# Patient Record
Sex: Female | Born: 1965
Health system: Southern US, Community
[De-identification: ages and names within clinical notes are randomized; demographics above are authoritative.]

## PROBLEM LIST (undated history)

## (undated) ENCOUNTER — Encounter

## (undated) ENCOUNTER — Telehealth

## (undated) ENCOUNTER — Ambulatory Visit
Payer: MEDICARE | Attending: Student in an Organized Health Care Education/Training Program | Primary: Student in an Organized Health Care Education/Training Program

## (undated) ENCOUNTER — Ambulatory Visit

## (undated) ENCOUNTER — Ambulatory Visit: Payer: MEDICARE

## (undated) ENCOUNTER — Encounter: Attending: Critical Care Medicine | Primary: Critical Care Medicine

## (undated) ENCOUNTER — Ambulatory Visit: Payer: MEDICARE | Attending: "Endocrinology | Primary: "Endocrinology

## (undated) ENCOUNTER — Encounter
Attending: Pharmacist Clinician (PhC)/ Clinical Pharmacy Specialist | Primary: Pharmacist Clinician (PhC)/ Clinical Pharmacy Specialist

## (undated) ENCOUNTER — Encounter: Attending: Adult Health | Primary: Adult Health

## (undated) ENCOUNTER — Telehealth: Attending: Pulmonary Disease | Primary: Pulmonary Disease

## (undated) ENCOUNTER — Ambulatory Visit: Attending: "Endocrinology | Primary: "Endocrinology

## (undated) ENCOUNTER — Telehealth
Attending: Pharmacist Clinician (PhC)/ Clinical Pharmacy Specialist | Primary: Pharmacist Clinician (PhC)/ Clinical Pharmacy Specialist

## (undated) ENCOUNTER — Telehealth: Attending: Critical Care Medicine | Primary: Critical Care Medicine

## (undated) DIAGNOSIS — R410 Disorientation, unspecified: Secondary | ICD-10-CM

## (undated) DIAGNOSIS — Z8709 Personal history of other diseases of the respiratory system: Secondary | ICD-10-CM

## (undated) DIAGNOSIS — R011 Cardiac murmur, unspecified: Secondary | ICD-10-CM

## (undated) DIAGNOSIS — I10 Essential (primary) hypertension: Secondary | ICD-10-CM

## (undated) DIAGNOSIS — F32A Depression, unspecified: Secondary | ICD-10-CM

## (undated) DIAGNOSIS — F329 Major depressive disorder, single episode, unspecified: Secondary | ICD-10-CM

## (undated) DIAGNOSIS — K769 Liver disease, unspecified: Secondary | ICD-10-CM

## (undated) DIAGNOSIS — F419 Anxiety disorder, unspecified: Secondary | ICD-10-CM

## (undated) DIAGNOSIS — E8809 Other disorders of plasma-protein metabolism, not elsewhere classified: Secondary | ICD-10-CM

## (undated) DIAGNOSIS — F1020 Alcohol dependence, uncomplicated: Secondary | ICD-10-CM

## (undated) DIAGNOSIS — N390 Urinary tract infection, site not specified: Secondary | ICD-10-CM

## (undated) DIAGNOSIS — K602 Anal fissure, unspecified: Secondary | ICD-10-CM

## (undated) DIAGNOSIS — K921 Melena: Secondary | ICD-10-CM

## (undated) DIAGNOSIS — K766 Portal hypertension: Secondary | ICD-10-CM

## (undated) DIAGNOSIS — K7460 Unspecified cirrhosis of liver: Secondary | ICD-10-CM

## (undated) DIAGNOSIS — K652 Spontaneous bacterial peritonitis: Secondary | ICD-10-CM

## (undated) DIAGNOSIS — K283 Acute gastrojejunal ulcer without hemorrhage or perforation: Secondary | ICD-10-CM

## (undated) HISTORY — DX: Essential (primary) hypertension: I10

## (undated) HISTORY — DX: Disorientation, unspecified: R41.0

## (undated) HISTORY — DX: Major depressive disorder, single episode, unspecified: F32.9

## (undated) HISTORY — DX: Anxiety disorder, unspecified: F41.9

## (undated) HISTORY — DX: Personal history of other diseases of the respiratory system: Z87.09

## (undated) HISTORY — PX: CHOLECYSTECTOMY: SHX55

## (undated) HISTORY — DX: Cardiac murmur, unspecified: R01.1

## (undated) HISTORY — DX: Depression, unspecified: F32.A

## (undated) HISTORY — DX: Anal fissure, unspecified: K60.2

## (undated) HISTORY — DX: Other disorders of plasma-protein metabolism, not elsewhere classified: E88.09

## (undated) HISTORY — DX: Acute gastrojejunal ulcer without hemorrhage or perforation: K28.3

## (undated) HISTORY — DX: Alcohol dependence, uncomplicated: F10.20

## (undated) HISTORY — DX: Melena: K92.1

## (undated) HISTORY — DX: Portal hypertension: K76.6

## (undated) HISTORY — DX: Liver disease, unspecified: K76.9

## (undated) HISTORY — DX: Spontaneous bacterial peritonitis: K65.2

## (undated) HISTORY — DX: Urinary tract infection, site not specified: N39.0

## (undated) MED ORDER — IBUPROFEN 400 MG TABLET: Freq: Four times a day (QID) | ORAL | 0 days | Status: SS | PRN

---
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
## 2007-09-27 HISTORY — PX: GASTRIC BYPASS: SHX52

## 2009-04-26 ENCOUNTER — Emergency Department: Payer: Self-pay | Admitting: Emergency Medicine

## 2009-07-27 ENCOUNTER — Emergency Department: Payer: Self-pay | Admitting: Emergency Medicine

## 2013-08-02 ENCOUNTER — Emergency Department: Payer: Self-pay | Admitting: Emergency Medicine

## 2014-05-20 ENCOUNTER — Emergency Department: Payer: Self-pay | Admitting: Emergency Medicine

## 2015-06-01 ENCOUNTER — Emergency Department
Admission: EM | Admit: 2015-06-01 | Discharge: 2015-06-01 | Disposition: A | Discharge status: Home / Self Care | Payer: 59 | Attending: Emergency Medicine | Admitting: Emergency Medicine

## 2015-06-01 ENCOUNTER — Encounter: Payer: Self-pay | Admitting: Emergency Medicine

## 2015-06-01 DIAGNOSIS — F19929 Other psychoactive substance use, unspecified with intoxication, unspecified: Secondary | ICD-10-CM

## 2015-06-01 DIAGNOSIS — F3289 Other specified depressive episodes: Secondary | ICD-10-CM

## 2015-06-01 DIAGNOSIS — F1011 Alcohol abuse, in remission: Secondary | ICD-10-CM

## 2015-06-01 DIAGNOSIS — F1994 Other psychoactive substance use, unspecified with psychoactive substance-induced mood disorder: Secondary | ICD-10-CM

## 2015-06-01 DIAGNOSIS — Z3202 Encounter for pregnancy test, result negative: Secondary | ICD-10-CM | POA: Insufficient documentation

## 2015-06-01 DIAGNOSIS — F10188 Alcohol abuse with other alcohol-induced disorder: Secondary | ICD-10-CM | POA: Insufficient documentation

## 2015-06-01 DIAGNOSIS — F329 Major depressive disorder, single episode, unspecified: Secondary | ICD-10-CM | POA: Diagnosis not present

## 2015-06-01 DIAGNOSIS — Z01812 Encounter for preprocedural laboratory examination: Secondary | ICD-10-CM | POA: Diagnosis present

## 2015-06-01 LAB — URINE DRUG SCREEN, QUALITATIVE (ARMC ONLY)
AMPHETAMINES, UR SCREEN: NOT DETECTED
Barbiturates, Ur Screen: NOT DETECTED
Benzodiazepine, Ur Scrn: NOT DETECTED
COCAINE METABOLITE, UR ~~LOC~~: NOT DETECTED
Cannabinoid 50 Ng, Ur ~~LOC~~: NOT DETECTED
MDMA (ECSTASY) UR SCREEN: NOT DETECTED
Methadone Scn, Ur: NOT DETECTED
Opiate, Ur Screen: NOT DETECTED
PHENCYCLIDINE (PCP) UR S: NOT DETECTED
TRICYCLIC, UR SCREEN: NOT DETECTED

## 2015-06-01 LAB — SALICYLATE LEVEL

## 2015-06-01 LAB — CBC
HCT: 45.8 % (ref 35.0–47.0)
HEMOGLOBIN: 15.2 g/dL (ref 12.0–16.0)
MCH: 31.9 pg (ref 26.0–34.0)
MCHC: 33.1 g/dL (ref 32.0–36.0)
MCV: 96.3 fL (ref 80.0–100.0)
PLATELETS: 200 10*3/uL (ref 150–440)
RBC: 4.76 MIL/uL (ref 3.80–5.20)
RDW: 14.4 % (ref 11.5–14.5)
WBC: 6.9 10*3/uL (ref 3.6–11.0)

## 2015-06-01 LAB — COMPREHENSIVE METABOLIC PANEL
ALBUMIN: 3.9 g/dL (ref 3.5–5.0)
ALK PHOS: 108 U/L (ref 38–126)
ALT: 80 U/L — ABNORMAL HIGH (ref 14–54)
ANION GAP: 8 (ref 5–15)
AST: 187 U/L — ABNORMAL HIGH (ref 15–41)
BUN: 6 mg/dL (ref 6–20)
CALCIUM: 9.1 mg/dL (ref 8.9–10.3)
CO2: 26 mmol/L (ref 22–32)
Chloride: 106 mmol/L (ref 101–111)
Creatinine, Ser: 0.57 mg/dL (ref 0.44–1.00)
GFR calc non Af Amer: >60 mL/min (ref >60)
Glucose, Bld: 93 mg/dL (ref 65–99)
POTASSIUM: 4.1 mmol/L (ref 3.5–5.1)
SODIUM: 140 mmol/L (ref 135–145)
Total Bilirubin: 0.4 mg/dL (ref 0.3–1.2)
Total Protein: 8.2 g/dL — ABNORMAL HIGH (ref 6.5–8.1)

## 2015-06-01 LAB — ACETAMINOPHEN LEVEL

## 2015-06-01 LAB — POCT PREGNANCY, URINE: Preg Test, Ur: NEGATIVE

## 2015-06-01 LAB — ETHANOL: Alcohol, Ethyl (B): 305 mg/dL (ref <5)

## 2015-06-01 MED ORDER — IBUPROFEN 600 MG PO TABS
600.0000 mg | ORAL_TABLET | Freq: Once | ORAL | Status: AC
  Administered 2015-06-01: 600 mg via ORAL

## 2015-06-01 MED ORDER — IBUPROFEN 600 MG PO TABS
ORAL_TABLET | ORAL | Status: AC
  Administered 2015-06-01: 600 mg via ORAL
  Filled 2015-06-01: qty 1

## 2015-06-01 NOTE — ED Notes (Signed)
BEHAVIORAL HEALTH ROUNDING Patient sleeping: No. Patient alert and oriented: yes Behavior appropriate: Yes.  ; If no, describe:  Nutrition and fluids offered: Yes  Toileting and hygiene offered: Yes  Sitter present: no Law enforcement present: Yes  

## 2015-06-01 NOTE — ED Notes (Signed)
BEHAVIORAL HEALTH ROUNDING Patient sleeping: No. Patient alert and oriented: yes Behavior appropriate: Yes.  ;  Nutrition and fluids offered: Yes  Toileting and hygiene offered: Yes  Sitter present: yes Law enforcement present: Yes  

## 2015-06-01 NOTE — ED Notes (Addendum)
CSW Claudine at bedside at this time attempting to contact any family or friends possible with no success.
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
## 2015-06-01 NOTE — ED Notes (Signed)
Julie Graham, behavioral intake, speaking with patient.

## 2015-06-01 NOTE — ED Notes (Signed)
BEHAVIORAL HEALTH ROUNDING Patient sleeping: No. Patient alert and oriented: yes Behavior appropriate: Yes.   Nutrition and fluids offered: Yes  Toileting and hygiene offered: yes Sitter present: yes Law enforcement present: Yes   

## 2015-06-01 NOTE — ED Notes (Signed)
Pt given phone at this time to call son. Son's # [phone].
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
## 2015-06-01 NOTE — ED Provider Notes (Signed)
-----------------------------------------   4:54 PM on 06/01/2015 -----------------------------------------   BP 115/69 mmHg  Pulse 86  Temp(Src) 97.8 F (36.6 C) (Oral)  Resp 18  Ht  (1.676 m)  Wt 248 lb (112.492 kg)  BMI 40.05 kg/m2  SpO2 99%  LMP 06/01/2015 (Exact Date)  I spoke in person with Dr. Jenne Campus who evaluated the patient and feels that the patient is safe to be discharged with outpatient follow-up.  The patient is hemodynamically stable and appropriate to go at this time.   Loleta Rose, MD 06/01/15 [phone]

## 2015-06-01 NOTE — BHH Counselor (Signed)
This Clinical research associate spoke directly with pt to retreive a contact number for collateral. She was unwilling to provide contact number for her son (who was present at the time of the alleged suicide attempt) stating "I don't know my son's number". She then asked if she could provide this Clinical research associate with "a friend's number". This writer told her "no, it would need to be your son or boyfriend's number". She provided the following number for her "boyrfriend" [phone] or [phone]" she was unable to recall which number was the accurate number.  This writer attempted to contact number [phone] and [phone]; both numbers were inactive (message: this number has been changed, disconnected, or no longer in service).

## 2015-06-01 NOTE — ED Notes (Addendum)
Per verbal order from MD [facility], keep pt in room and do not d/c until pt's boyfriend has arrived to pick pt up. Pt made aware and verbalized understanding at this time

## 2015-06-01 NOTE — Discharge Instructions (Signed)

## 2015-06-01 NOTE — ED Notes (Signed)

## 2015-06-01 NOTE — ED Notes (Signed)
MD Mcqueen resending IVC papers at this time, once handed back to RN, pt will be d/c
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
## 2015-06-01 NOTE — ED Notes (Signed)
Pt has been moved to room 24 out of hallway for privacy. Pt resting in room.

## 2015-06-01 NOTE — ED Notes (Signed)
Pt given RN's phone at this time to contact family. Pt using phone, tolerating well, calm and cooperative, no acute distress noted.

## 2015-06-01 NOTE — ED Notes (Addendum)
CSW claudine at bedside at this time, will d/c pt once CSW is finished speaking to pt and pt's family.

## 2015-06-01 NOTE — ED Notes (Signed)
BEHAVIORAL HEALTH ROUNDING Patient sleeping: Yes.   Patient alert and oriented: yes Behavior appropriate: Yes.  ; If no, describe:  Nutrition and fluids offered: Yes  Toileting and hygiene offered: Yes  Sitter present: no Law enforcement present: Yes  

## 2015-06-01 NOTE — Progress Notes (Signed)
LCSW met with patient to attempt to reach any family to cooberate she can return home with safety plan. Patient reported she is not suicidal or homicidal and needed to return to work ASAP by Tuesday. Several numbers were called and patient was not successful in reaching family.  Police office and other MHT were able to reach a distant couisin who could vouch for her and also provide family support as needed. LCSW was informed Docter also spoke to boyfriend and son and now there is no alcohol in the house. Disposition Note: LCSW spoke with patient and provided additional handouts, reviewed suicide prevention and intervention handout, AA meeting list, [place] and [place]. And Family Abuse services handout. Patient already has a therapist at [facility] and will follow up with psychiatrist Dr A this week. Patient was able to contract for safety and reported she will get the help she needs.patient denies any AH/VH /SI or HI at this time and will be released into the care of her cousin and his girlfriend.
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
## 2015-06-01 NOTE — BHH Counselor (Signed)
This writer attempted to contact with collateral to verify safety for possible discharge; each attempt was unsuccessful. This writer attempted to call the following numbers: [phone] (out of service), [phone] (listed as pt's home number; [employer] [place] is the answering message).

## 2015-12-21 ENCOUNTER — Emergency Department: Payer: 59

## 2015-12-21 ENCOUNTER — Encounter: Payer: Self-pay | Admitting: Emergency Medicine

## 2015-12-21 ENCOUNTER — Emergency Department
Admission: EM | Admit: 2015-12-21 | Discharge: 2015-12-21 | Disposition: A | Discharge status: Home / Self Care | Payer: 59 | Attending: Emergency Medicine | Admitting: Emergency Medicine

## 2015-12-21 DIAGNOSIS — S0083XA Contusion of other part of head, initial encounter: Secondary | ICD-10-CM

## 2015-12-21 DIAGNOSIS — Y939 Activity, unspecified: Secondary | ICD-10-CM | POA: Diagnosis not present

## 2015-12-21 DIAGNOSIS — Y999 Unspecified external cause status: Secondary | ICD-10-CM | POA: Insufficient documentation

## 2015-12-21 DIAGNOSIS — S40022A Contusion of left upper arm, initial encounter: Secondary | ICD-10-CM | POA: Insufficient documentation

## 2015-12-21 DIAGNOSIS — Y929 Unspecified place or not applicable: Secondary | ICD-10-CM | POA: Insufficient documentation

## 2015-12-21 DIAGNOSIS — F1029 Alcohol dependence with unspecified alcohol-induced disorder: Secondary | ICD-10-CM | POA: Insufficient documentation

## 2015-12-21 DIAGNOSIS — M79622 Pain in left upper arm: Secondary | ICD-10-CM | POA: Diagnosis present

## 2015-12-21 DIAGNOSIS — S20211A Contusion of right front wall of thorax, initial encounter: Secondary | ICD-10-CM | POA: Diagnosis not present

## 2015-12-21 LAB — POCT PREGNANCY, URINE: Preg Test, Ur: NEGATIVE

## 2015-12-21 MED ORDER — DIAZEPAM 5 MG PO TABS
5.0000 mg | ORAL_TABLET | Freq: Once | ORAL | Status: AC
  Administered 2015-12-21: 5 mg via ORAL
  Filled 2015-12-21: qty 1

## 2015-12-21 MED ORDER — IBUPROFEN 800 MG PO TABS: 800.0000 mg | ORAL_TABLET | Freq: Three times a day (TID) | ORAL | Status: DC | PRN

## 2015-12-21 MED ORDER — HYDROXYZINE PAMOATE 25 MG PO CAPS: 25.0000 mg | ORAL_CAPSULE | Freq: Three times a day (TID) | ORAL | Status: DC | PRN

## 2015-12-21 MED ORDER — CYCLOBENZAPRINE HCL 5 MG PO TABS: 5.0000 mg | ORAL_TABLET | Freq: Three times a day (TID) | ORAL | Status: DC | PRN
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
## 2015-12-21 NOTE — Discharge Instructions (Signed)
Chest Contusion A contusion is a deep bruise. Bruises happen when an injury causes bleeding under the skin. Signs of bruising include pain, puffiness (swelling), and discolored skin. The bruise may turn blue, purple, or yellow.  HOME CARE  Put ice on the injured area.  Put ice in a plastic bag.  Place a towel between the skin and the bag.  Leave the ice on for 15-20 minutes at a time, 03-04 times a day for the first 48 hours.  Only take medicine as told by your doctor.  Rest.  Take deep breaths (deep-breathing exercises) as told by your doctor.  Stop smoking if you smoke.  Do not lift objects over 5 pounds (2.3 kilograms) for 3 days or longer if told by your doctor. GET HELP RIGHT AWAY IF:   You have more bruising or puffiness.  You have pain that gets worse.  You have trouble breathing.  You are dizzy, weak, or pass out (faint).  You have blood in your pee (urine) or poop (stool).  You cough up or throw up (vomit) blood.  Your puffiness or pain is not helped with medicines. MAKE SURE YOU:   Understand these instructions.  Will watch your condition.  Will get help right away if you are not doing well or get worse.   This information is not intended to replace advice given to you by your health care provider. Make sure you discuss any questions you have with your health care provider.   Document Released: 02/29/2008 Document Revised: 06/06/2012 Document Reviewed: 03/05/2012 Elsevier Interactive Patient Education 2016 [employer].  General Assault Assault includes any behavior or physical attack--whether it is on purpose or not--that results in injury to another person, damage to property, or both. This also includes assault that has not yet happened, but is planned to happen. Threats of assault may be physical, verbal, or written. They may be said or sent by:  Mail.  E-mail.  Text.  Social media.  Fax. The threats may be direct, implied, or  understood. WHAT ARE THE DIFFERENT FORMS OF ASSAULT? Forms of assault include:  Physically assaulting a person. This includes physical threats to inflict physical harm as well as:  Slapping.  Hitting.  Poking.  Kicking.  Punching.  Pushing.  Sexually assaulting a person. Sexual assault is any sexual activity that a person is forced, threatened, or coerced to participate in. It may or may not involve physical contact with the person who is assaulting you. You are sexually assaulted if you are forced to have sexual contact of any kind.  Damaging or destroying a person's assistive equipment, such as glasses, canes, or walkers.  Throwing or hitting objects.  Using or displaying a weapon to harm or threaten someone.  Using or displaying an object that appears to be a weapon in a threatening manner.  Using greater physical size or strength to intimidate someone.  Making intimidating or threatening gestures.  Bullying.  Hazing.  Using language that is intimidating, threatening, hostile, or abusive.  Stalking.  Restraining someone with force. WHAT SHOULD I DO IF I EXPERIENCE ASSAULT?  Report assaults, threats, and stalking to the police. Call your local emergency services (911 in the [place] if you are in immediate danger or you need medical help.  You can work with a Clinical research associate or an advocate to get legal protection against someone who has assaulted you or threatened you with assault. Protection includes restraining orders and private addresses. Crimes against you, such as assault, can also  be prosecuted through the courts. Laws will vary depending on where you live.   This information is not intended to replace advice given to you by your health care provider. Make sure you discuss any questions you have with your health care provider.   Document Released: 09/12/2005 Document Revised: 10/03/2014 Document Reviewed: 05/30/2014 Elsevier Interactive Patient Education 2016 Elsevier  Inc.  Facial or Scalp Contusion A facial or scalp contusion is a deep bruise on the face or head. Injuries to the face and head generally cause a lot of swelling, especially around the eyes. Contusions are the result of an injury that caused bleeding under the skin. The contusion may turn blue, purple, or yellow. Minor injuries will give you a painless contusion, but more severe contusions may stay painful and swollen for a few weeks.  CAUSES  A facial or scalp contusion is caused by a blunt injury or trauma to the face or head area.  SIGNS AND SYMPTOMS   Swelling of the injured area.   Discoloration of the injured area.   Tenderness, soreness, or pain in the injured area.  DIAGNOSIS  The diagnosis can be made by taking a medical history and doing a physical exam. An X-ray exam, CT scan, or MRI may be needed to determine if there are any associated injuries, such as broken bones (fractures). TREATMENT  Often, the best treatment for a facial or scalp contusion is applying cold compresses to the injured area. Over-the-counter medicines may also be recommended for pain control.  HOME CARE INSTRUCTIONS   Only take over-the-counter or prescription medicines as directed by your health care provider.   Apply ice to the injured area.   Put ice in a plastic bag.   Place a towel between your skin and the bag.   Leave the ice on for 20 minutes, 2-3 times a day.  SEEK MEDICAL CARE IF:  You have bite problems.   You have pain with chewing.   You are concerned about facial defects. SEEK IMMEDIATE MEDICAL CARE IF:  You have severe pain or a headache that is not relieved by medicine.   You have unusual sleepiness, confusion, or personality changes.   You throw up (vomit).   You have a persistent nosebleed.   You have double vision or blurred vision.   You have fluid drainage from your nose or ear.   You have difficulty walking or using your arms or legs.  MAKE SURE  YOU:   Understand these instructions.  Will watch your condition.  Will get help right away if you are not doing well or get worse.   This information is not intended to replace advice given to you by your health care provider. Make sure you discuss any questions you have with your health care provider.   Document Released: 10/20/2004 Document Revised: 10/03/2014 Document Reviewed: 04/25/2013 Elsevier Interactive Patient Education 2016 Elsevier Inc.  Jaw Contusion A jaw contusion is a deep bruise of the jaw. Contusions are the result of an injury to muscles and tissue under the skin, which causes bleeding under the skin. The contusion may turn blue, purple, or yellow. Minor injuries will cause a painless contusion, but more severe contusions may stay painful and swollen for a few weeks. CAUSES This condition is usually caused by trauma, direct force, or a hard hit (blow) to the jaw. SYMPTOMS Symptoms of this condition include:  Jaw pain.  Jaw swelling.  Jaw bruising, redness, or discoloration.  Jaw tenderness or soreness. DIAGNOSIS  This condition is diagnosed with a medical history and a physical exam. An X-ray, CT scan, or MRI may be needed to determine if there were any associated injuries, such as broken bones (fractures). TREATMENT Often, the best treatment for this condition is applying cold compresses to the injured area and eating a soft diet. Over-the-counter medicines may also be recommended for pain control. HOME CARE INSTRUCTIONS Diet  Eat soft foods as told by your health care provider. Soft foods include baby food, gelatin, oatmeal, ice cream, applesauce, bananas, eggs, pasta, cottage cheese, soups, and yogurt.  Cut food into smaller pieces. This makes it easier to chew.  Avoid chewing gum or ice. General Instructions  If directed, apply ice to the injured area:  Put ice in a plastic bag.  Place a towel between your skin and the bag.  Leave the ice on for  20 minutes, 2-3 times per day.  Take over-the-counter and prescription medicines only as told by your health care provider.  Avoid opening your mouth widely. This includes opening your mouth to eat large pieces of food or to yawn, scream, yell, or sing.  Keep all follow-up visits as told by your health care provider. This is important. SEEK MEDICAL CARE IF:  Your pain is not controlled with medicine.  Your symptoms do not improve with treatment or they get worse.  You have new symptoms. SEEK IMMEDIATE MEDICAL CARE IF:  You have any new cracking or clicking in your jaw.  You have trouble eating or you cannot eat.   This information is not intended to replace advice given to you by your health care provider. Make sure you discuss any questions you have with your health care provider.   Document Released: 12/03/2003 Document Revised: 06/03/2015 Document Reviewed: 12/08/2014 Elsevier Interactive Patient Education 2016 Elsevier Inc.  Rib Contusion A rib contusion is a deep bruise on your rib area. Contusions are the result of a blunt trauma that causes bleeding and injury to the tissues under the skin. A rib contusion may involve bruising of the ribs and of the skin and muscles in the area. The skin overlying the contusion may turn blue, purple, or yellow. Minor injuries will give you a painless contusion, but more severe contusions may stay painful and swollen for a few weeks. CAUSES  A contusion is usually caused by a blow, trauma, or direct force to an area of the body. This often occurs while playing contact sports. SYMPTOMS  Swelling and redness of the injured area.  Discoloration of the injured area.  Tenderness and soreness of the injured area.  Pain with or without movement. DIAGNOSIS  The diagnosis can be made by taking a medical history and performing a physical exam. An X-ray, CT scan, or MRI may be needed to determine if there were any associated injuries, such as  broken bones (fractures) or internal injuries. TREATMENT  Often, the best treatment for a rib contusion is rest. Icing or applying cold compresses to the injured area may help reduce swelling and inflammation. Deep breathing exercises may be recommended to reduce the risk of partial lung collapse and pneumonia. Over-the-counter or prescription medicines may also be recommended for pain control. HOME CARE INSTRUCTIONS   Apply ice to the injured area:  Put ice in a plastic bag.  Place a towel between your skin and the bag.  Leave the ice on for 20 minutes, 2-3 times per day.  Take medicines only as directed by your health care provider.  Rest the injured area. Avoid strenuous activity and any activities or movements that cause pain. Be careful during activities and avoid bumping the injured area.  Perform deep-breathing exercises as directed by your health care provider.  Do not lift anything that is heavier than 5 lb (2.3 kg) until your health care provider approves.  Do not use any tobacco products, including cigarettes, chewing tobacco, or electronic cigarettes. If you need help quitting, ask your health care provider. SEEK MEDICAL CARE IF:   You have increased bruising or swelling.  You have pain that is not controlled with treatment.  You have a fever. SEEK IMMEDIATE MEDICAL CARE IF:   You have difficulty breathing or shortness of breath.  You develop a continual cough, or you cough up thick or bloody sputum.  You feel sick to your stomach (nauseous), you throw up (vomit), or you have abdominal pain.   This information is not intended to replace advice given to you by your health care provider. Make sure you discuss any questions you have with your health care provider.   Document Released: 06/07/2001 Document Revised: 10/03/2014 Document Reviewed: 06/24/2014 Elsevier Interactive Patient Education [employer] Elsevier Inc.
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
## 2016-02-10 ENCOUNTER — Ambulatory Visit (INDEPENDENT_AMBULATORY_CARE_PROVIDER_SITE_OTHER): Payer: 59 | Admitting: Family Medicine

## 2016-02-10 ENCOUNTER — Encounter: Payer: Self-pay | Admitting: Family Medicine

## 2016-02-10 VITALS — BP 124/82 | HR 94 | Temp 98.6°F | Ht 65.5 in | Wt 254.8 lb

## 2016-02-10 DIAGNOSIS — F419 Anxiety disorder, unspecified: Secondary | ICD-10-CM

## 2016-02-10 DIAGNOSIS — R1013 Epigastric pain: Secondary | ICD-10-CM

## 2016-02-10 DIAGNOSIS — R600 Localized edema: Secondary | ICD-10-CM | POA: Insufficient documentation

## 2016-02-10 DIAGNOSIS — R319 Hematuria, unspecified: Secondary | ICD-10-CM

## 2016-02-10 DIAGNOSIS — R0781 Pleurodynia: Secondary | ICD-10-CM | POA: Diagnosis not present

## 2016-02-10 DIAGNOSIS — R109 Unspecified abdominal pain: Secondary | ICD-10-CM | POA: Insufficient documentation

## 2016-02-10 LAB — POCT URINALYSIS DIPSTICK
Blood, UA: NEGATIVE
KETONES UA: NEGATIVE
Leukocytes, UA: NEGATIVE
Nitrite, UA: NEGATIVE
PH UA: 5.5
Urobilinogen, UA: 1
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
## 2016-02-10 NOTE — Patient Instructions (Signed)
Nice to meet you. Regarding obtain some lab work to determine the cause of your abdominal discomfort. We are going to send your urine for culture as well. Please let us know if you would like to start on anything for your anxiety. If you develop worsening abdominal discomfort, or shortness breath, chest pain, cough productive of blood, worsening anxiety, depression, thoughts of harming herself or others, or any new or changing symptoms please seek medical attention.
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
## 2016-02-10 NOTE — Assessment & Plan Note (Signed)
Has not improved since her ED evaluation. There is mild tenderness though no signs of flail chest or bony abnormalities on exam. Lung exam is normal. I offered repeat x-ray though she would like to continue to monitor. If does not continue to improve she will go get the x-ray. Given return precautions.

## 2016-02-10 NOTE — Progress Notes (Signed)
Pre visit review using our clinic review tool, if applicable. No additional management support is needed unless otherwise documented below in the visit note. 

## 2016-02-10 NOTE — Assessment & Plan Note (Signed)
Would seem consistent with venous insufficiency given improvement propping legs up and swelling being better in the morning. No CHF symptoms. We will obtain lab work as outlined below to evaluate this. She'll continue to monitor. Keep legs elevated. Given return precautions.
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
## 2016-02-10 NOTE — Assessment & Plan Note (Signed)
Patient reports pink reddish hue to her urine. No other symptoms of UTI. UA with no blood. Discoloration could be related to bilirubin. We'll send urine for culture to evaluate for infection. She'll continue to monitor. Given return precautions.

## 2016-02-11 ENCOUNTER — Telehealth: Payer: Self-pay | Admitting: Family Medicine

## 2016-02-11 DIAGNOSIS — R7989 Other specified abnormal findings of blood chemistry: Secondary | ICD-10-CM

## 2016-02-11 DIAGNOSIS — R945 Abnormal results of liver function studies: Principal | ICD-10-CM

## 2016-02-11 LAB — LIPASE: LIPASE: 39 U/L (ref 0–59)

## 2016-02-11 LAB — CBC
HEMOGLOBIN: 12.8 g/dL (ref 11.1–15.9)
Hematocrit: 37.7 % (ref 34.0–46.6)
MCH: 32.1 pg (ref 26.6–33.0)
MCHC: 34 g/dL (ref 31.5–35.7)
MCV: 95 fL (ref 79–97)
PLATELETS: 125 10*3/uL — AB (ref 150–379)
RBC: 3.99 x10E6/uL (ref 3.77–5.28)
RDW: 15.1 % (ref 12.3–15.4)
WBC: 6.5 10*3/uL (ref 3.4–10.8)

## 2016-02-11 LAB — COMPREHENSIVE METABOLIC PANEL
ALK PHOS: 153 IU/L — AB (ref 39–117)
ALT: 51 IU/L — AB (ref 0–32)
AST: 222 IU/L — AB (ref 0–40)
Albumin/Globulin Ratio: 0.6 — ABNORMAL LOW (ref 1.2–2.2)
Albumin: 2.6 g/dL — ABNORMAL LOW (ref 3.5–5.5)
BUN/Creatinine Ratio: 7 — ABNORMAL LOW (ref 9–23)
BUN: 4 mg/dL — ABNORMAL LOW (ref 6–24)
Bilirubin Total: 3.1 mg/dL — ABNORMAL HIGH (ref 0.0–1.2)
CALCIUM: 8.5 mg/dL — AB (ref 8.7–10.2)
CO2: 25 mmol/L (ref 18–29)
CREATININE: 0.55 mg/dL — AB (ref 0.57–1.00)
Chloride: 100 mmol/L (ref 96–106)
GFR calc Af Amer: 128 mL/min/{1.73_m2} (ref >59)
GFR, EST NON AFRICAN AMERICAN: 111 mL/min/{1.73_m2} (ref >59)
GLUCOSE: 90 mg/dL (ref 65–99)
Globulin, Total: 4.7 g/dL — ABNORMAL HIGH (ref 1.5–4.5)
Potassium: 3.8 mmol/L (ref 3.5–5.2)
SODIUM: 140 mmol/L (ref 134–144)
Total Protein: 7.3 g/dL (ref 6.0–8.5)

## 2016-02-11 LAB — TSH: TSH: 1.38 u[IU]/mL (ref 0.450–4.500)

## 2016-02-11 NOTE — Telephone Encounter (Signed)
Called and spoke with patient regarding lab results. Advised liver function studies and bilirubin being elevated. We will order an ultrasound to evaluate this further. She voiced understanding.

## 2016-02-12 LAB — URINE CULTURE
Colony Count: NO GROWTH
ORGANISM ID, BACTERIA: NO GROWTH

## 2016-02-16 ENCOUNTER — Ambulatory Visit
Admission: RE | Admit: 2016-02-16 | Discharge: 2016-02-16 | Disposition: A | Discharge status: Home / Self Care | Payer: 59 | Source: Ambulatory Visit | Attending: Family Medicine | Admitting: Family Medicine

## 2016-02-16 DIAGNOSIS — R7989 Other specified abnormal findings of blood chemistry: Secondary | ICD-10-CM | POA: Diagnosis not present

## 2016-02-16 DIAGNOSIS — S2231XA Fracture of one rib, right side, initial encounter for closed fracture: Secondary | ICD-10-CM | POA: Diagnosis not present

## 2016-02-16 DIAGNOSIS — R0781 Pleurodynia: Secondary | ICD-10-CM | POA: Insufficient documentation

## 2016-02-16 DIAGNOSIS — R945 Abnormal results of liver function studies: Secondary | ICD-10-CM

## 2016-02-16 DIAGNOSIS — R932 Abnormal findings on diagnostic imaging of liver and biliary tract: Secondary | ICD-10-CM | POA: Diagnosis not present

## 2016-02-17 ENCOUNTER — Telehealth: Payer: Self-pay | Admitting: Family Medicine

## 2016-02-17 NOTE — Telephone Encounter (Signed)
Pt was returning your call.   Call pt @ [phone] at work til 6pm Thank you!

## 2016-02-18 ENCOUNTER — Other Ambulatory Visit: Payer: Self-pay | Admitting: Family Medicine

## 2016-02-18 ENCOUNTER — Telehealth: Payer: Self-pay | Admitting: Family Medicine

## 2016-02-18 DIAGNOSIS — K7460 Unspecified cirrhosis of liver: Secondary | ICD-10-CM

## 2016-02-18 NOTE — Telephone Encounter (Signed)
Attempted to call patient. No answer. No voicemail set up. We will attempt to call her back tomorrow.

## 2016-02-18 NOTE — Telephone Encounter (Signed)
Pt wanted to ask an additional question? Pt wants to know if it's a directic that can be called in due to her retaining a lot of fluids?   Call pt @ [phone]. Thank you!

## 2016-02-18 NOTE — Telephone Encounter (Signed)
Please advise 

## 2016-02-18 NOTE — Telephone Encounter (Signed)
Notified patient of results 

## 2016-02-19 MED ORDER — FUROSEMIDE 20 MG PO TABS: 20.0000 mg | ORAL_TABLET | Freq: Every day | ORAL | Status: DC

## 2016-02-19 NOTE — Telephone Encounter (Signed)
Called and spoke with patient. She requests a diuretic due to her fluid accumulation. She did have swelling at her last visit. Suspect this is likely related to her liver disease. We will trial low-dose of Lasix. She also notes that she quit drinking 3 days ago. Had minimal jitteriness the first night though none since then. No shakiness or sweatiness. She had been decreasing the amount of drinking she been doing over the last month. Have been down to several glasses of wine a night. Notes she does not want to drink anymore and is just going to quit cold [place]. I warned her regarding alcohol withdrawal and symptoms to monitor for. Seizures would seem unlikely being greater than 48 hours out from her last drink. She will continue to monitor. I gave her return precautions.

## 2016-02-19 NOTE — Telephone Encounter (Signed)
Pt was returning your call.   Call pt @ [phone] work number she goes to break at [employer].   Thank you!

## 2016-02-19 NOTE — Telephone Encounter (Signed)
FYI patient has called three times today

## 2016-02-19 NOTE — Telephone Encounter (Signed)
Patient returned call, she will be off after 5 pm, today. Cell phone [phone]

## 2016-02-23 ENCOUNTER — Encounter: Payer: Self-pay | Admitting: *Deleted

## 2016-02-24 ENCOUNTER — Ambulatory Visit (INDEPENDENT_AMBULATORY_CARE_PROVIDER_SITE_OTHER): Payer: 59 | Admitting: Internal Medicine

## 2016-02-24 ENCOUNTER — Encounter: Payer: Self-pay | Admitting: Internal Medicine

## 2016-02-24 ENCOUNTER — Other Ambulatory Visit: Payer: 59

## 2016-02-24 VITALS — BP 136/76 | HR 84 | Ht 65.25 in | Wt 260.2 lb

## 2016-02-24 DIAGNOSIS — K7469 Other cirrhosis of liver: Secondary | ICD-10-CM | POA: Diagnosis not present

## 2016-02-24 DIAGNOSIS — R932 Abnormal findings on diagnostic imaging of liver and biliary tract: Secondary | ICD-10-CM | POA: Diagnosis not present

## 2016-02-24 DIAGNOSIS — K7460 Unspecified cirrhosis of liver: Secondary | ICD-10-CM

## 2016-02-24 DIAGNOSIS — R188 Other ascites: Secondary | ICD-10-CM

## 2016-02-24 DIAGNOSIS — F1010 Alcohol abuse, uncomplicated: Secondary | ICD-10-CM

## 2016-02-24 DIAGNOSIS — K7010 Alcoholic hepatitis without ascites: Secondary | ICD-10-CM

## 2016-02-24 DIAGNOSIS — R17 Unspecified jaundice: Secondary | ICD-10-CM

## 2016-02-24 MED ORDER — FUROSEMIDE 40 MG PO TABS: 40.0000 mg | ORAL_TABLET | ORAL | Status: DC

## 2016-02-24 MED ORDER — SPIRONOLACTONE 100 MG PO TABS: 100.0000 mg | ORAL_TABLET | ORAL | Status: DC

## 2016-02-24 NOTE — Patient Instructions (Addendum)
Your physician has requested that you go to the basement for lab work before leaving today.  You have been scheduled for an endoscopy. Please follow written instructions given to you at your visit today. If you use inhalers (even only as needed), please bring them with you on the day of your procedure. Your physician has requested that you go to www.startemmi.com and enter the access code given to you at your visit today. This web site gives a general overview about your procedure. However, you should still follow specific instructions given to you by our office regarding your preparation for the procedure.  We have sent the following medications to your pharmacy for you to pick up at your convenience: Lasix 40 mg every morning (increased from 20 mg dosage) Aldactone 100 mg every morning  Please follow a low sodium diet- Maximum 2 grams daily. We have given you a brochure regarding this.  Please refrain from all alcohol.  You have been scheduled for a follow up with Dr Rhea Belton on Monday,  03/07/16 @ 8:45 am.  You have been scheduled for a CT scan of the abdomen and pelvis at [facility] ([address]---this is in the same building as Architectural technologist).   You are scheduled on Monday 02/29/16 at 1:30 pm. You should arrive 15 minutes prior to your appointment time for registration. Please follow the written instructions below on the day of your exam:  WARNING: IF YOU ARE ALLERGIC TO IODINE/X-RAY DYE, PLEASE NOTIFY RADIOLOGY IMMEDIATELY AT [phone]! YOU WILL BE GIVEN A 13 HOUR PREMEDICATION PREP.  1) Do not eat or drink anything after 9:30 am (4 hours prior to your test) 2) You have been given 2 bottles of oral contrast to drink. The solution may taste better if refrigerated, but do NOT add ice or any other liquid to this solution. Shake well before drinking.    Drink 1 bottle of contrast @ 11:30 am (2 hours prior to your exam)  Drink 1 bottle of contrast @ 12:30 pm (1 hour  prior to your exam)  You may take any medications as prescribed with a small amount of water except for the following: Metformin, Glucophage, Glucovance, Avandamet, Riomet, Fortamet, Actoplus Met, Janumet, Glumetza or Metaglip. The above medications must be held the day of the exam AND 48 hours after the exam.  The purpose of you drinking the oral contrast is to aid in the visualization of your intestinal tract. The contrast solution may cause some diarrhea. Before your exam is started, you will be given a small amount of fluid to drink. Depending on your individual set of symptoms, you may also receive an intravenous injection of x-ray contrast/dye. Plan on being at [facility] for 30 minutes or longer, depending on the type of exam you are having performed.  This test typically takes 30-45 minutes to complete.  If you have any questions regarding your exam or if you need to reschedule, you may call the CT department at [phone] between the hours of 8:00 am and 5:00 pm, Monday-Friday.  ________________________________________________________________________  If you are age 31 or older, your body mass index should be between 23-30. Your Body mass index is 42.99 kg/(m^2). If this is out of the aforementioned range listed, please consider follow up with your Primary Care Provider.  If you are age 31 or younger, your body mass index should be between 19-25. Your Body mass index is 42.99 kg/(m^2). If this is out of the aformentioned range listed, please  consider follow up with your Primary Care Provider.

## 2016-02-26 ENCOUNTER — Telehealth: Payer: Self-pay | Admitting: Internal Medicine

## 2016-02-26 DIAGNOSIS — K7010 Alcoholic hepatitis without ascites: Secondary | ICD-10-CM

## 2016-02-26 DIAGNOSIS — R932 Abnormal findings on diagnostic imaging of liver and biliary tract: Secondary | ICD-10-CM

## 2016-02-26 LAB — CBC WITH DIFFERENTIAL/PLATELET
BASOS: 0 %
Basophils Absolute: 0 10*3/uL (ref 0.0–0.2)
EOS (ABSOLUTE): 0.1 10*3/uL (ref 0.0–0.4)
EOS: 1 %
HEMATOCRIT: 36.8 % (ref 34.0–46.6)
Hemoglobin: 12.3 g/dL (ref 11.1–15.9)
Immature Grans (Abs): 0 10*3/uL (ref 0.0–0.1)
Immature Granulocytes: 0 %
LYMPHS ABS: 1.4 10*3/uL (ref 0.7–3.1)
Lymphs: 22 %
MCH: 33 pg (ref 26.6–33.0)
MCHC: 33.4 g/dL (ref 31.5–35.7)
MCV: 99 fL — AB (ref 79–97)
MONOS ABS: 0.4 10*3/uL (ref 0.1–0.9)
Monocytes: 6 %
Neutrophils Absolute: 4.4 10*3/uL (ref 1.4–7.0)
Neutrophils: 71 %
PLATELETS: 143 10*3/uL — AB (ref 150–379)
RBC: 3.73 x10E6/uL — ABNORMAL LOW (ref 3.77–5.28)
RDW: 15.2 % (ref 12.3–15.4)
WBC: 6.3 10*3/uL (ref 3.4–10.8)

## 2016-02-26 LAB — COMPREHENSIVE METABOLIC PANEL
A/G RATIO: 0.5 — AB (ref 1.2–2.2)
ALK PHOS: 119 IU/L — AB (ref 39–117)
ALT: 52 IU/L — ABNORMAL HIGH (ref 0–32)
AST: 219 IU/L — AB (ref 0–40)
Albumin: 2.5 g/dL — ABNORMAL LOW (ref 3.5–5.5)
BILIRUBIN TOTAL: 6.7 mg/dL — AB (ref 0.0–1.2)
BUN/Creatinine Ratio: 19 (ref 9–23)
BUN: 12 mg/dL (ref 6–24)
CHLORIDE: 96 mmol/L (ref 96–106)
CO2: 24 mmol/L (ref 18–29)
Calcium: 8.5 mg/dL — ABNORMAL LOW (ref 8.7–10.2)
Creatinine, Ser: 0.62 mg/dL (ref 0.57–1.00)
GFR calc Af Amer: 123 mL/min/{1.73_m2} (ref >59)
GFR, EST NON AFRICAN AMERICAN: 107 mL/min/{1.73_m2} (ref >59)
GLOBULIN, TOTAL: 5.1 g/dL — AB (ref 1.5–4.5)
Glucose: 88 mg/dL (ref 65–99)
POTASSIUM: 3.4 mmol/L — AB (ref 3.5–5.2)
SODIUM: 137 mmol/L (ref 134–144)
Total Protein: 7.6 g/dL (ref 6.0–8.5)

## 2016-02-26 LAB — FERRITIN: FERRITIN: 403 ng/mL — AB (ref 15–150)

## 2016-02-26 LAB — IRON AND TIBC
IRON SATURATION: 62 % — AB (ref 15–55)
IRON: 78 ug/dL (ref 27–159)
Total Iron Binding Capacity: 125 ug/dL — ABNORMAL LOW (ref 250–450)
UIBC: 47 ug/dL — ABNORMAL LOW (ref 131–425)

## 2016-02-26 LAB — ANTI-SMOOTH MUSCLE ANTIBODY, IGG: SMOOTH MUSCLE AB: 69 U — AB (ref 0–19)

## 2016-02-26 LAB — PROTIME-INR
INR: 1.7 — ABNORMAL HIGH (ref 0.8–1.2)
Prothrombin Time: 17.2 s — ABNORMAL HIGH (ref 9.1–12.0)

## 2016-02-26 LAB — HEPATITIS B SURFACE ANTIBODY,QUALITATIVE: HEP B SURFACE AB, QUAL: NONREACTIVE

## 2016-02-26 LAB — HEPATITIS A ANTIBODY, TOTAL: Hep A Total Ab: NEGATIVE

## 2016-02-26 LAB — HEPATITIS C ANTIBODY: Hep C Virus Ab: 0.2 s/co ratio (ref 0.0–0.9)

## 2016-02-26 LAB — HEPATITIS B CORE ANTIBODY, TOTAL: Hep B Core Total Ab: NEGATIVE

## 2016-02-26 LAB — MITOCHONDRIAL ANTIBODIES: Mitochondrial Ab: 10.5 Units (ref 0.0–20.0)

## 2016-02-26 LAB — IGG: IgG (Immunoglobin G), Serum: 2153 mg/dL — ABNORMAL HIGH (ref 700–1600)

## 2016-02-26 LAB — ANA: ANA: NEGATIVE

## 2016-02-26 LAB — CERULOPLASMIN: Ceruloplasmin: 18.5 mg/dL — ABNORMAL LOW (ref 19.0–39.0)

## 2016-02-26 LAB — AFP TUMOR MARKER: AFP-Tumor Marker: 7.3 ng/mL (ref 0.0–8.3)

## 2016-02-26 LAB — HEPATITIS B SURFACE ANTIGEN: HEP B S AG: NEGATIVE

## 2016-02-26 MED ORDER — PREDNISOLONE 5 MG PO TABS: 40.0000 mg | ORAL_TABLET | Freq: Every day | ORAL | Status: DC
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
## 2016-02-26 NOTE — Telephone Encounter (Signed)
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 ANA negative Discriminate function 37.5 I'm concerned for alcoholic hepatitis, alcohol abuse related liver disease overlapping with autoimmune hepatitis Begin prednisolone 40 mg daily which will treat both conditions. I recommended liver biopsy, transjugular to her by phone. She is not willing to proceed with this but will think about it. Plan repeat labs Monday or Tuesday next week She has CT scan pending for Monday I advised she go to the ER for any change in condition over the weekend. She voiced understanding

## 2016-02-29 ENCOUNTER — Ambulatory Visit (INDEPENDENT_AMBULATORY_CARE_PROVIDER_SITE_OTHER)
Admission: RE | Admit: 2016-02-29 | Discharge: 2016-02-29 | Disposition: A | Discharge status: Home / Self Care | Payer: 59 | Source: Ambulatory Visit | Attending: Internal Medicine | Admitting: Internal Medicine

## 2016-02-29 DIAGNOSIS — K7010 Alcoholic hepatitis without ascites: Secondary | ICD-10-CM

## 2016-02-29 MED ORDER — IOPAMIDOL (ISOVUE-300) INJECTION 61%
100.0000 mL | Freq: Once | INTRAVENOUS | Status: AC | PRN
  Administered 2016-02-29: 100 mL via INTRAVENOUS

## 2016-02-29 NOTE — Telephone Encounter (Deleted)
Which labwork would you like her to have repeated?

## 2016-02-29 NOTE — Telephone Encounter (Signed)
-----   Message -----    From: Beverley FiedlerJay M Pyrtle, MD    Sent: 02/26/2016   5:39 PM      To: Richardson Chiquitoorothy N [place] Severs, CMA  Strongly positive anti-smooth muscle antibody and IgG CT scan of the abdomen pending for Monday Transjugular liver biopsy recommended Prednisolone initiated at 40 mg daily Needs hepatitis A and B vaccination eventually Repeat hepatic function panel, CMP and INR Monday or Tuesday next week through [facility]

## 2016-02-29 NOTE — Telephone Encounter (Signed)
We will give twinrix #1 at her appointment with us on 03/07/16. Labs orders have been created to send through [facility].

## 2016-02-29 NOTE — Telephone Encounter (Signed)
I have attempted to reach patient at home phone x 2 with no answer. No voicemail has been set up. I have also attempted to reach her at her work number and have been unsuccessful in getting her there either. I will contact her at a later time.

## 2016-02-29 NOTE — Addendum Note (Signed)
Addended by: Richardson ChiquitoSMITH, Greco Gastelum N on: 02/29/2016 09:11 AM   Modules accepted: Orders

## 2016-03-01 ENCOUNTER — Telehealth: Payer: Self-pay | Admitting: *Deleted

## 2016-03-01 NOTE — Telephone Encounter (Signed)
I have again attempted to contact patient but got no answer and no voicemail. I will again attempt to contact her at a later time.

## 2016-03-01 NOTE — Addendum Note (Signed)
Addended by: Richardson ChiquitoSMITH, Letica Giaimo N on: 03/01/2016 11:46 AM   Modules accepted: Orders

## 2016-03-01 NOTE — Telephone Encounter (Addendum)
I have spoken to patient to reiterate that she was to have labwork completed either yesterday or today and that order have been placed in our system to result to [facility] (where she works). However, she states that she is unable to come for labs today. She would have tests completed tomorrow if we can fax the orders to her. She states she will go to the [address] location. I have also advised that Dr Rhea BeltonPyrtle has gotten CT results back (as follows):   Notes Recorded by Beverley FiedlerJay M Pyrtle, MD on 02/29/2016 at 4:34 PM CT shows changes of portal HTN (ascites and dilated venous collaterals) Area in the liver needing further clarification (possible focal fatty sparing, but need to exclude a solid lesion) Proceed with MRI liver protocol (cirrhosis with portal HTN, abnl CT liver) Transjugular liver biopsy via radiology is strongly recommended (ETOH abuse, + ASMA, and elevated IGG) -- rule out AIH Needs EGD Repeat labs pending   I have given Dr Lauro FranklinPyrtle's interpretation and recommendations of CT result. She has agreed to go forward with MRI liver and keep appointment for EGD. However, she continues to be very reluctant to have transjugular liver biopsy. I have advised again that we strongly recommend this test. She states, "he will really have to twist my arm for that one." [facility] 03/08/16 @ 5:30 pm. She is to be npo 4 hours prior to the test with the exception of water. In addition, patient states that she is to have her FMLA forms filled out by 03/11/16. I advised that we have not gotten any forms to my knowledge. She indicates that she has not signed an ROI or given permission for any of her medical records to be released. I have given patient the number to [employer], [phone]) to see if they have received her FMLA paperwork. I have also advised her of time, date, location and prep for MRI. She verbalizes understanding of all of the above.

## 2016-03-01 NOTE — Telephone Encounter (Signed)
error 

## 2016-03-03 ENCOUNTER — Telehealth: Payer: Self-pay | Admitting: Internal Medicine

## 2016-03-03 NOTE — Telephone Encounter (Signed)
Please call in ondansetron 4 mg , one tablet by mouth twice daily as needed for nausea Disp #30 tabs, no refills

## 2016-03-03 NOTE — Telephone Encounter (Signed)
Acute alcoholic hepatitis - Primary     ICD-9-CM: 571.1 ICD-10-CM: K70.10    Abnormal liver diagnostic imaging     ICD-9-CM: 793.3 ICD-10-CM: R93.2    Alcohol abuse     ICD-9-CM: 305.00 ICD-10-CM: F10.10    Decompensated liver disease (HCC)     ICD-9-CM: 571.5 ICD-10-CM: K74.69    Jaundice     ICD-9-CM: 782.4 ICD-10-CM: R17    Ascites     ICD-9-CM: 789.59 ICD-10-CM: R18.8      Pyrtle pt calling stating that she has started having some nausea. Requesting something be called in for nausea. Dr. Myrtie Neitheranis as doc of the day please advise.

## 2016-03-04 ENCOUNTER — Ambulatory Visit: Payer: 59

## 2016-03-04 ENCOUNTER — Other Ambulatory Visit: Payer: Self-pay

## 2016-03-04 MED ORDER — ONDANSETRON HCL 4 MG PO TABS: 4.0000 mg | ORAL_TABLET | Freq: Two times a day (BID) | ORAL | Status: DC

## 2016-03-04 NOTE — Telephone Encounter (Signed)
Script sent to pharmacy, pt aware. 

## 2016-03-07 ENCOUNTER — Ambulatory Visit: Payer: 59 | Admitting: Internal Medicine

## 2016-03-07 ENCOUNTER — Telehealth: Payer: Self-pay | Admitting: *Deleted

## 2016-03-07 NOTE — Telephone Encounter (Signed)
Dr Rhea BeltonPyrtle has received patient's lab results (CMP, Hepatic panel) done at [facility] draw station (per patient request since she works for [facility]). We ordered an INR as well but unfortunately, this test was never completed. Per Dr Rhea BeltonPyrtle, "TBili better. Continue prednisolone 40 mg daily x 28 days. Repeat CMP, INR, CBC, IgG in 2 weeks. Patient no showed visit today. Needs to be rescheduled." I have spoken to patient to advise of Dr Lauro FranklinPyrtle's recommendations. She states that she forgot all about her appointment today in light of the multiple other appointments that she has. I have rescheduled her to 4:15 pm on 03/21/16 as she has multiple in depth questions for Dr Rhea BeltonPyrtle. She has asked to cancel her endoscopy on 03/24/16 because she will need more detailed information from Dr Rhea BeltonPyrtle prior to doing a procedure. I explained that I will leave her on the endoscopy schedule for now and we can cancel the procedure when she comes for her 03/21/16 appointment if both she and Dr Rhea BeltonPyrtle agree to it at that time. I have explained that everything we are doing is absolute necessity and that it is in her best interest to have the recommended course of treatment. She relays that she has financial stress and job stress from medical bills and multiple missed days at work for medical appointments. She will discuss in more detail her questions with Dr Rhea BeltonPyrtle at 03/21/16 visit and will also have her labwork completed that day. She is also advised to keep appointment for MRI liver tomorrow.

## 2016-03-08 ENCOUNTER — Inpatient Hospital Stay: Admission: RE | Admit: 2016-03-08 | Payer: 59 | Source: Ambulatory Visit

## 2016-03-09 ENCOUNTER — Encounter: Payer: Self-pay | Admitting: Internal Medicine

## 2016-03-11 NOTE — Telephone Encounter (Signed)
Patient went to wrong location for MRI. She requests to be rescheduled for the same day as her endoscopy. She has been rescheduled to 9 am for MRI on 03/24/16 and she will also keep her endoscopy scheduled. I have left a voicemail for patient to call back.

## 2016-03-14 ENCOUNTER — Encounter: Payer: 59 | Admitting: Family Medicine

## 2016-03-15 ENCOUNTER — Encounter: Payer: Self-pay | Admitting: Internal Medicine

## 2016-03-15 NOTE — Telephone Encounter (Signed)
I have again attempted to contact patient to advise of MRI. No answer on work phone or home number. Voicemail box at home number not set up.

## 2016-03-15 NOTE — Telephone Encounter (Signed)
I have spoken to patient to advise that I have rescheduled MRI to 03/24/16 same day as her Endoscopy as per her request. She now states that she would rather do the test a different day because she needs to work some that day before procedure and the 9 am appointment time is too much of a gap before 2 pm. I advised that [facility] set the test up this way so if the MRI ran over, it would not interfere with the time that she needs to be present for our test. I gave her the number to [facility] to reschedule as I have already scheduled test multiple times and have tried to accommodate her with no success. She is advised to call and set up MRI for a date that works for her. I asked her to do this before we fill out FMLA forms.

## 2016-03-17 ENCOUNTER — Telehealth: Payer: Self-pay | Admitting: Internal Medicine

## 2016-03-17 ENCOUNTER — Ambulatory Visit: Payer: 59 | Admitting: Family Medicine

## 2016-03-17 DIAGNOSIS — Z0289 Encounter for other administrative examinations: Secondary | ICD-10-CM

## 2016-03-17 NOTE — Telephone Encounter (Signed)
I have attempted to call patient back but got no answer or voicemail. The appointment was actually an old appointment that I neglected to take off the schedule. Dr Rhea BeltonPyrtle plans to complete endoscopy first and we will discuss office visit after that.

## 2016-03-21 ENCOUNTER — Ambulatory Visit: Payer: 59 | Admitting: Internal Medicine

## 2016-03-24 ENCOUNTER — Other Ambulatory Visit: Payer: 59

## 2016-03-24 ENCOUNTER — Encounter: Payer: Self-pay | Admitting: Internal Medicine

## 2016-03-24 ENCOUNTER — Inpatient Hospital Stay: Admission: RE | Admit: 2016-03-24 | Payer: 59 | Source: Ambulatory Visit

## 2016-03-24 ENCOUNTER — Ambulatory Visit (AMBULATORY_SURGERY_CENTER): Payer: 59 | Admitting: Internal Medicine

## 2016-03-24 ENCOUNTER — Other Ambulatory Visit: Payer: Self-pay | Admitting: Internal Medicine

## 2016-03-24 VITALS — BP 159/90 | HR 103 | Temp 98.4°F | Resp 17 | Ht 65.0 in | Wt 260.0 lb

## 2016-03-24 DIAGNOSIS — K7460 Unspecified cirrhosis of liver: Secondary | ICD-10-CM

## 2016-03-24 DIAGNOSIS — K319 Disease of stomach and duodenum, unspecified: Secondary | ICD-10-CM | POA: Diagnosis not present

## 2016-03-24 DIAGNOSIS — K7010 Alcoholic hepatitis without ascites: Secondary | ICD-10-CM

## 2016-03-24 MED ORDER — SODIUM CHLORIDE 0.9 % IV SOLN: 500.0000 mL | INTRAVENOUS | Status: DC

## 2016-03-24 MED ORDER — OMEPRAZOLE 40 MG PO CPDR: 40.0000 mg | DELAYED_RELEASE_CAPSULE | Freq: Two times a day (BID) | ORAL | Status: DC

## 2016-03-24 NOTE — Op Note (Signed)
[facility] Patient Name: Waymon Amatonna Davidian Procedure Date: 03/24/2016 2:47 PM MRN: [id number] Endoscopist: Beverley FiedlerJay M Emmeline Winebarger , MD Age: [age] Referring MD:  Date of Birth: 1966/08/14 Gender: Female Account #: [id number] Procedure:                Upper GI endoscopy Indications:              Screening procedure for esophageal varices in                            patient with portal hypertension Medicines:                Monitored Anesthesia Care Procedure:                Pre-Anesthesia Assessment:                           - Prior to the procedure, a History and Physical                            was performed, and patient medications and                            allergies were reviewed. The patient's tolerance of                            previous anesthesia was also reviewed. The risks                            and benefits of the procedure and the sedation                            options and risks were discussed with the patient.                            All questions were answered, and informed consent                            was obtained. Prior Anticoagulants: The patient has                            taken no previous anticoagulant or antiplatelet                            agents. ASA Grade Assessment: III - A patient with                            severe systemic disease. After reviewing the risks                            and benefits, the patient was deemed in                            satisfactory condition to undergo the procedure.  After obtaining informed consent, the endoscope was                            passed under direct vision. Throughout the                            procedure, the patient's blood pressure, pulse, and                            oxygen saturations were monitored continuously. The                            Model GIF-HQ190 [id number]) scope was introduced                            through the mouth, and  advanced to the efferent                            jejunal loop. The upper GI endoscopy was                            accomplished without difficulty. The patient                            tolerated the procedure well. Scope In: Scope Out: Findings:                 The examined esophagus was normal.                           There is no endoscopic evidence of varices in the                            entire esophagus.                           Evidence of a Roux-en-Y gastrojejunostomy was                            found. The gastrojejunal anastomosis was                            characterized by large ulceration with visible                            staples. This was traversed. The pouch was 6 cm in                            length and characterized by erythema. Multiple                            biopsies from the gastric pouch.                           The examined efferent jejunal limb was normal. Complications:  No immediate complications. Estimated Blood Loss:     Estimated blood loss was minimal. Impression:               - Normal esophagus.                           - Roux-en-Y gastrojejunostomy with gastrojejunal                            anastomosis characterized by large ulceration.                           - Mild gastritis in the pouch. Biopsied.                           - Normal efferent jejunal limb.                           - No specimens collected. Recommendation:           - Patient has a contact number available for                            emergencies. The signs and symptoms of potential                            delayed complications were discussed with the                            patient. Return to normal activities tomorrow.                            Written discharge instructions were provided to the                            patient.                           - Resume previous diet.                           - Continue present  medications.                           - No aspirin, ibuprofen, naproxen, or other                            non-steroidal anti-inflammatory drugs.                           - Await pathology results.                           - Repeat upper endoscopy in 12 weeks for                            surveillance.                           -  Start Prilosec (omeprazole) 40 mg by mouth twice                            daily (open capsules and sprinkle on apple sauce or                            pudding to increase absorption given Roux-en-Y                            anatomy).                           - Repeat CMP, INR, CBC today. If liver enzymes                            improving will plan to taper prednisolone. Beverley Fiedler, MD 03/24/2016 3:14:13 PM This report has been signed electronically.

## 2016-03-24 NOTE — Progress Notes (Signed)
Report to PACU, RN, vss, BBS= Clear.  

## 2016-03-24 NOTE — Progress Notes (Signed)
Called to room to assist during endoscopic procedure.  Patient ID and intended procedure confirmed with present staff. Received instructions for my participation in the procedure from the performing physician.  

## 2016-03-24 NOTE — Progress Notes (Signed)
Lab tech came to endoscopy unit to draw labs.

## 2016-03-24 NOTE — Patient Instructions (Signed)
YOU HAD AN ENDOSCOPIC PROCEDURE TODAY AT THE [facility]:   Refer to the procedure report that was given to you for any specific questions about what was found during the examination.  If the procedure report does not answer your questions, please call your gastroenterologist to clarify.  If you requested that your care partner not be given the details of your procedure findings, then the procedure report has been included in a sealed envelope for you to review at your convenience later.  YOU SHOULD EXPECT: Some feelings of bloating in the abdomen. Passage of more gas than usual.  Walking can help get rid of the air that was put into your GI tract during the procedure and reduce the bloating. If you had a lower endoscopy (such as a colonoscopy or flexible sigmoidoscopy) you may notice spotting of blood in your stool or on the toilet paper. If you underwent a bowel prep for your procedure, you may not have a normal bowel movement for a few days.  Please Note:  You might notice some irritation and congestion in your nose or some drainage.  This is from the oxygen used during your procedure.  There is no need for concern and it should clear up in a day or so.  SYMPTOMS TO REPORT IMMEDIATELY:     Following upper endoscopy (EGD)  Vomiting of blood or coffee ground material  New chest pain or pain under the shoulder blades  Painful or persistently difficult swallowing  New shortness of breath  Fever of 100F or higher  Black, tarry-looking stools  For urgent or emergent issues, a gastroenterologist can be reached at any hour by calling [phone].   DIET: Your first meal following the procedure should be a small meal and then it is ok to progress to your normal diet. Heavy or fried foods are harder to digest and may make you feel nauseous or bloated.  Likewise, meals heavy in dairy and vegetables can increase bloating.  Drink plenty of fluids but you should avoid alcoholic beverages  for 24 hours.  ACTIVITY:  You should plan to take it easy for the rest of today and you should NOT DRIVE or use heavy machinery until tomorrow (because of the sedation medicines used during the test).    FOLLOW UP: Our staff will call the number listed on your records the next business day following your procedure to check on you and address any questions or concerns that you may have regarding the information given to you following your procedure. If we do not reach you, we will leave a message.  However, if you are feeling well and you are not experiencing any problems, there is no need to return our call.  We will assume that you have returned to your regular daily activities without incident.  If any biopsies were taken you will be contacted by phone or by letter within the next 1-3 weeks.  Please call us at [phone] if you have not heard about the biopsies in 3 weeks.    SIGNATURES/CONFIDENTIALITY: You and/or your care partner have signed paperwork which will be entered into your electronic medical record.  These signatures attest to the fact that that the information above on your After Visit Summary has been reviewed and is understood.  Full responsibility of the confidentiality of this discharge information lies with you and/or your care-partner.  No aspirin,ibuprofen,naproxen,non-steroidal or anti-inflammatory drugs. Resume remainder of medications. Information given on gastritis.

## 2016-03-25 ENCOUNTER — Telehealth: Payer: Self-pay | Admitting: Gastroenterology

## 2016-03-25 ENCOUNTER — Telehealth: Payer: Self-pay

## 2016-03-25 LAB — COMPREHENSIVE METABOLIC PANEL
ALBUMIN: 2.9 g/dL — AB (ref 3.5–5.5)
ALK PHOS: 113 IU/L (ref 39–117)
ALT: 49 IU/L — ABNORMAL HIGH (ref 0–32)
AST: 71 IU/L — ABNORMAL HIGH (ref 0–40)
Albumin/Globulin Ratio: 0.6 — ABNORMAL LOW (ref 1.2–2.2)
BILIRUBIN TOTAL: 3 mg/dL — AB (ref 0.0–1.2)
BUN / CREAT RATIO: 12 (ref 9–23)
BUN: 7 mg/dL (ref 6–24)
CHLORIDE: 99 mmol/L (ref 96–106)
CO2: 19 mmol/L (ref 18–29)
Calcium: 8.4 mg/dL — ABNORMAL LOW (ref 8.7–10.2)
Creatinine, Ser: 0.57 mg/dL (ref 0.57–1.00)
GFR calc Af Amer: 127 mL/min/{1.73_m2} (ref >59)
GFR calc non Af Amer: 110 mL/min/{1.73_m2} (ref >59)
GLOBULIN, TOTAL: 4.8 g/dL — AB (ref 1.5–4.5)
Glucose: 35 mg/dL — CL (ref 65–99)
Potassium: 3.4 mmol/L — ABNORMAL LOW (ref 3.5–5.2)
SODIUM: 139 mmol/L (ref 134–144)
Total Protein: 7.7 g/dL (ref 6.0–8.5)

## 2016-03-25 LAB — CBC WITH DIFFERENTIAL
BASOS ABS: 0 10*3/uL (ref 0.0–0.2)
BASOS: 0 %
EOS (ABSOLUTE): 0 10*3/uL (ref 0.0–0.4)
Eos: 1 %
HEMOGLOBIN: 11.5 g/dL (ref 11.1–15.9)
Hematocrit: 33 % — ABNORMAL LOW (ref 34.0–46.6)
Immature Grans (Abs): 0 10*3/uL (ref 0.0–0.1)
Immature Granulocytes: 0 %
LYMPHS ABS: 1.8 10*3/uL (ref 0.7–3.1)
Lymphs: 23 %
MCH: 32.5 pg (ref 26.6–33.0)
MCHC: 34.8 g/dL (ref 31.5–35.7)
MCV: 93 fL (ref 79–97)
MONOCYTES: 8 %
Monocytes Absolute: 0.6 10*3/uL (ref 0.1–0.9)
NEUTROS ABS: 5.1 10*3/uL (ref 1.4–7.0)
Neutrophils: 68 %
RBC: 3.54 x10E6/uL — ABNORMAL LOW (ref 3.77–5.28)
RDW: 15.4 % (ref 12.3–15.4)
WBC: 7.6 10*3/uL (ref 3.4–10.8)

## 2016-03-25 LAB — PROTIME-INR
INR: 1.5 — ABNORMAL HIGH (ref 0.8–1.2)
Prothrombin Time: 15.6 s — ABNORMAL HIGH (ref 9.1–12.0)

## 2016-03-25 NOTE — Telephone Encounter (Signed)
Lab called this am at 6 with critical result of glucose 35, unclear if the sample was processed immediately or there was an issue. Called patient , didn't reach her and could leave a messge either as voice mail was not set up.

## 2016-03-25 NOTE — Telephone Encounter (Signed)
I called [phone] and it ran several times, then a recording said a voice mail box has not been set up.  Unable to leave a message. maw

## 2016-03-25 NOTE — Telephone Encounter (Signed)
Spoke with pt and she is at work and feels fine. Appears sample was not run immediately.

## 2016-03-28 ENCOUNTER — Other Ambulatory Visit: Payer: Self-pay | Admitting: *Deleted

## 2016-03-28 ENCOUNTER — Telehealth: Payer: Self-pay | Admitting: *Deleted

## 2016-03-28 MED ORDER — PREDNISOLONE 5 MG PO TABS: ORAL_TABLET | ORAL | Status: DC

## 2016-03-28 NOTE — Telephone Encounter (Signed)
Patient states the Zofran is not helping her nausea. Please, advise.

## 2016-03-29 NOTE — Telephone Encounter (Signed)
Discontinue zofran Trial of promethazine 12.5 - 25 mg every 6-8 hrs PRN nausea.  Make her aware this can cause sleepiness and she should not use and drive Continue BID PPI given anastomotic ulcer seen recently.  Open PPI and sprinkle on applesauce to administer each dose

## 2016-03-30 MED ORDER — PROMETHAZINE HCL 12.5 MG PO TABS: ORAL_TABLET | ORAL | Status: DC

## 2016-03-30 NOTE — Telephone Encounter (Signed)
Spoke with pt and she is aware. Script sent to pharmacy for pt. °

## 2016-04-01 ENCOUNTER — Telehealth: Payer: Self-pay | Admitting: Internal Medicine

## 2016-04-01 ENCOUNTER — Encounter: Payer: Self-pay | Admitting: Internal Medicine

## 2016-04-01 NOTE — Telephone Encounter (Signed)
Spoke to Roe at [facility] regarding prednisolone question. He states rx was sent for #240 but with the way rx is written, patient would need 300. Advised ok for quantity of 300.

## 2016-04-04 ENCOUNTER — Telehealth: Payer: Self-pay | Admitting: *Deleted

## 2016-04-04 NOTE — Telephone Encounter (Signed)
Please advise and schedule, thanks

## 2016-04-04 NOTE — Telephone Encounter (Signed)
Patient would like be seen by Dr Birdie SonsSonnenberg this week for Anxiety and cough, please advise a 30 min office visit time and date for this patient.

## 2016-04-06 ENCOUNTER — Ambulatory Visit (INDEPENDENT_AMBULATORY_CARE_PROVIDER_SITE_OTHER): Payer: 59 | Admitting: Family Medicine

## 2016-04-06 ENCOUNTER — Encounter: Payer: Self-pay | Admitting: Family Medicine

## 2016-04-06 VITALS — BP 138/76 | HR 98 | Temp 98.0°F | Ht 65.25 in | Wt 249.0 lb

## 2016-04-06 DIAGNOSIS — K7011 Alcoholic hepatitis with ascites: Secondary | ICD-10-CM | POA: Diagnosis not present

## 2016-04-06 DIAGNOSIS — E162 Hypoglycemia, unspecified: Secondary | ICD-10-CM | POA: Diagnosis not present

## 2016-04-06 DIAGNOSIS — K7030 Alcoholic cirrhosis of liver without ascites: Secondary | ICD-10-CM | POA: Insufficient documentation

## 2016-04-06 DIAGNOSIS — F419 Anxiety disorder, unspecified: Secondary | ICD-10-CM | POA: Insufficient documentation

## 2016-04-06 DIAGNOSIS — F329 Major depressive disorder, single episode, unspecified: Secondary | ICD-10-CM

## 2016-04-06 DIAGNOSIS — F418 Other specified anxiety disorders: Secondary | ICD-10-CM | POA: Diagnosis not present

## 2016-04-06 NOTE — Assessment & Plan Note (Addendum)
Patient with anxiety and depression surrounding her current illness and home situation. Suspect patient had anxiety attack. Given symptomatology I did discuss obtaining an EKG to evaluate for cardiac cause though she declined this at this time. She's not had any recurrence. Discussed potential treatments with therapy or medication. She declined therapy. Discussed potential for liver damage with most typical antidepressants. On review it appears that a number of SSRIs require decreased dosing due to concern for hepatic dysfunction. Review article reviewed stating that these are the preferred medications for depression in people with liver dysfunction. Given potential for liver dysfunction on these medications patient deferred this at this time and will research Celexa to see if she would like to start on this. She is given return precautions. Will continue to monitor and if there is recurrence of her anxiety attack symptoms that do not resolve quickly she will seek medical attention. Given return precautions.

## 2016-04-06 NOTE — Assessment & Plan Note (Signed)
Patient felt to have alcoholic hepatitis when seeing GI. She will continue to follow with GI for this. Has less abdominal swelling per her report.. Encouraged her to remain abstinent from alcohol. She will continue to monitor.

## 2016-04-06 NOTE — Progress Notes (Signed)
Pre visit review using our clinic review tool, if applicable. No additional management support is needed unless otherwise documented below in the visit note. 

## 2016-04-06 NOTE — Patient Instructions (Signed)
Nice to see you. I provided you with some information on Celexa. Please review this and decide if you would like to take this for your depression. Please continue to monitor for anxiety attacks. If you have persistent chest pain, palpitations, shortness of breath, recurrent anxiety attacks, thoughts of harming herself or others, or any new or change in symptoms please seek medical attention.
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
## 2016-04-06 NOTE — Assessment & Plan Note (Signed)
We will have her check a CMP through [facility] to follow-up on this. Suspect this was related to not having eaten.

## 2016-04-08 ENCOUNTER — Other Ambulatory Visit: Payer: 59

## 2016-04-11 ENCOUNTER — Other Ambulatory Visit: Payer: 59

## 2016-04-13 ENCOUNTER — Telehealth: Payer: Self-pay | Admitting: Internal Medicine

## 2016-04-13 DIAGNOSIS — K7031 Alcoholic cirrhosis of liver with ascites: Secondary | ICD-10-CM

## 2016-04-13 NOTE — Telephone Encounter (Signed)
Pt states she is miserable, feels like she is about to pop. Reports her belly is very distended and uncomfortable, belly is tight. Pt is taking lasix 40mg  daily and aldactone 100mg  daily. Pt wants to know what can be done, would like to avoid paracentesis if at all possible. Please advise.

## 2016-04-14 NOTE — Telephone Encounter (Signed)
Need to repeat CBC, CMP, INR Abd US and paracentesis up to 6 L if ascites is present with 50g IV albumin if paracentesis is performed May be able to increase diuretics, but need labs 1st Low Na diet very important

## 2016-04-14 NOTE — Telephone Encounter (Signed)
Left message for pt to call back.  Pt aware. States she works at [employer] and wants labs done there. Pt to call back with fax number to send orders for labs.

## 2016-04-14 NOTE — Telephone Encounter (Signed)
Pt calling back regarding abdominal swelling/discomfort. Please advise.

## 2016-04-14 NOTE — Telephone Encounter (Signed)
Pt is requesting a call back @ [phone]

## 2016-04-15 ENCOUNTER — Telehealth: Payer: Self-pay | Admitting: Family Medicine

## 2016-04-15 NOTE — Telephone Encounter (Signed)
Spoke to patient. She stated she does want to go forward with anxiety/depression med.  She says she was unable to research more about Celexa, but did go over print outs and would like for MD to Rx what he thinks will be best.  Advised would fwd message. Patient agreed.

## 2016-04-15 NOTE — Telephone Encounter (Signed)
Orders faxed to [facility]

## 2016-04-15 NOTE — Telephone Encounter (Signed)
Spoke with the patient, she is taking the omeprazole for her ulcer.  Please advise, thanks

## 2016-04-15 NOTE — Telephone Encounter (Signed)
Please check with the patient to see if she is still taking omeprazole prior to me sending in the celexa. Thanks.

## 2016-04-15 NOTE — Telephone Encounter (Signed)
Pt called about wanting Dr Birdie SonsSonnenberg to call in medication for Anxiety/depression. Pt did not know the name of the medication.  [id number] [place], [place] - [address]  Call pt @ [phone] until 5p. Or [phone]. Thank you!

## 2016-04-15 NOTE — Telephone Encounter (Signed)
Pt called back w/Fax number for [facility] [phone]. Planning to go Friday am

## 2016-04-16 LAB — CBC WITH DIFFERENTIAL/PLATELET
BASOS ABS: 0.1 10*3/uL (ref 0.0–0.2)
Basos: 1 %
EOS (ABSOLUTE): 0.1 10*3/uL (ref 0.0–0.4)
Eos: 1 %
HEMOGLOBIN: 11.1 g/dL (ref 11.1–15.9)
Hematocrit: 31.1 % — ABNORMAL LOW (ref 34.0–46.6)
Immature Grans (Abs): 0 10*3/uL (ref 0.0–0.1)
Immature Granulocytes: 0 %
LYMPHS ABS: 2.2 10*3/uL (ref 0.7–3.1)
LYMPHS: 29 %
MCH: 32.6 pg (ref 26.6–33.0)
MCHC: 35.7 g/dL (ref 31.5–35.7)
MCV: 92 fL (ref 79–97)
MONOCYTES: 12 %
Monocytes Absolute: 0.9 10*3/uL (ref 0.1–0.9)
NEUTROS ABS: 4.3 10*3/uL (ref 1.4–7.0)
Neutrophils: 57 %
PLATELETS: 184 10*3/uL (ref 150–379)
RBC: 3.4 x10E6/uL — AB (ref 3.77–5.28)
RDW: 13.2 % (ref 12.3–15.4)
WBC: 7.5 10*3/uL (ref 3.4–10.8)

## 2016-04-16 LAB — COMPREHENSIVE METABOLIC PANEL
ALBUMIN: 2.4 g/dL — AB (ref 3.5–5.5)
ALK PHOS: 114 IU/L (ref 39–117)
ALT: 28 IU/L (ref 0–32)
AST: 62 IU/L — AB (ref 0–40)
Albumin/Globulin Ratio: 0.5 — ABNORMAL LOW (ref 1.2–2.2)
BILIRUBIN TOTAL: 4.8 mg/dL — AB (ref 0.0–1.2)
BUN / CREAT RATIO: 13 (ref 9–23)
BUN: 9 mg/dL (ref 6–24)
CHLORIDE: 97 mmol/L (ref 96–106)
CO2: 23 mmol/L (ref 18–29)
Calcium: 8.3 mg/dL — ABNORMAL LOW (ref 8.7–10.2)
Creatinine, Ser: 0.71 mg/dL (ref 0.57–1.00)
GFR calc Af Amer: 116 mL/min/{1.73_m2} (ref >59)
GFR calc non Af Amer: 101 mL/min/{1.73_m2} (ref >59)
GLUCOSE: 72 mg/dL (ref 65–99)
Globulin, Total: 4.5 g/dL (ref 1.5–4.5)
Potassium: 4 mmol/L (ref 3.5–5.2)
Sodium: 136 mmol/L (ref 134–144)
Total Protein: 6.9 g/dL (ref 6.0–8.5)

## 2016-04-16 LAB — PROTIME-INR
INR: 1.9 — AB (ref 0.8–1.2)
PROTHROMBIN TIME: 19.7 s — AB (ref 9.1–12.0)

## 2016-04-18 ENCOUNTER — Other Ambulatory Visit: Payer: Self-pay | Admitting: Internal Medicine

## 2016-04-18 DIAGNOSIS — K7460 Unspecified cirrhosis of liver: Secondary | ICD-10-CM

## 2016-04-18 DIAGNOSIS — R188 Other ascites: Principal | ICD-10-CM

## 2016-04-18 NOTE — Addendum Note (Signed)
Addended by: Richardson Chiquito on: 04/18/2016 02:45 PM   Modules accepted: Orders

## 2016-04-18 NOTE — Telephone Encounter (Signed)
US paracentesis has been ordered with up to 6 liters fluid to be drawn off abdomen with 50 units albumin IV if fluid is removed. Per Dr Lauro Franklin verbal orders, fluid should be sent for cell count, albumin, total protein. This will be completed at [facility]  (go to admitting first) on Wednesday, 04/20/16 @ 1:00 pm (12:30 pm arrival). Patient has been advised of this and verbalizes understanding. She is also advised to continue refraining from alcohol and remain on a low sodium (2g or less) diet. She is to have repeat labs next week which I have placed orders for.

## 2016-04-19 ENCOUNTER — Ambulatory Visit
Admission: RE | Admit: 2016-04-19 | Discharge: 2016-04-19 | Disposition: A | Discharge status: Home / Self Care | Payer: 59 | Source: Ambulatory Visit | Attending: Internal Medicine | Admitting: Internal Medicine

## 2016-04-19 DIAGNOSIS — K7010 Alcoholic hepatitis without ascites: Secondary | ICD-10-CM

## 2016-04-19 DIAGNOSIS — R932 Abnormal findings on diagnostic imaging of liver and biliary tract: Secondary | ICD-10-CM

## 2016-04-19 MED ORDER — GADOXETATE DISODIUM 0.25 MOL/L IV SOLN
10.0000 mL | Freq: Once | INTRAVENOUS | Status: AC | PRN
Start: 2016-04-19 — End: 2016-04-19
  Administered 2016-04-19: 10 mL via INTRAVENOUS

## 2016-04-20 ENCOUNTER — Ambulatory Visit (HOSPITAL_COMMUNITY)
Admission: RE | Admit: 2016-04-20 | Discharge: 2016-04-20 | Disposition: A | Discharge status: Home / Self Care | Payer: 59 | Source: Ambulatory Visit | Attending: Internal Medicine | Admitting: Internal Medicine

## 2016-04-20 DIAGNOSIS — K7031 Alcoholic cirrhosis of liver with ascites: Secondary | ICD-10-CM | POA: Insufficient documentation

## 2016-04-20 LAB — PROTEIN, BODY FLUID

## 2016-04-20 LAB — BODY FLUID CELL COUNT WITH DIFFERENTIAL
Eos, Fluid: 0 %
Lymphs, Fluid: 32 %
MONOCYTE-MACROPHAGE-SEROUS FLUID: 52 % (ref 50–90)
Neutrophil Count, Fluid: 16 % (ref 0–25)
WBC FLUID: 141 uL (ref 0–1000)

## 2016-04-20 LAB — ALBUMIN, FLUID (OTHER): Albumin, Fluid: <1 g/dL

## 2016-04-20 MED ORDER — LIDOCAINE HCL (PF) 1 % IJ SOLN
INTRAMUSCULAR | Status: AC
  Filled 2016-04-20: qty 10

## 2016-04-20 MED ORDER — LIDOCAINE HCL (PF) 1 % IJ SOLN
INTRAMUSCULAR | Status: AC
  Filled 2016-04-20: qty 5

## 2016-04-20 NOTE — Telephone Encounter (Signed)
Pt called to follow up on the Celexa? Please advise? Pt also wanted to follow up on the disability paperwork to [facility] sure it went to the right place.   [id number] - [place], [place] - [address]  Call pt @ [phone]. Thank you!

## 2016-04-20 NOTE — Telephone Encounter (Signed)
Asher Muir please advise, thanks

## 2016-04-20 NOTE — Procedures (Signed)
Successful US guided paracentesis from [facility].  Yielded 4 liters of clear yellow fluid.  No immediate complications.  Pt tolerated well.   Specimen was sent for labs.  Dorianna Mckiver S Cabell Lazenby PA-C 04/20/2016 1:09 PM

## 2016-04-20 NOTE — Telephone Encounter (Signed)
Notified patient that doctor is still trying to decide if the Celexa is the right medication for her. She also stated that Dr. Rhea Belton is who is filling out the disability forms.

## 2016-04-21 ENCOUNTER — Telehealth: Payer: Self-pay

## 2016-04-21 LAB — PATHOLOGIST SMEAR REVIEW

## 2016-04-21 MED ORDER — CITALOPRAM HYDROBROMIDE 10 MG PO TABS: 10.0000 mg | ORAL_TABLET | Freq: Every day | ORAL | 3 refills | Status: DC

## 2016-04-21 MED ORDER — TRAMADOL HCL 50 MG PO TABS: 50.0000 mg | ORAL_TABLET | Freq: Three times a day (TID) | ORAL | 0 refills | Status: DC | PRN

## 2016-04-21 NOTE — Telephone Encounter (Signed)
We must be extremely cautious with pain medications which can be sedating given her cirrhosis Okay for tramadol 50-100 mg every 8 hours as needed.  #20 Call back if pain not improving or worsening Thanks

## 2016-04-21 NOTE — Telephone Encounter (Signed)
Dr Rhea Belton I called the patient with her recent MRI results and change in dosage of diuretics.  She advised that after her paracentesis she has been very "sore" in her abdomen.   She can barely sit up in bed.  She states that she has taken tylenol and it doesn't help.  She would like something for pain called in.  Please advise

## 2016-04-21 NOTE — Telephone Encounter (Signed)
Pt notified and prescription has been faxed to the pharmacy

## 2016-04-21 NOTE — Telephone Encounter (Signed)
I'll send in celexa 10 mg. Discussed with pharmacist and noted this should be appropriate. Patient will need to have liver function monitored while on this after being on this for 2-3 weeks.

## 2016-04-22 ENCOUNTER — Telehealth: Payer: Self-pay | Admitting: *Deleted

## 2016-04-22 NOTE — Telephone Encounter (Signed)
LM for patient to return call.

## 2016-04-22 NOTE — Telephone Encounter (Signed)
Left voicemail for patient to remind her to have her labwork completed at [facility] on Monday. Orders have already been sent to [facility].

## 2016-04-25 ENCOUNTER — Telehealth: Payer: Self-pay | Admitting: Internal Medicine

## 2016-04-25 ENCOUNTER — Observation Stay
Admission: EM | Admit: 2016-04-25 | Discharge: 2016-04-27 | Disposition: A | Discharge status: Home / Self Care | Payer: 59 | Attending: Specialist | Admitting: Specialist

## 2016-04-25 DIAGNOSIS — F32A Depression, unspecified: Secondary | ICD-10-CM | POA: Diagnosis present

## 2016-04-25 DIAGNOSIS — Z801 Family history of malignant neoplasm of trachea, bronchus and lung: Secondary | ICD-10-CM | POA: Diagnosis not present

## 2016-04-25 DIAGNOSIS — Z8249 Family history of ischemic heart disease and other diseases of the circulatory system: Secondary | ICD-10-CM | POA: Insufficient documentation

## 2016-04-25 DIAGNOSIS — F1020 Alcohol dependence, uncomplicated: Secondary | ICD-10-CM | POA: Insufficient documentation

## 2016-04-25 DIAGNOSIS — Z803 Family history of malignant neoplasm of breast: Secondary | ICD-10-CM | POA: Diagnosis not present

## 2016-04-25 DIAGNOSIS — Z823 Family history of stroke: Secondary | ICD-10-CM | POA: Diagnosis not present

## 2016-04-25 DIAGNOSIS — F329 Major depressive disorder, single episode, unspecified: Secondary | ICD-10-CM | POA: Diagnosis not present

## 2016-04-25 DIAGNOSIS — E65 Localized adiposity: Secondary | ICD-10-CM | POA: Diagnosis not present

## 2016-04-25 DIAGNOSIS — K652 Spontaneous bacterial peritonitis: Secondary | ICD-10-CM | POA: Insufficient documentation

## 2016-04-25 DIAGNOSIS — Z9884 Bariatric surgery status: Secondary | ICD-10-CM | POA: Insufficient documentation

## 2016-04-25 DIAGNOSIS — Z807 Family history of other malignant neoplasms of lymphoid, hematopoietic and related tissues: Secondary | ICD-10-CM | POA: Insufficient documentation

## 2016-04-25 DIAGNOSIS — I10 Essential (primary) hypertension: Secondary | ICD-10-CM | POA: Diagnosis not present

## 2016-04-25 DIAGNOSIS — Z832 Family history of diseases of the blood and blood-forming organs and certain disorders involving the immune mechanism: Secondary | ICD-10-CM | POA: Insufficient documentation

## 2016-04-25 DIAGNOSIS — Z79899 Other long term (current) drug therapy: Secondary | ICD-10-CM | POA: Insufficient documentation

## 2016-04-25 DIAGNOSIS — Z8261 Family history of arthritis: Secondary | ICD-10-CM | POA: Insufficient documentation

## 2016-04-25 DIAGNOSIS — Z811 Family history of alcohol abuse and dependence: Secondary | ICD-10-CM | POA: Diagnosis not present

## 2016-04-25 DIAGNOSIS — Z9889 Other specified postprocedural states: Secondary | ICD-10-CM | POA: Insufficient documentation

## 2016-04-25 DIAGNOSIS — K219 Gastro-esophageal reflux disease without esophagitis: Secondary | ICD-10-CM | POA: Insufficient documentation

## 2016-04-25 DIAGNOSIS — Z825 Family history of asthma and other chronic lower respiratory diseases: Secondary | ICD-10-CM | POA: Insufficient documentation

## 2016-04-25 DIAGNOSIS — B372 Candidiasis of skin and nail: Secondary | ICD-10-CM | POA: Diagnosis not present

## 2016-04-25 DIAGNOSIS — D72829 Elevated white blood cell count, unspecified: Secondary | ICD-10-CM | POA: Diagnosis present

## 2016-04-25 DIAGNOSIS — F419 Anxiety disorder, unspecified: Secondary | ICD-10-CM | POA: Diagnosis not present

## 2016-04-25 DIAGNOSIS — K7030 Alcoholic cirrhosis of liver without ascites: Secondary | ICD-10-CM | POA: Diagnosis present

## 2016-04-25 DIAGNOSIS — Z79891 Long term (current) use of opiate analgesic: Secondary | ICD-10-CM | POA: Insufficient documentation

## 2016-04-25 DIAGNOSIS — Z882 Allergy status to sulfonamides status: Secondary | ICD-10-CM | POA: Insufficient documentation

## 2016-04-25 DIAGNOSIS — Z9049 Acquired absence of other specified parts of digestive tract: Secondary | ICD-10-CM | POA: Insufficient documentation

## 2016-04-25 DIAGNOSIS — R109 Unspecified abdominal pain: Secondary | ICD-10-CM

## 2016-04-25 DIAGNOSIS — R1084 Generalized abdominal pain: Secondary | ICD-10-CM | POA: Diagnosis present

## 2016-04-25 DIAGNOSIS — K7031 Alcoholic cirrhosis of liver with ascites: Secondary | ICD-10-CM | POA: Diagnosis not present

## 2016-04-25 DIAGNOSIS — R188 Other ascites: Secondary | ICD-10-CM

## 2016-04-25 DIAGNOSIS — X58XXXA Exposure to other specified factors, initial encounter: Secondary | ICD-10-CM | POA: Diagnosis not present

## 2016-04-25 DIAGNOSIS — Z8241 Family history of sudden cardiac death: Secondary | ICD-10-CM | POA: Insufficient documentation

## 2016-04-25 DIAGNOSIS — T8130XA Disruption of wound, unspecified, initial encounter: Secondary | ICD-10-CM | POA: Insufficient documentation

## 2016-04-25 DIAGNOSIS — Z8349 Family history of other endocrine, nutritional and metabolic diseases: Secondary | ICD-10-CM | POA: Insufficient documentation

## 2016-04-25 HISTORY — DX: Unspecified cirrhosis of liver: K74.60

## 2016-04-25 LAB — CBC WITH DIFFERENTIAL/PLATELET
BAND NEUTROPHILS: 0 %
BASOS ABS: 0 10*3/uL (ref 0–0.1)
BASOS PCT: 0 %
Blasts: 0 %
EOS ABS: 0.2 10*3/uL (ref 0–0.7)
EOS PCT: 2 %
HCT: 29.5 % — ABNORMAL LOW (ref 35.0–47.0)
Hemoglobin: 10.4 g/dL — ABNORMAL LOW (ref 12.0–16.0)
LYMPHS ABS: 3.3 10*3/uL (ref 1.0–3.6)
Lymphocytes Relative: 27 %
MCH: 33.5 pg (ref 26.0–34.0)
MCHC: 35.1 g/dL (ref 32.0–36.0)
MCV: 95.4 fL (ref 80.0–100.0)
METAMYELOCYTES PCT: 0 %
MONO ABS: 1.1 10*3/uL — AB (ref 0.2–0.9)
MONOS PCT: 9 %
MYELOCYTES: 0 %
NEUTROS ABS: 7.5 10*3/uL — AB (ref 1.4–6.5)
Neutrophils Relative %: 62 %
Other: 0 %
PLATELETS: 138 10*3/uL — AB (ref 150–440)
Promyelocytes Absolute: 0 %
RBC: 3.09 MIL/uL — ABNORMAL LOW (ref 3.80–5.20)
RDW: 14.1 % (ref 11.5–14.5)
WBC: 12.1 10*3/uL — ABNORMAL HIGH (ref 3.6–11.0)
nRBC: 0 /100 WBC

## 2016-04-25 LAB — COMPREHENSIVE METABOLIC PANEL
ALBUMIN: 2.1 g/dL — AB (ref 3.5–5.0)
ALT: 42 U/L (ref 14–54)
AST: 126 U/L — AB (ref 15–41)
Alkaline Phosphatase: 105 U/L (ref 38–126)
Anion gap: 7 (ref 5–15)
BUN: 26 mg/dL — AB (ref 6–20)
CHLORIDE: 97 mmol/L — AB (ref 101–111)
CO2: 27 mmol/L (ref 22–32)
Calcium: 8 mg/dL — ABNORMAL LOW (ref 8.9–10.3)
Creatinine, Ser: 0.85 mg/dL (ref 0.44–1.00)
GFR calc Af Amer: >60 mL/min (ref >60)
Glucose, Bld: 96 mg/dL (ref 65–99)
POTASSIUM: 3.5 mmol/L (ref 3.5–5.1)
SODIUM: 131 mmol/L — AB (ref 135–145)
Total Bilirubin: 4.6 mg/dL — ABNORMAL HIGH (ref 0.3–1.2)
Total Protein: 6.8 g/dL (ref 6.5–8.1)

## 2016-04-25 LAB — LIPASE, BLOOD: LIPASE: 51 U/L (ref 11–51)

## 2016-04-25 LAB — MAGNESIUM: MAGNESIUM: 2 mg/dL (ref 1.7–2.4)

## 2016-04-25 MED ORDER — VANCOMYCIN HCL IN DEXTROSE 1-5 GM/200ML-% IV SOLN: 1000.0000 mg | Freq: Once | INTRAVENOUS | Status: DC

## 2016-04-25 MED ORDER — ONDANSETRON HCL 4 MG/2ML IJ SOLN
4.0000 mg | INTRAMUSCULAR | Status: AC
  Administered 2016-04-26: 4 mg via INTRAVENOUS
  Filled 2016-04-25: qty 2

## 2016-04-25 MED ORDER — DEXTROSE 5 % IV SOLN: 2.0000 g | INTRAVENOUS | Status: DC

## 2016-04-25 MED ORDER — NYSTATIN 100000 UNIT/GM EX CREA
TOPICAL_CREAM | CUTANEOUS | Status: AC
  Administered 2016-04-25: 1 via TOPICAL
  Filled 2016-04-25: qty 15

## 2016-04-25 MED ORDER — MORPHINE SULFATE (PF) 4 MG/ML IV SOLN
4.0000 mg | Freq: Once | INTRAVENOUS | Status: AC
  Administered 2016-04-26: 4 mg via INTRAVENOUS
  Filled 2016-04-25: qty 1

## 2016-04-25 MED ORDER — DEXTROSE 5 % IV SOLN
1.0000 g | INTRAVENOUS | Status: AC
  Administered 2016-04-26: 1 g via INTRAVENOUS
  Filled 2016-04-25: qty 10

## 2016-04-25 NOTE — Telephone Encounter (Signed)
See additional phone note. 

## 2016-04-25 NOTE — ED Provider Notes (Signed)
[facility] Emergency Department Provider Note  ____________________________________________   First MD Initiated Contact with Patient 04/25/16 2051     (approximate)  I have reviewed the triage vital signs and the nursing notes.   HISTORY  Chief Complaint Wound Check    HPI Julie Graham is a 50 y.o. female with a history of cirrhosis and large volume ascites who is followed by a GI doctor in [place].  She had a large volume paracentesis last week where they removed 4 L and her fluid studies were normal.  She has had stable blood work in the past with a white blood cell count consistently around 7.  She presents for evaluation today of leakage of yellow fluid from a wound below her belly button at the location of her prior cesarean section scar.  She states that this started within the last 24 hours and has been leaking a moderate amount although it is currently leaking less than before.  At the same time she has felt an increase of her chronic abdominal pain, worse with any amount of movement, better slightly with rest, and she describes it as generalized and an aching pain throughout.  She denies fever/chills, chest pain, shortness of breath, nausea, vomiting, diarrhea, dysuria.   Past Medical History:  Diagnosis Date  . Alcoholism (HCC)   . Anal fissure    ?  Marland Kitchen Anxiety   . Blood in stool   . Cirrhosis (HCC)   . Depression   . Heart murmur   . Hypertension   . UTI (lower urinary tract infection)     Patient Active Problem List   Diagnosis Date Noted  . Leukocytosis 04/26/2016  . Hypoglycemia 04/06/2016  . Alcoholic cirrhosis (HCC) 04/06/2016  . Anxiety and depression 04/06/2016  . Hematuria 02/10/2016  . Anxiety 02/10/2016  . Rib pain on right side 02/10/2016  . Abdominal pain 02/10/2016  . Bilateral lower extremity edema 02/10/2016  . Alcohol use disorder, severe, dependence (HCC) 06/01/2015  . 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 CESAREAN SECTION  1989  . CHOLECYSTECTOMY    . GASTRIC BYPASS  2009    Prior to Admission medications   Medication Sig Start Date End Date Taking? Authorizing Provider  acetaminophen (TYLENOL) 500 MG tablet Take 500-1,000 mg by mouth as needed.    Historical Provider, MD  citalopram (CELEXA) 10 MG tablet Take 1 tablet (10 mg total) by mouth daily. 04/21/16   Glori Luis, MD  furosemide (LASIX) 40 MG tablet Take 1 tablet (40 mg total) by mouth every morning. 02/24/16   Beverley Fiedler, MD  omeprazole (PRILOSEC) 40 MG capsule Take 1 capsule (40 mg total) by mouth 2 (two) times daily. 03/24/16 03/24/16  Beverley Fiedler, MD  promethazine (PHENERGAN) 12.5 MG tablet Take 1-2 tablets by mouth every 6-8 hours as needed for nausea 03/30/16   Beverley Fiedler, MD  spironolactone (ALDACTONE) 100 MG tablet Take 1 tablet (100 mg total) by mouth every morning. 02/24/16   Beverley Fiedler, MD  traMADol (ULTRAM) 50 MG tablet Take 1-2 tablets (50-100 mg total) by mouth every 8 (eight) hours as needed for moderate pain. 04/21/16   Beverley Fiedler, MD    Allergies Sulfa antibiotics  Family History  Problem Relation Age of Onset  . Alcoholism Maternal Grandfather   . Stroke Maternal Grandfather   . Arthritis Mother   .  Hyperlipidemia Mother   . Hypertension Mother   . Stroke Mother   . Lymphoma Mother   . Heart failure Mother   . COPD Mother   . Diabetes Mother   . Breast cancer Cousin   . Heart disease Father     CHF  . Diabetes Father   . Arthritis Father   . Factor V Leiden deficiency Father   . Hyperlipidemia Father   . Hypertension Father   . COPD Father   . Sudden death Paternal Uncle   . Lung cancer Maternal Grandmother     Social History Social History  Substance Use Topics  . Smoking status: Never Smoker  . Smokeless tobacco: Never Used  . Alcohol use No     Comment: stopped  drinking 02/16/16    Review of Systems Constitutional: No fever/chills Eyes: No visual changes. ENT: No sore throat. Cardiovascular: Denies chest pain. Respiratory: Denies shortness of breath. Gastrointestinal: +abdominal pain.  No nausea, no vomiting.  No diarrhea.  No constipation. Genitourinary: Negative for dysuria. Musculoskeletal: Negative for back pain. Skin: Fluid leaking from her decades old C-section scar Neurological: Negative for headaches, focal weakness or numbness.  10-point ROS otherwise negative.  ____________________________________________   PHYSICAL EXAM:  VITAL SIGNS: ED Triage Vitals [04/25/16 1810]  Enc Vitals Group     BP (!) 148/78     Pulse Rate 90     Resp 16     Temp 97.7 F (36.5 C)     Temp Source Oral     SpO2 100 %     Weight 249 lb (112.9 kg)     Height      Head Circumference      Peak Flow      Pain Score 5     Pain Loc      Pain Edu?      Excl. in GC?     Constitutional: Morbidly obese.  Alert and oriented.  No acute distress Eyes: Conjunctivae are normal. PERRL. EOMI. Head: Atraumatic. Nose: No congestion/rhinnorhea. Mouth/Throat: Mucous membranes are moist.  Oropharynx non-erythematous. Neck: No stridor.  No meningeal signs.   Cardiovascular: Borderline tachycardia, regular rhythm. Good peripheral circulation. Grossly normal heart sounds.   Respiratory: Normal respiratory effort.  No retractions. Lungs CTAB. Gastrointestinal: Morbidly obese.  Soft with diffuse tenderness throughout abdomen.  See skin exam for more details Musculoskeletal: No lower extremity tenderness nor edema. No gross deformities of extremities. Neurologic:  Normal speech and language. No gross focal neurologic deficits are appreciated.  Skin: The patient has an irritated and "gooey" area of localized infection, most likely a yeast infection by its appearance, located at the site of her prior C-section scar.  There is a small amount of fluid leaking from a  hole within the scar tissue.  Additionally she has extensive intertriginous skin excoriation underneath her pannus also consistent with yeast infection.  She also has extensive subacute bruising on the left side of her lower abdomen.  There is no obvious wound and no obvious source of infection from the location she tells me where the prior large volume paracentesis was performed. Psychiatric: Mood and affect are normal. Speech and behavior are normal.  ____________________________________________   LABS (all labs ordered are listed, but only abnormal results are displayed)  Labs Reviewed  CBC WITH DIFFERENTIAL/PLATELET - Abnormal; Notable for the following:       Result Value   WBC 12.1 (*)    RBC 3.09 (*)    Hemoglobin 10.4 (*)  HCT 29.5 (*)    Platelets 138 (*)    Neutro Abs 7.5 (*)    Monocytes Absolute 1.1 (*)    All other components within normal limits  COMPREHENSIVE METABOLIC PANEL - Abnormal; Notable for the following:    Sodium 131 (*)    Chloride 97 (*)    BUN 26 (*)    Calcium 8.0 (*)    Albumin 2.1 (*)    AST 126 (*)    Total Bilirubin 4.6 (*)    All other components within normal limits  LIPASE, BLOOD  MAGNESIUM   ____________________________________________  EKG  None ____________________________________________  RADIOLOGY   No results found.  ____________________________________________   PROCEDURES  Procedure(s) performed:   Procedures   ____________________________________________   INITIAL IMPRESSION / ASSESSMENT AND PLAN / ED COURSE  Pertinent labs & imaging results that were available during my care of the patient were reviewed by me and considered in my medical decision making (see chart for details).  Given the presence of a leaking wound at the site of an old cesarean section scar which could potentially introduce bacteria from the outside into her ascites, I called and had an extensive conversation with Dr. Midge Minium.  In  short, he felt it was unlikely that she would develop a secondary peritonitis based on the presence of this wound as the intra-abdominal pressure would cause the fluid to leak out but much less likely the bacteria would be introduced inside.  Our plan was that if her labs were essentially unchanged and her pain was essentially unchanged from chronic discomfort, I would hold off on performing a diagnostic paracentesis to avoid possible contamination of her ascites given the very low risk of peritonitis.  We would place a sterile urine bag over the oozing wound that she could follow-up with her gastroenterologist.  However, her blood work was notable for a leukocytosis of greater than 12 which is changed from the last time her blood work was obtained last week.  She is also complaining of worsening abdominal pain particularly with movement.  Given the leukocytosis, I evaluated her with a bedside ultrasound extensively to see if I could safely perform a diagnostic paracentesis.  Although I could observe some fluid collections, unfortunately I could not find a single large volume pocket where I felt comfortable introducing a needle without significant risk of bowel perforation.  The study and procedure are limited by her habitus and I felt that the risk of bowel perforation was too great to proceed.  As a result I discussed with the patient and with the hospitalist the plan to treat her empirically for possible peritonitis in the setting of worsening abdominal pain and leukocytosis and she will come into the hospital and likely have a paracentesis performed under fluoroscopy tomorrow.  Everyone is in agreement with this plan.   ____________________________________________  FINAL CLINICAL IMPRESSION(S) / ED DIAGNOSES  Final diagnoses:  Generalized abdominal pain  Leukocytosis  Abdominal wound dehiscence, initial encounter     MEDICATIONS GIVEN DURING THIS VISIT:  Medications  vancomycin (VANCOCIN)  IVPB 1000 mg/200 mL premix (not administered)  cefTRIAXone (ROCEPHIN) 1 g in dextrose 5 % 50 mL IVPB (0 g Intravenous Stopped 04/26/16 0055)  nystatin cream (MYCOSTATIN) (1 application Topical Given 04/25/16 2202)  morphine 4 MG/ML injection 4 mg (4 mg Intravenous Given 04/26/16 0013)  ondansetron (ZOFRAN) injection 4 mg (4 mg Intravenous Given 04/26/16 0013)     NEW OUTPATIENT MEDICATIONS STARTED DURING THIS VISIT:  New Prescriptions  No medications on file      Note:  This document was prepared using Dragon voice recognition software and may include unintentional dictation errors.    Loleta Rose, MD 04/26/16 [phone]

## 2016-04-25 NOTE — Telephone Encounter (Signed)
Patient will call back to schedule lab appointment

## 2016-04-25 NOTE — ED Triage Notes (Addendum)
Pt arrives to ER via POV c/o her c-section wound of "28 years" opening up and mild oozing. Painful. Pt had paracentesis done on 04/20/16 and states that the wound has opened and has foul odor since then. Pt alert and oriented X4, active, cooperative, pt in NAD. RR even and unlabored, color WNL. No fever or tachycardia present.  When looking at wound, wound has minimal clear discharge on covering that patient applied, no odor at this time, chaffing like redness around site, approx finger tip in diameter. Recovered with bandage.

## 2016-04-25 NOTE — Telephone Encounter (Signed)
Pt states she had 4 liters of fluid drawn off her abdomen last week. Reports she was in the bed for a couple of days after paracentesis and she noticed a "funky odor" but just thought she was sweating. Pt looked at her belly closer and now states that fluid is leaking out of a little hole at her scar from her c-section that she had years ago. Asked pt if she was sure it was not leaking from where the needle was used last week but she states she is sure. Discussed with pt that she should be evaluated at the ER to make sure everything was ok. Pt verbalized understanding.

## 2016-04-25 NOTE — Telephone Encounter (Signed)
Attempted to call pt back but received message that voicemail box is full. Will try again.

## 2016-04-26 ENCOUNTER — Encounter: Payer: Self-pay | Admitting: *Deleted

## 2016-04-26 ENCOUNTER — Observation Stay: Payer: 59

## 2016-04-26 DIAGNOSIS — B372 Candidiasis of skin and nail: Secondary | ICD-10-CM | POA: Diagnosis present

## 2016-04-26 DIAGNOSIS — D72829 Elevated white blood cell count, unspecified: Secondary | ICD-10-CM | POA: Diagnosis present

## 2016-04-26 LAB — PROTEIN, BODY FLUID: Total protein, fluid: <3 g/dL

## 2016-04-26 LAB — ALBUMIN, FLUID (OTHER)

## 2016-04-26 LAB — PATHOLOGIST SMEAR REVIEW

## 2016-04-26 LAB — COMPREHENSIVE METABOLIC PANEL
ALBUMIN: 2 g/dL — AB (ref 3.5–5.0)
ALT: 39 U/L (ref 14–54)
ANION GAP: 5 (ref 5–15)
AST: 119 U/L — AB (ref 15–41)
Alkaline Phosphatase: 100 U/L (ref 38–126)
BUN: 22 mg/dL — AB (ref 6–20)
CHLORIDE: 100 mmol/L — AB (ref 101–111)
CO2: 28 mmol/L (ref 22–32)
Calcium: 8 mg/dL — ABNORMAL LOW (ref 8.9–10.3)
Creatinine, Ser: 0.89 mg/dL (ref 0.44–1.00)
GFR calc Af Amer: >60 mL/min (ref >60)
Glucose, Bld: 90 mg/dL (ref 65–99)
POTASSIUM: 3.5 mmol/L (ref 3.5–5.1)
Sodium: 133 mmol/L — ABNORMAL LOW (ref 135–145)
Total Bilirubin: 4 mg/dL — ABNORMAL HIGH (ref 0.3–1.2)
Total Protein: 6.4 g/dL — ABNORMAL LOW (ref 6.5–8.1)

## 2016-04-26 LAB — PROTIME-INR
INR: 1.86
PROTHROMBIN TIME: 21.7 s — AB (ref 11.4–15.2)

## 2016-04-26 LAB — LACTATE DEHYDROGENASE, PLEURAL OR PERITONEAL FLUID: LD FL: 55 U/L — AB (ref 3–23)

## 2016-04-26 LAB — BODY FLUID CELL COUNT WITH DIFFERENTIAL
EOS FL: 0 %
LYMPHS FL: 9 %
MONOCYTE-MACROPHAGE-SEROUS FLUID: 60 %
NEUTROPHIL FLUID: 31 %
WBC FLUID: 1312 uL

## 2016-04-26 MED ORDER — CITALOPRAM HYDROBROMIDE 10 MG PO TABS
10.0000 mg | ORAL_TABLET | Freq: Every day | ORAL | Status: DC
  Administered 2016-04-26 – 2016-04-27 (×2): 10 mg via ORAL
  Filled 2016-04-26 (×2): qty 1

## 2016-04-26 MED ORDER — NYSTATIN 100000 UNIT/GM EX POWD
Freq: Three times a day (TID) | CUTANEOUS | Status: DC
  Administered 2016-04-26 – 2016-04-27 (×4): via TOPICAL
  Filled 2016-04-26: qty 15

## 2016-04-26 MED ORDER — OXYCODONE HCL 5 MG PO TABS
5.0000 mg | ORAL_TABLET | ORAL | Status: DC | PRN
Start: 2016-04-26 — End: 2016-04-27
  Administered 2016-04-26 – 2016-04-27 (×5): 5 mg via ORAL
  Filled 2016-04-26 (×5): qty 1

## 2016-04-26 MED ORDER — FUROSEMIDE 40 MG PO TABS
40.0000 mg | ORAL_TABLET | ORAL | Status: DC
  Administered 2016-04-26 – 2016-04-27 (×2): 40 mg via ORAL
  Filled 2016-04-26 (×3): qty 1

## 2016-04-26 MED ORDER — SIMETHICONE 80 MG PO CHEW
80.0000 mg | CHEWABLE_TABLET | Freq: Four times a day (QID) | ORAL | Status: DC | PRN
  Administered 2016-04-26: 16:00:00 80 mg via ORAL
  Filled 2016-04-26: qty 1

## 2016-04-26 MED ORDER — DEXTROSE 5 % IV SOLN
2.0000 g | INTRAVENOUS | Status: DC
  Administered 2016-04-26: 2 g via INTRAVENOUS
  Filled 2016-04-26 (×2): qty 2

## 2016-04-26 MED ORDER — ONDANSETRON HCL 4 MG/2ML IJ SOLN: 4.0000 mg | Freq: Four times a day (QID) | INTRAMUSCULAR | Status: DC | PRN

## 2016-04-26 MED ORDER — VANCOMYCIN HCL IN DEXTROSE 1-5 GM/200ML-% IV SOLN
1000.0000 mg | Freq: Three times a day (TID) | INTRAVENOUS | Status: DC
  Administered 2016-04-26: 1000 mg via INTRAVENOUS
  Filled 2016-04-26 (×3): qty 200

## 2016-04-26 MED ORDER — ENOXAPARIN SODIUM 40 MG/0.4ML ~~LOC~~ SOLN
40.0000 mg | Freq: Two times a day (BID) | SUBCUTANEOUS | Status: DC
  Administered 2016-04-26 – 2016-04-27 (×3): 40 mg via SUBCUTANEOUS
  Filled 2016-04-26 (×3): qty 0.4

## 2016-04-26 MED ORDER — PANTOPRAZOLE SODIUM 40 MG PO TBEC
40.0000 mg | DELAYED_RELEASE_TABLET | Freq: Every day | ORAL | Status: DC
  Administered 2016-04-26 – 2016-04-27 (×2): 40 mg via ORAL
  Filled 2016-04-26 (×2): qty 1

## 2016-04-26 MED ORDER — VANCOMYCIN HCL IN DEXTROSE 1-5 GM/200ML-% IV SOLN
1000.0000 mg | Freq: Once | INTRAVENOUS | Status: AC
  Administered 2016-04-26: 1000 mg via INTRAVENOUS
  Filled 2016-04-26: qty 200

## 2016-04-26 MED ORDER — MORPHINE SULFATE (PF) 2 MG/ML IV SOLN: 2.0000 mg | INTRAVENOUS | Status: DC | PRN

## 2016-04-26 MED ORDER — SPIRONOLACTONE 25 MG PO TABS
100.0000 mg | ORAL_TABLET | ORAL | Status: DC
  Administered 2016-04-26 – 2016-04-27 (×2): 100 mg via ORAL
  Filled 2016-04-26 (×3): qty 4

## 2016-04-26 MED ORDER — ONDANSETRON HCL 4 MG PO TABS: 4.0000 mg | ORAL_TABLET | Freq: Four times a day (QID) | ORAL | Status: DC | PRN

## 2016-04-26 NOTE — H&P (Signed)
[facility]   PATIENT NAME: Julie Graham    MR#:  [id number]  DATE OF BIRTH:  02-24-1966  DATE OF ADMISSION:  04/25/2016  PRIMARY CARE PHYSICIAN: [name], MD   REQUESTING/REFERRING PHYSICIAN: York Cerise, MD  CHIEF COMPLAINT:   Chief Complaint  Patient presents with  . Wound Check    HISTORY OF PRESENT ILLNESS:  Julie Graham  is a 50 y.o. female who presents with Abdominal pain. Patient has a recent diagnosis of alcoholic cirrhosis with significant ascites. Couple of days ago she had a large volume paracentesis. She comes back in today due to significant increased abdominal pain. Patient was diagnosed with ascites when she had leakage from an old cesarean section scar that seems to have reopened. She also states that after the paracentesis she had some leakage from the needle site as well. Today in the ED she had a mild leukocytosis raising some concern for possible SBP. Hospitalists were called for admission for diagnostic paracentesis and further evaluation. Of note, patient also seems to have what looks like a candidal infection around the area of her old C-section scar.  PAST MEDICAL HISTORY:   Past Medical History:  Diagnosis Date  . Alcoholism (HCC)   . Anal fissure    ?  Marland Kitchen Anxiety   . Blood in stool   . Cirrhosis (HCC)   . Depression   . Heart murmur   . Hypertension   . UTI (lower urinary tract infection)     PAST SURGICAL HISTORY:   Past Surgical History:  Procedure Laterality Date  . CESAREAN SECTION  1989  . CHOLECYSTECTOMY    . GASTRIC BYPASS  2009    SOCIAL HISTORY:   Social History  Substance Use Topics  . Smoking status: Never Smoker  . Smokeless tobacco: Never Used  . Alcohol use No     Comment: stopped drinking 02/16/16    FAMILY HISTORY:   Family History  Problem Relation Age of Onset  . Alcoholism Maternal Grandfather   . Stroke Maternal Grandfather   . Arthritis Mother   .  Hyperlipidemia Mother   . Hypertension Mother   . Stroke Mother   . Lymphoma Mother   . Heart failure Mother   . COPD Mother   . Diabetes Mother   . Breast cancer Cousin   . Heart disease Father     CHF  . Diabetes Father   . Arthritis Father   . Factor V Leiden deficiency Father   . Hyperlipidemia Father   . Hypertension Father   . COPD Father   . Sudden death Paternal Uncle   . Lung cancer Maternal Grandmother     DRUG ALLERGIES:   Allergies  Allergen Reactions  . Sulfa Antibiotics Hives    MEDICATIONS AT HOME:   Prior to Admission medications   Medication Sig Start Date End Date Taking? Authorizing Provider  acetaminophen (TYLENOL) 500 MG tablet Take 500-1,000 mg by mouth as needed.    Historical Provider, MD  citalopram (CELEXA) 10 MG tablet Take 1 tablet (10 mg total) by mouth daily. 04/21/16   Glori Luis, MD  furosemide (LASIX) 40 MG tablet Take 1 tablet (40 mg total) by mouth every morning. 02/24/16   Beverley Fiedler, MD  omeprazole (PRILOSEC) 40 MG capsule Take 1 capsule (40 mg total) by mouth 2 (two) times daily. 03/24/16 03/24/16  Beverley Fiedler, MD  promethazine (PHENERGAN) 12.5 MG tablet Take 1-2 tablets by mouth  every 6-8 hours as needed for nausea 03/30/16   Beverley Fiedler, MD  spironolactone (ALDACTONE) 100 MG tablet Take 1 tablet (100 mg total) by mouth every morning. 02/24/16   Beverley Fiedler, MD  traMADol (ULTRAM) 50 MG tablet Take 1-2 tablets (50-100 mg total) by mouth every 8 (eight) hours as needed for moderate pain. 04/21/16   Beverley Fiedler, MD    REVIEW OF SYSTEMS:  Review of Systems  Constitutional: Negative for chills, fever, malaise/fatigue and weight loss.  HENT: Negative for ear pain, hearing loss and tinnitus.   Eyes: Negative for blurred vision, double vision, pain and redness.  Respiratory: Negative for cough, hemoptysis and shortness of breath.   Cardiovascular: Negative for chest pain, palpitations, orthopnea and leg swelling.  Gastrointestinal:  Positive for abdominal pain. Negative for constipation, diarrhea, nausea and vomiting.  Genitourinary: Negative for dysuria, frequency and hematuria.  Musculoskeletal: Negative for back pain, joint pain and neck pain.  Skin:       No acne, rash, or lesions  Neurological: Negative for dizziness, tremors, focal weakness and weakness.  Endo/Heme/Allergies: Negative for polydipsia. Does not bruise/bleed easily.  Psychiatric/Behavioral: Negative for depression. The patient is not nervous/anxious and does not have insomnia.      VITAL SIGNS:   Vitals:   04/25/16 1810 04/25/16 2350  BP: (!) 148/78 133/78  Pulse: 90 94  Resp: 16 18  Temp: 97.7 F (36.5 C)   TempSrc: Oral   SpO2: 100% 99%  Weight: 112.9 kg (249 lb)    Wt Readings from Last 3 Encounters:  04/25/16 112.9 kg (249 lb)  04/06/16 112.9 kg (249 lb)  03/24/16 117.9 kg (260 lb)    PHYSICAL EXAMINATION:  Physical Exam  Vitals reviewed. Constitutional: She is oriented to person, place, and time. She appears well-developed and well-nourished. No distress.  HENT:  Head: Normocephalic and atraumatic.  Mouth/Throat: Oropharynx is clear and moist.  Eyes: Conjunctivae and EOM are normal. Pupils are equal, round, and reactive to light. No scleral icterus.  Neck: Normal range of motion. Neck supple. No JVD present. No thyromegaly present.  Cardiovascular: Normal rate, regular rhythm and intact distal pulses.  Exam reveals no gallop and no friction rub.   No murmur heard. Respiratory: Effort normal and breath sounds normal. No respiratory distress. She has no wheezes. She has no rales.  GI: Soft. Bowel sounds are normal. She exhibits no distension. There is tenderness.  Some bruising over her left abdomen the area where she had the paracentesis done, low slightly off midline scar with erythema and some satellite lesions underneath the pannus as well as small opening which looks like it could potentially be a fistula opening at the base  of her scar underneath the pannus. No drainage noted.  Musculoskeletal: Normal range of motion. She exhibits no edema.  No arthritis, no gout  Lymphadenopathy:    She has no cervical adenopathy.  Neurological: She is alert and oriented to person, place, and time. No cranial nerve deficit.  No dysarthria, no aphasia  Skin: Skin is warm and dry. No rash noted. No erythema.  Psychiatric: She has a normal mood and affect. Her behavior is normal. Thought content normal.    LABORATORY PANEL:   CBC  Recent Labs Lab 04/25/16 2131  WBC 12.1*  HGB 10.4*  HCT 29.5*  PLT 138*   ------------------------------------------------------------------------------------------------------------------  Chemistries   Recent Labs Lab 04/25/16 2131  NA 131*  K 3.5  CL 97*  CO2 27  GLUCOSE  96  BUN 26*  CREATININE 0.85  CALCIUM 8.0*  MG 2.0  AST 126*  ALT 42  ALKPHOS 105  BILITOT 4.6*   ------------------------------------------------------------------------------------------------------------------  Cardiac Enzymes No results for input(s): TROPONINI in the last 168 hours. ------------------------------------------------------------------------------------------------------------------  RADIOLOGY:  No results found.  EKG:  No orders found for this or any previous visit.  IMPRESSION AND PLAN:  Principal Problem:   Abdominal pain - unclear etiology, potentially related to her recent procedure, though cannot rule out definitively something like SBP at this time. She does have a mild leukocytosis, see below. She denies any other infectious symptoms other than her significant increase in abdominal pain. ED staff was unable to obtain a good window for diagnostic paracentesis in the ED tonight. We've ordered this for tomorrow morning via radiology. Empiric antibiotics were started in the ED and continued on admission. Patient refused surgical consult at this time to evaluate her possible  fistula formation around her surgical scar. Active Problems:   Alcoholic cirrhosis (HCC) - avoid hepatotoxins, LFTs currently stable   Anxiety and depression - continue home meds   Leukocytosis - differential is normal, unclear if this is due to possible infection, workup as above   Candidal skin infection - nystatin powder  All the records are reviewed and case discussed with ED provider. Management plans discussed with the patient and/or family.  DVT PROPHYLAXIS: SubQ lovenox  GI PROPHYLAXIS: PPI  ADMISSION STATUS: Observation  CODE STATUS: Full Code Status History    This patient does not have a recorded code status. Please follow your organizational policy for patients in this situation.      TOTAL TIME TAKING CARE OF THIS PATIENT: 40 minutes.    Takeshia Wenk FIELDING 04/26/2016, 12:20 AM  [facility]  Office  [phone]  CC: Primary care physician; [name], MD

## 2016-04-26 NOTE — Progress Notes (Signed)
[facility] [place] [facility]   PATIENT NAME: Julie Graham    MR#:  [id number]  DATE OF BIRTH:  [birth year]  SUBJECTIVE:   Patient here with a history of alcoholic liver cirrhosis and having some abdominal pain and noted to have peritonitis. Still having some abdominal pain. Status post ultrasound-guided paracentesis with 2 L of fluid removed. Fluid analysis is consistent with SBP.  REVIEW OF SYSTEMS:    Review of Systems  Constitutional: Negative for chills and fever.  HENT: Negative for congestion and tinnitus.   Eyes: Negative for blurred vision and double vision.  Respiratory: Negative for cough, shortness of breath and wheezing.   Cardiovascular: Negative for chest pain, orthopnea and PND.  Gastrointestinal: Positive for abdominal pain. Negative for diarrhea, nausea and vomiting.  Genitourinary: Negative for dysuria and hematuria.  Neurological: Negative for dizziness, sensory change and focal weakness.  All other systems reviewed and are negative.   Nutrition: Heart healthy Tolerating Diet: Yes Tolerating PT: Ambulatory   DRUG ALLERGIES:   Allergies  Allergen Reactions  . Sulfa Antibiotics Hives    VITALS:  Blood pressure (!) 146/69, pulse 100, temperature 98.4 F (36.9 C), temperature source Oral, resp. rate 18, weight 112.9 kg (249 lb), SpO2 100 %.  PHYSICAL EXAMINATION:   Physical Exam  GENERAL:  50 y.o.-year-old patient lying in the bed with in no acute distress.  EYES: Pupils equal, round, reactive to light and accommodation. No scleral icterus. Extraocular muscles intact.  HEENT: Head atraumatic, normocephalic. Oropharynx and nasopharynx clear.  NECK:  Supple, no jugular venous distention. No thyroid enlargement, no tenderness.  LUNGS: Normal breath sounds bilaterally, no wheezing, rales, rhonchi. No use of accessory muscles of respiration.  CARDIOVASCULAR: S1, S2 normal. No murmurs, rubs, or gallops.  ABDOMEN: Soft, tender diffusely,  no rebound, rigidity. No organomegaly appreciated. Scar on the lower abdomen from a previous hysterectomy with a small open area with minimal drainage. Bilateral inguinal/below the pannus fungal infection. EXTREMITIES: No cyanosis, clubbing or edema b/l.    NEUROLOGIC: Cranial nerves II through XII are intact. No focal Motor or sensory deficits b/l.   PSYCHIATRIC: The patient is alert and oriented x 3.  SKIN: No obvious rash, lesion, or ulcer.    LABORATORY PANEL:   CBC  Recent Labs Lab 04/25/16 2131  WBC 12.1*  HGB 10.4*  HCT 29.5*  PLT 138*   ------------------------------------------------------------------------------------------------------------------  Chemistries   Recent Labs Lab 04/25/16 2131 04/26/16 0506  NA 131* 133*  K 3.5 3.5  CL 97* 100*  CO2 27 28  GLUCOSE 96 90  BUN 26* 22*  CREATININE 0.85 0.89  CALCIUM 8.0* 8.0*  MG 2.0  --   AST 126* 119*  ALT 42 39  ALKPHOS 105 100  BILITOT 4.6* 4.0*   ------------------------------------------------------------------------------------------------------------------  Cardiac Enzymes No results for input(s): TROPONINI in the last 168 hours. ------------------------------------------------------------------------------------------------------------------  RADIOLOGY:  US Paracentesis  Result Date: 04/26/2016 INDICATION: 50 year old female with ascites and recurrent ascites. EXAM: ULTRASOUND GUIDED  PARACENTESIS MEDICATIONS: None. COMPLICATIONS: None immediate. PROCEDURE: Informed written consent was obtained from the patient after a discussion of the risks, benefits and alternatives to treatment. A timeout was performed prior to the initiation of the procedure. Initial ultrasound scanning demonstrates a large amount of ascites within the right lower abdominal quadrant. The right lower abdomen was prepped and draped in the usual sterile fashion. 1% lidocaine with epinephrine was used for local anesthesia. Following  this, a 6 Fr Safe-T-Centesis catheter was introduced. An  ultrasound image was saved for documentation purposes. The paracentesis was performed. The catheter was removed and a dressing was applied. The patient tolerated the procedure well without immediate post procedural complication. FINDINGS: A total of approximately 2000 mL of amber colored ascitic fluid was removed. Samples were sent to the laboratory as requested by the clinical team. IMPRESSION: Successful ultrasound-guided paracentesis yielding 2 liters of peritoneal fluid. Electronically Signed   By: Malachy Moan M.D.   On: 04/26/2016 14:41     ASSESSMENT AND PLAN:   50 year old female with past medical history of alcohol abuse, alcoholic liver cirrhosis, depression, who presented to the hospital due to abdominal pain.  1. Abdominal pain-suspected to be secondary to spontaneous bacterial peritonitis. -Continue IV ceftriaxone, supportive care with pain control.  2. SBP-patient is status post diagnostic/therapeutic paracentesis today. Her fluid analysis is consistent with SBP. -Continue IV ceftriaxone, follow cultures. Clinically afebrile, follow white cell count.  3. Inguinal/abdominal pannus fungal infection-continue nystatin powder.  4. GERD-continue Protonix.  5. Alcoholic liver cirrhosis-no evidence of hepatic encephalopathy. -Continue Aldactone, Lasix.  6. Depression-continue Celexa.     All the records are reviewed and case discussed with Care Management/Social Workerr. Management plans discussed with the patient, family and they are in agreement.  CODE STATUS: Full  DVT Prophylaxis: Lovenox  TOTAL TIME TAKING CARE OF THIS PATIENT: 25 minutes.   POSSIBLE D/C IN 2-3 DAYS, DEPENDING ON CLINICAL CONDITION.   Houston Siren M.D on 04/26/2016 at 3:17 PM  Between 7am to 6pm - Pager - [phone]  After 6pm go to [email or web address] - password [email or web address] [facility]  [facility]  Office   [phone]  CC: Primary care physician; [name], MD

## 2016-04-26 NOTE — ED Notes (Signed)
hospitalist in to admit, pt given meal tray. States no pain at this time.

## 2016-04-26 NOTE — Progress Notes (Signed)
Pharmacy Antibiotic Note  Julie Graham is a 50 y.o. female admitted on 04/25/2016 with IAI possible SBP.  Pharmacy has been consulted for ceftriaxone and vancomycin dosing.  Plan: 1. Ceftriaxone 2 gm IV Q24H 2. Vancomycin 1 gm IV x 1 followed in 6 hours (stacked dosing) by vancomycin 1 gm IV Q8H, predicted trough 18 mcg/mL. Pharmacy will continue to follow and adjust as needed to maintain trough 15 to 20 mcg/mL.   Vd 55.6 L, Ke 0.089 hr-1, T1/2 7.8 hr  Weight: 249 lb (112.9 kg)  Temp (24hrs), Avg:98.1 F (36.7 C), Min:97.7 F (36.5 C), Max:98.4 F (36.9 C)   Recent Labs Lab 04/25/16 2131  WBC 12.1*  CREATININE 0.85    Estimated Creatinine Clearance: 101.8 mL/min (by C-G formula based on SCr of 0.85 mg/dL).    Allergies  Allergen Reactions  . Sulfa Antibiotics Hives    Thank you for allowing pharmacy to be a part of this patient's care.  Carola Frost, Pharm.D., BCPS Clinical Pharmacist 04/26/2016 3:16 AM

## 2016-04-26 NOTE — Progress Notes (Signed)
Alert and oriented, ambulatory in room, abdominal pain following paracentesis improved with prn medication, on room air, vital signs stable, good appetite, dressing over panus wound changed and nystatin powder applied. Patient will continue on IV antibiotics, uneventful shift.

## 2016-04-26 NOTE — ED Notes (Signed)
Report called to [facility] on 1C pt admitted.

## 2016-04-27 LAB — CBC
HEMATOCRIT: 29.8 % — AB (ref 35.0–47.0)
Hemoglobin: 10.6 g/dL — ABNORMAL LOW (ref 12.0–16.0)
MCH: 33.9 pg (ref 26.0–34.0)
MCHC: 35.6 g/dL (ref 32.0–36.0)
MCV: 95.3 fL (ref 80.0–100.0)
PLATELETS: 157 10*3/uL (ref 150–440)
RBC: 3.13 MIL/uL — AB (ref 3.80–5.20)
RDW: 13.9 % (ref 11.5–14.5)
WBC: 9.9 10*3/uL (ref 3.6–11.0)

## 2016-04-27 LAB — MISC LABCORP TEST (SEND OUT): Labcorp test code: 19588

## 2016-04-27 MED ORDER — NYSTATIN 100000 UNIT/GM EX POWD: Freq: Three times a day (TID) | CUTANEOUS | 0 refills | Status: DC

## 2016-04-27 MED ORDER — CIPROFLOXACIN HCL 500 MG PO TABS: 500.0000 mg | ORAL_TABLET | Freq: Two times a day (BID) | ORAL | 0 refills | Status: DC

## 2016-04-27 NOTE — Care Management (Signed)
Admitted to [facility] with the diagnosis of abdominal pain under observation status. Niece and her 2 boys (13 years and and 68year old). Friend is Teaching laboratory technician [phone]). Last seen Dr. Ellamae Sia 6 weeks ago. Takes care of all basic and instrumental activities of daily living herself, drives. Works at [employer].  Friends will transport. Gwenette Greet RN MSN CCM Care Management [phone]

## 2016-04-27 NOTE — Discharge Summary (Signed)
[facility] [place] at [facility]   PATIENT NAME: Julie Graham    MR#:  [id number]  DATE OF BIRTH:  1966-08-04  DATE OF ADMISSION:  04/25/2016 ADMITTING PHYSICIAN: Oralia Manis, MD  DATE OF DISCHARGE: 04/27/2016  PRIMARY CARE PHYSICIAN: [name], MD    ADMISSION DIAGNOSIS:  Leukocytosis [D72.829] Generalized abdominal pain [R10.84] Abdominal wound dehiscence, initial encounter [T81.30XA]  DISCHARGE DIAGNOSIS:  Principal Problem:   Abdominal pain Active Problems:   Alcoholic cirrhosis (HCC)   Anxiety and depression   Leukocytosis   Candidal skin infection   SECONDARY DIAGNOSIS:   Past Medical History:  Diagnosis Date  . Alcoholism (HCC)   . Anal fissure    ?  Marland Kitchen Anxiety   . Blood in stool   . Cirrhosis (HCC)   . Depression   . Heart murmur   . Hypertension   . UTI (lower urinary tract infection)     HOSPITAL COURSE:   50 year old female with past medical history of alcohol abuse, alcoholic liver cirrhosis, depression, who presented to the hospital due to abdominal pain.  1. Abdominal pain-This was secondary to peritonitis. Patient had SBP given her history of liver cirrhosis with ascites. -Patient was treated supportively with IV fluids, pain control, IV ceftriaxone. She has clinically improved with less abdominal pain today. She is afebrile, hemodynamically stable.  2. SBP-patient's ascitic fluid analysis was consistent with peritonitis. She was treated with IV ceftriaxone while in the hospital. Her cultures from the peritoneal fluid are currently negative. -She has clinically improved she is afebrile with a normal white cell count and hemodynamically stable. The pain is better controlled. She is being discharged on oral ciprofloxacin.  3. Inguinal/abdominal pannus fungal infection- she is being discharged on Nystatin powder.    4. GERD- she will resume her Omeprazole   5. Alcoholic liver cirrhosis-no evidence of hepatic  encephalopathy. - She will Continue Aldactone, Lasix.  6. Depression- she will continue Celexa.   DISCHARGE CONDITIONS:   Stable  CONSULTS OBTAINED:    DRUG ALLERGIES:   Allergies  Allergen Reactions  . Sulfa Antibiotics Hives    DISCHARGE MEDICATIONS:     Medication List    TAKE these medications   acetaminophen 500 MG tablet Commonly known as:  TYLENOL Take 500-1,000 mg by mouth as needed.   ciprofloxacin 500 MG tablet Commonly known as:  CIPRO Take 1 tablet (500 mg total) by mouth 2 (two) times daily.   citalopram 10 MG tablet Commonly known as:  CELEXA Take 1 tablet (10 mg total) by mouth daily.   furosemide 40 MG tablet Commonly known as:  LASIX Take 1 tablet (40 mg total) by mouth every morning.   nystatin powder Commonly known as:  MYCOSTATIN/NYSTOP Apply topically 3 (three) times daily. To the Affected area in the b/l Groin.   omeprazole 40 MG capsule Commonly known as:  PRILOSEC Take 40 mg by mouth 2 (two) times daily. What changed:  Another medication with the same name was removed. Continue taking this medication, and follow the directions you see here.   promethazine 12.5 MG tablet Commonly known as:  PHENERGAN Take 1-2 tablets by mouth every 6-8 hours as needed for nausea   traMADol 50 MG tablet Commonly known as:  ULTRAM Take 1-2 tablets (50-100 mg total) by mouth every 8 (eight) hours as needed for moderate pain.         DISCHARGE INSTRUCTIONS:   DIET:  Cardiac diet  DISCHARGE CONDITION:  Stable  ACTIVITY:  Activity as tolerated  OXYGEN:  Home Oxygen: No.   Oxygen Delivery: room air  DISCHARGE LOCATION:  home   If you experience worsening of your admission symptoms, develop shortness of breath, life threatening emergency, suicidal or homicidal thoughts you must seek medical attention immediately by calling 911 or calling your MD immediately  if symptoms less severe.  You Must read complete instructions/literature  along with all the possible adverse reactions/side effects for all the Medicines you take and that have been prescribed to you. Take any new Medicines after you have completely understood and accpet all the possible adverse reactions/side effects.   Please note  You were cared for by a hospitalist during your hospital stay. If you have any questions about your discharge medications or the care you received while you were in the hospital after you are discharged, you can call the unit and asked to speak with the hospitalist on call if the hospitalist that took care of you is not available. Once you are discharged, your primary care physician will handle any further medical issues. Please note that NO REFILLS for any discharge medications will be authorized once you are discharged, as it is imperative that you return to your primary care physician (or establish a relationship with a primary care physician if you do not have one) for your aftercare needs so that they can reassess your need for medications and monitor your lab values.     Today   Still having some abdominal pain, but no N/V. WBC count improved. NO fever.    VITAL SIGNS:  Blood pressure 129/68, pulse 97, temperature 98.4 F (36.9 C), temperature source Oral, resp. rate (!) 22, height 5' 5.25" (1.657 m), weight 112.9 kg (249 lb), SpO2 100 %.  I/O:   Intake/Output Summary (Last 24 hours) at 04/27/16 1428 Last data filed at 04/27/16 1315  Gross per 24 hour  Intake              290 ml  Output             1200 ml  Net             -910 ml    PHYSICAL EXAMINATION:   GENERAL:  50 y.o.-year-old patient lying in the bed with in no acute distress.  EYES: Pupils equal, round, reactive to light and accommodation. No scleral icterus. Extraocular muscles intact.  HEENT: Head atraumatic, normocephalic. Oropharynx and nasopharynx clear.  NECK:  Supple, no jugular venous distention. No thyroid enlargement, no tenderness.  LUNGS: Normal  breath sounds bilaterally, no wheezing, rales, rhonchi. No use of accessory muscles of respiration.  CARDIOVASCULAR: S1, S2 normal. No murmurs, rubs, or gallops.  ABDOMEN: Soft, non-tender, no rebound, rigidity. No organomegaly appreciated. Scar on the lower abdomen from a previous hysterectomy with a small open area with minimal drainage. Bilateral inguinal/below the abdominal pannus fungal infection improving.  EXTREMITIES: No cyanosis, clubbing or edema b/l.    NEUROLOGIC: Cranial nerves II through XII are intact. No focal Motor or sensory deficits b/l.   PSYCHIATRIC: The patient is alert and oriented x 3.  SKIN: No obvious rash, lesion, or ulcer.   DATA REVIEW:   CBC  Recent Labs Lab 04/27/16 0504  WBC 9.9  HGB 10.6*  HCT 29.8*  PLT 157    Chemistries   Recent Labs Lab 04/25/16 2131 04/26/16 0506  NA 131* 133*  K 3.5 3.5  CL 97* 100*  CO2 27 28  GLUCOSE 96 90  BUN  26* 22*  CREATININE 0.85 0.89  CALCIUM 8.0* 8.0*  MG 2.0  --   AST 126* 119*  ALT 42 39  ALKPHOS 105 100  BILITOT 4.6* 4.0*    Cardiac Enzymes No results for input(s): TROPONINI in the last 168 hours.  Microbiology Results  Results for orders placed or performed during the hospital encounter of 04/25/16  Body fluid culture     Status: None (Preliminary result)   Collection Time: 04/26/16 12:10 PM  Result Value Ref Range Status   Specimen Description PERITONEAL  Final   Special Requests NONE  Final   Gram Stain   Final    MODERATE WBC PRESENT,BOTH PMN AND MONONUCLEAR NO ORGANISMS SEEN    Culture   Final    NO GROWTH < 24 HOURS Performed at [facility]    Report Status PENDING  Incomplete    RADIOLOGY:  US Paracentesis  Result Date: 04/26/2016 INDICATION: 50 year old female with ascites and recurrent ascites. EXAM: ULTRASOUND GUIDED  PARACENTESIS MEDICATIONS: None. COMPLICATIONS: None immediate. PROCEDURE: Informed written consent was obtained from the patient after a discussion of  the risks, benefits and alternatives to treatment. A timeout was performed prior to the initiation of the procedure. Initial ultrasound scanning demonstrates a large amount of ascites within the right lower abdominal quadrant. The right lower abdomen was prepped and draped in the usual sterile fashion. 1% lidocaine with epinephrine was used for local anesthesia. Following this, a 6 Fr Safe-T-Centesis catheter was introduced. An ultrasound image was saved for documentation purposes. The paracentesis was performed. The catheter was removed and a dressing was applied. The patient tolerated the procedure well without immediate post procedural complication. FINDINGS: A total of approximately 2000 mL of amber colored ascitic fluid was removed. Samples were sent to the laboratory as requested by the clinical team. IMPRESSION: Successful ultrasound-guided paracentesis yielding 2 liters of peritoneal fluid. Electronically Signed   By: Malachy Moan M.D.   On: 04/26/2016 14:41      Management plans discussed with the patient, family and they are in agreement.  CODE STATUS:     Code Status Orders        Start     Ordered   04/26/16 0248  Full code  Continuous     04/26/16 0248    Code Status History    Date Active Date Inactive Code Status Order ID Comments User Context   This patient has a current code status but no historical code status.      TOTAL TIME TAKING CARE OF THIS PATIENT: 40 minutes.    Houston Siren M.D on 04/27/2016 at 2:28 PM  Between 7am to 6pm - Pager - [phone]  After 6pm go to [email or web address] - password [email or web address] [facility]  [facility]  Office  [phone]  CC: Primary care physician; [name], MD

## 2016-04-27 NOTE — Progress Notes (Signed)
Patient is alert and oriented x 4, ambulatory in room, c/o abdominal pain improved with Roxicodone, good appetite, discharged to home, pt to continue on cipro bid for 5 days, RX for cipro and nystatin given to patient, minimum drainage from abdominal wound, patient instructed on applying nystatin powder and as need dressings to wound. Patient is to f/u with pcp within one week, pt reports understanding of d/c instructions and has no further questions at this time, awaiting on ride for discharge. Uneventful shift.

## 2016-04-29 ENCOUNTER — Telehealth: Payer: Self-pay

## 2016-04-29 NOTE — Telephone Encounter (Signed)
Unable to reach patient.  Attempt to follow up with transitional care management.  Left a message to return a call to the office back.  Appointment scheduled with PCP.

## 2016-04-30 ENCOUNTER — Other Ambulatory Visit: Payer: Self-pay | Admitting: Internal Medicine

## 2016-04-30 LAB — BODY FLUID CULTURE: Culture: NO GROWTH

## 2016-05-02 ENCOUNTER — Encounter: Payer: Self-pay | Admitting: Internal Medicine

## 2016-05-02 ENCOUNTER — Telehealth: Payer: Self-pay

## 2016-05-02 NOTE — Telephone Encounter (Signed)
Unable to reach patient. Will continue to follow.  Appointment scheduled with PCP 05/12/16.

## 2016-05-03 ENCOUNTER — Telehealth: Payer: Self-pay

## 2016-05-03 NOTE — Telephone Encounter (Signed)
Unable to reach patient.  Left a message to call the office back.  Hospital follow up appointment scheduled with PCP.

## 2016-05-04 ENCOUNTER — Telehealth: Payer: Self-pay | Admitting: *Deleted

## 2016-05-04 LAB — PH, BODY FLUID: PH, BODY FLUID: 7.9

## 2016-05-04 NOTE — Telephone Encounter (Signed)
Patient will need a HFU, can pt use Dr. Birdie SonsSonnenberg 11:30 on 05/05/16 She's having constipation-she discharged on 08/02 per pt.

## 2016-05-04 NOTE — Telephone Encounter (Signed)
Attempted to call patient x3 since discharge to follow up with transitional care management.  Left messages for her to call the office back.  She has a follow up scheduled with PCP on 05/12/16.  Will call the patient back and attempt to reschedule for 05/05/16.

## 2016-05-04 NOTE — Telephone Encounter (Signed)
Noted  

## 2016-05-04 NOTE — Telephone Encounter (Signed)
Called patient back to follow up with rescheduling her appointment and transitional care management.  No answer.  Left a message to call the office back.

## 2016-05-04 NOTE — Telephone Encounter (Signed)
Julie Graham, is this a TCM?

## 2016-05-05 NOTE — Telephone Encounter (Signed)
Noted, thanks for the follow-through.

## 2016-05-10 NOTE — Telephone Encounter (Signed)
Unable to reach patient.  Attempted to follow up with transitional care management.   Hospital follow up  scheduled with PCP 05/12/16.  Left a message to return my call.

## 2016-05-11 ENCOUNTER — Telehealth: Payer: Self-pay | Admitting: Internal Medicine

## 2016-05-11 ENCOUNTER — Telehealth: Payer: Self-pay | Admitting: Family Medicine

## 2016-05-11 NOTE — Telephone Encounter (Signed)
Returned call to patient.  Left a message regarding follow up with transitional care management prior to tomorrow's appointment with PCP.

## 2016-05-11 NOTE — Telephone Encounter (Signed)
I would recommend that an office follow-up with me be scheduled Please place on cancellation list so that she can be worked in She will need ciprofloxacin 500 mg daily on an ongoing basis for SBP prophylaxis given recent SBP Need to repeat CBC, CMP and INR. Repeat IgG Proceed with IR guided transjugular liver biopsy. Rule out autoimmune hepatitis in patient with positive ANA, positive anti-smooth muscle antibody, and elevated IgG. She needs to continue to strictly avoid alcohol

## 2016-05-11 NOTE — Telephone Encounter (Signed)
Pt would like to schedule liver biopsy. Does pt need US guided liver biopsy? Please advise.

## 2016-05-11 NOTE — Telephone Encounter (Signed)
Ms. Julie Graham returned [name]'s phone call. She said it's ok for [name] to leave a message if necessary due to her having done a lot today and she's ready to lay down. She's aware of her appointment for tomorrow.  Pt's ph# [phone] Thank you.

## 2016-05-12 ENCOUNTER — Other Ambulatory Visit: Payer: Self-pay

## 2016-05-12 ENCOUNTER — Encounter: Payer: Self-pay | Admitting: Family Medicine

## 2016-05-12 ENCOUNTER — Ambulatory Visit: Payer: 59 | Admitting: Family Medicine

## 2016-05-12 ENCOUNTER — Ambulatory Visit (INDEPENDENT_AMBULATORY_CARE_PROVIDER_SITE_OTHER): Payer: 59 | Admitting: Family Medicine

## 2016-05-12 ENCOUNTER — Other Ambulatory Visit: Payer: Self-pay | Admitting: *Deleted

## 2016-05-12 DIAGNOSIS — K652 Spontaneous bacterial peritonitis: Secondary | ICD-10-CM

## 2016-05-12 DIAGNOSIS — R600 Localized edema: Secondary | ICD-10-CM

## 2016-05-12 DIAGNOSIS — K7469 Other cirrhosis of liver: Secondary | ICD-10-CM

## 2016-05-12 DIAGNOSIS — R945 Abnormal results of liver function studies: Principal | ICD-10-CM

## 2016-05-12 DIAGNOSIS — K7031 Alcoholic cirrhosis of liver with ascites: Secondary | ICD-10-CM

## 2016-05-12 DIAGNOSIS — R7989 Other specified abnormal findings of blood chemistry: Secondary | ICD-10-CM

## 2016-05-12 HISTORY — DX: Spontaneous bacterial peritonitis: K65.2

## 2016-05-12 MED ORDER — OMEPRAZOLE 40 MG PO CPDR: 40.0000 mg | DELAYED_RELEASE_CAPSULE | Freq: Two times a day (BID) | ORAL | 0 refills | Status: DC

## 2016-05-12 MED ORDER — FUROSEMIDE 40 MG PO TABS: 40.0000 mg | ORAL_TABLET | ORAL | 0 refills | Status: DC

## 2016-05-12 MED ORDER — CIPROFLOXACIN HCL 500 MG PO TABS: 500.0000 mg | ORAL_TABLET | Freq: Two times a day (BID) | ORAL | 6 refills | Status: DC

## 2016-05-12 MED ORDER — SPIRONOLACTONE 100 MG PO TABS: 100.0000 mg | ORAL_TABLET | ORAL | 0 refills | Status: DC

## 2016-05-12 NOTE — Progress Notes (Signed)
  Julie AlarEric Herschell Virani, MD Phone: [phone]  Julie Graham is a 50 y.o. female who presents today for follow-up.  Patient presents for hospital follow-up today. She was hospitalized for spontaneous bacterial peritonitis following a paracentesis. She feels overall improved. He was discharged on Cipro and has finished this. Mild soreness in her left abdomen where they did the paracentesis though no other abdominal pain. No fevers. Overall feels weak and drained. Notes she has been in contact with her GI office and had lab work done yesterday. They have scheduled her to do a repeat EGD in September. Potentially going to do a biopsy. She is continuing to take her Lasix and spironolactone. Does still have some swelling in her legs and wonders why they are discolored.  PMH: Former alcohol abuser   ROS see history of present illness  Objective  Physical Exam Vitals:   05/12/16 0919  BP: 122/68  Pulse: 96  Temp: 98.6 F (37 C)    BP Readings from Last 3 Encounters:  05/12/16 122/68  04/27/16 129/68  04/20/16 (!) 151/92   Wt Readings from Last 3 Encounters:  05/12/16 232 lb 6.4 oz (105.4 kg)  04/25/16 249 lb (112.9 kg)  04/06/16 249 lb (112.9 kg)    Physical Exam  Constitutional: No distress.  HENT:  Head: Normocephalic and atraumatic.  Eyes: Pupils are equal, round, and reactive to light. Scleral icterus (mild) is present.  Cardiovascular: Normal rate, regular rhythm and normal heart sounds.   Trace pitting edema with hemosiderin deposition changes in her bilateral lower extremities  Pulmonary/Chest: Effort normal and breath sounds normal.  Abdominal: Soft. Bowel sounds are normal. She exhibits no distension. There is no tenderness. There is no rebound and no guarding.  Neurological: She is alert. Gait normal.  Skin: Skin is warm and dry. She is not diaphoretic.  Mild jaundice     Assessment/Plan: Please see individual problem list.  Alcoholic cirrhosis (HCC) Has continued to  abstain from alcohol. Does appear to have mild jaundice today. Does have some bruising as well in her skin. Discussed that she needs to contact her GI physician to get further instructions per their phone note from yesterday. Appears that she may be undergoing a biopsy. She does report she had lab work done yesterday through GI at [facility] [employer] though this does not appear to be in the system. We will contact [facility] to get these results.  SBP (spontaneous bacterial peritonitis) [facility](HCC) Patient hospitalized for SBP recently. Appears to be doing well. Has finished ciprofloxacin. Discussed having her call her GI physician to find out further details regarding her phone note from yesterday. Labs completed yesterday have been obtained and reveal a normal white blood cell count, mildly elevated AST at 66 and normal range ALT. Bilirubin 3.9 is stable from previously. Potassium minimally low at 3.4. Otherwise relatively unremarkable. Discussed that she needs to call her GI physician to determine the next step in management. She was advised if she develops abdominal pain, fevers, chills, or any new or change in symptoms she needs to be evaluated immediately..  Bilateral lower extremity edema Edema is significantly improved since she has been treated with Lasix and spironolactone. Does have hemosiderin changes in her bilateral lower extremities. Discussed continuing Lasix and spironolactone and monitoring swelling.  Julie AlarEric Khamya Topp, MD [facility]

## 2016-05-12 NOTE — Telephone Encounter (Signed)
Lab results received from [facility] and placed on Dr. Lauro FranklinPyrtle's desk for review. Please advise when you would like labs drawn again.

## 2016-05-12 NOTE — Assessment & Plan Note (Signed)
Patient hospitalized for SBP recently. Appears to be doing well. Has finished ciprofloxacin. Discussed having her call her GI physician to find out further details regarding her phone note from yesterday. Labs completed yesterday have been obtained and reveal a normal white blood cell count, mildly elevated AST at 66 and normal range ALT. Bilirubin 3.9 is stable from previously. Potassium minimally low at 3.4. Otherwise relatively unremarkable. Discussed that she needs to call her GI physician to determine the next step in management. She was advised if she develops abdominal pain, fevers, chills, or any new or change in symptoms she needs to be evaluated immediately..Marland Kitchen

## 2016-05-12 NOTE — Patient Instructions (Signed)
Nice to see you. Please contact your GI physician to follow-up on your phone call from yesterday. If you develop worsening abdominal discomfort, fevers, or any new or changing symptoms please seek medical attention.

## 2016-05-12 NOTE — Telephone Encounter (Signed)
Order placed for biopsy, Tiffany in IR to contact the pt to schedule. Script for cipro in epic. Lab orders in epic. Pt scheduled for OV with Dr. Rhea BeltonPyrtle 10/27@11am . Pt placed on cancellation list per Dr. Lauro FranklinPyrtle's request.

## 2016-05-12 NOTE — Assessment & Plan Note (Signed)
Has continued to abstain from alcohol. Does appear to have mild jaundice today. Does have some bruising as well in her skin. Discussed that she needs to contact her GI physician to get further instructions per their phone note from yesterday. Appears that she may be undergoing a biopsy. She does report she had lab work done yesterday through GI at [facility] [employer] though this does not appear to be in the system. We will contact [facility] to get these results.

## 2016-05-12 NOTE — Progress Notes (Signed)
Pre visit review using our clinic review tool, if applicable. No additional management support is needed unless otherwise documented below in the visit note. 

## 2016-05-12 NOTE — Assessment & Plan Note (Signed)
Edema is significantly improved since she has been treated with Lasix and spironolactone. Does have hemosiderin changes in her bilateral lower extremities. Discussed continuing Lasix and spironolactone and monitoring swelling.

## 2016-05-13 ENCOUNTER — Telehealth: Payer: Self-pay

## 2016-05-13 NOTE — Telephone Encounter (Signed)
Per Dr. Rhea BeltonPyrtle he would like her to have PT/INR and IgG drawn now. She should have repeat cbc, cmp, inr in 3 weeks. Left message for pt to call back.

## 2016-05-16 ENCOUNTER — Telehealth: Payer: Self-pay | Admitting: *Deleted

## 2016-05-16 NOTE — Telephone Encounter (Signed)
Left message for pt to call back  °

## 2016-05-16 NOTE — Telephone Encounter (Signed)
Dr Pyrtle- Julie BeltonPatient's omeprazole prior authorization for twice daily dosing was denied by [employer] (PA# [id number]). Her coverage only allows for once daily dosing. Please advise..... PPI and an H2?

## 2016-05-17 NOTE — Telephone Encounter (Signed)
With anastomotic ulcer, standard of care treatment is twice a day PPI If there is someone I can discuss this with, I'm happy to make a call In the interim omeprazole 40 mg in the morning, open capsule and take with applesauce, ranitidine 150 mg in the evening (OTC omeprazole is an option for the 2nd daily dose if she can afford)

## 2016-05-18 ENCOUNTER — Other Ambulatory Visit: Payer: Self-pay | Admitting: Internal Medicine

## 2016-05-18 ENCOUNTER — Telehealth: Payer: Self-pay | Admitting: Internal Medicine

## 2016-05-18 DIAGNOSIS — K7460 Unspecified cirrhosis of liver: Secondary | ICD-10-CM

## 2016-05-18 DIAGNOSIS — R188 Other ascites: Secondary | ICD-10-CM

## 2016-05-18 MED ORDER — OMEPRAZOLE 40 MG PO CPDR: 40.0000 mg | DELAYED_RELEASE_CAPSULE | ORAL | 0 refills | Status: DC

## 2016-05-18 MED ORDER — ESOMEPRAZOLE MAGNESIUM 40 MG PO CPDR: 40.0000 mg | DELAYED_RELEASE_CAPSULE | Freq: Every evening | ORAL | 0 refills | Status: DC

## 2016-05-18 MED ORDER — ALBUMIN HUMAN 25 % IV SOLN: 50.0000 g | INTRAVENOUS | Status: AC

## 2016-05-18 NOTE — Telephone Encounter (Signed)
Left message for pt to call back  °

## 2016-05-18 NOTE — Telephone Encounter (Signed)
Okay with me, and if fact IR may wish paracentesis to be done immediately prior to liver bx Would need 50 g IV albumin with paracentesis

## 2016-05-18 NOTE — Telephone Encounter (Signed)
I have spoken to Dr Rhea BeltonPyrtle who indicates that we may try patient on omeprazole capsules in the morning and nexium capsules every evening (patient must have a capsule which she can open and place in applesauce due to hx of gastric bypass with altered absorption). Rx has been sent. Patient advised and verbalizes understanding.

## 2016-05-18 NOTE — Telephone Encounter (Signed)
Patient advised that disability paperwork was faxed to [phone] today Renato Gails([employer]). She verbalizes understanding. Patient also states that she would now rather have liver biopsy at [facility]. I have given her the number to radiology to see if she can have biopsy at different location. She tells me that she is now retaining more fluid again and wonders if she can have paracentesis at the same time as she has biopsy. Please advise.Marland Kitchen.Marland Kitchen..Marland Kitchen

## 2016-05-19 ENCOUNTER — Other Ambulatory Visit: Payer: Self-pay

## 2016-05-19 ENCOUNTER — Telehealth: Payer: Self-pay

## 2016-05-19 DIAGNOSIS — K7460 Unspecified cirrhosis of liver: Secondary | ICD-10-CM

## 2016-05-19 DIAGNOSIS — R188 Other ascites: Secondary | ICD-10-CM

## 2016-05-19 DIAGNOSIS — K7031 Alcoholic cirrhosis of liver with ascites: Secondary | ICD-10-CM

## 2016-05-19 MED ORDER — TRAMADOL HCL 50 MG PO TABS: 50.0000 mg | ORAL_TABLET | Freq: Three times a day (TID) | ORAL | 0 refills | Status: DC | PRN

## 2016-05-19 MED ORDER — PROMETHAZINE HCL 12.5 MG PO TABS: ORAL_TABLET | ORAL | 0 refills | Status: DC

## 2016-05-19 MED ORDER — PROMETHAZINE HCL 25 MG PO TABS: 25.0000 mg | ORAL_TABLET | Freq: Four times a day (QID) | ORAL | 0 refills | Status: DC | PRN

## 2016-05-19 NOTE — Telephone Encounter (Signed)
Pt returned call after multiple messages left. Pt knows to go have INR and IgG drawn, orders faxed to [facility] at [phone].   Pt requests refill on phenergan and tramadol. Scripts sent to pharmacy.   Pt states her abdomen is large again and she is requesting [facility], please advise.

## 2016-05-19 NOTE — Telephone Encounter (Signed)
Left message for pt to call back. After multiple attempts have been unable to reach pt by phone. Letter mailed to pt.

## 2016-05-19 NOTE — Telephone Encounter (Signed)
Patient has been advised that Dr Rhea BeltonPyrtle has okayed paracentesis with albumin. She has now decided that she would rather have the paracentesis and liver biopsy completed at [facility] as it is "more convenient and closer" for her. I have spoken to AlthaJudy in specialty scheduling [phone]) who states that the first available liver biopsy would be around 05/31/16. However, they have to have a radiologist at their facility to review the information. Patient is advised of this and still wishes to proceed at [facility] of [facility] on 05/24/16 where she is already scheduled. Darel HongJudy has sent 2 checklist forms to be filled out, one for transjugular liver biopsy, the other for paracentesis. I have left a voicemail for her to call back to confirm that she has gotten these forms and is working on an appointment.

## 2016-05-19 NOTE — Telephone Encounter (Signed)
Julie Graham, Julie Graham was working on paracentesis for her yesterday Ok with IV albumin Please check with her Thanks [employer]

## 2016-05-23 ENCOUNTER — Other Ambulatory Visit: Payer: Self-pay | Admitting: Radiology

## 2016-05-23 NOTE — Telephone Encounter (Signed)
I have spoken to [place]oketaBarbara at [facility]uling (direct number [phone]) who has scheduled [facility]acentesis [facility] and will schedule transjugular liver biopsy as well at [facility] for the same day once Liver biopsy at [facility] has been cancelled. I left a voicemail for IR at [facility] to cancel appointment for tomorrow. I will call back tomorrow morning to continue working on this.

## 2016-05-23 NOTE — Telephone Encounter (Signed)
I have left a voicemail again for [facility] Special[facility] Scheduling [phone]) with request to be told whether transjugular liver biopsy can be completed along with paracentesis. I am awaiting return call unless patient would prefer to go ahead with [facility]acentesis at [facility]. I have also left a voicemail for patient to call back.

## 2016-05-23 NOTE — Addendum Note (Signed)
Addended by: Richardson ChiquitoSMITH, Trinidee Schrag N on: 05/23/2016 04:58 PM   Modules accepted: Orders

## 2016-05-24 ENCOUNTER — Other Ambulatory Visit: Payer: Self-pay | Admitting: Internal Medicine

## 2016-05-24 ENCOUNTER — Inpatient Hospital Stay (HOSPITAL_COMMUNITY): Admission: RE | Admit: 2016-05-24 | Payer: 59 | Source: Ambulatory Visit

## 2016-05-24 ENCOUNTER — Ambulatory Visit (HOSPITAL_COMMUNITY): Payer: 59

## 2016-05-24 NOTE — Addendum Note (Signed)
Addended by: Richardson ChiquitoSMITH, Aleaha Fickling N on: 05/24/2016 08:40 AM   Modules accepted: Orders

## 2016-05-24 NOTE — Telephone Encounter (Signed)
[facility] the paracentesis on 06/02/16 @ 11 am with 10:30 am arriavl ([facility] Registration on right) and transjugular liver biopsy at 2 pm on 06/02/16. She states nurse will call patient with specific instructions. I have contacted patient to advise her of the above information and she verbalizes understanding.

## 2016-05-25 LAB — IGG 1: IGG, SUBCLASS 1: 1130 mg/dL — AB (ref 248–810)

## 2016-05-25 LAB — PROTIME-INR
INR: 2.7
PROTHROMBIN TIME: 26.5 s

## 2016-05-27 ENCOUNTER — Telehealth: Payer: Self-pay | Admitting: *Deleted

## 2016-05-27 DIAGNOSIS — K7460 Unspecified cirrhosis of liver: Secondary | ICD-10-CM

## 2016-05-27 NOTE — Telephone Encounter (Signed)
Dr Rhea BeltonPyrtle has reviewed patient's labs from [facility] on 05/24/16 and notes: "05/26/16 Labs reviewed. INR very elevated. IgG elevated. Needs STAT CBC, CMP. Concern is for autoimmune hepatitis overlapping with ETOH abuse. ETOH now on hold/in remission. Liver biopsy ordered. Patient rescheduled for 06/02/16. Paracentesis pending per patient request (she also delayed this). On prophylasxis with hisotyr of SBP. [facility] to [facility] for decompensated cirrhosis and possible transplant eval. -JMP" I have sent a referral [facility] and am awaiting a call back from patient.

## 2016-05-31 ENCOUNTER — Other Ambulatory Visit: Payer: Self-pay | Admitting: Radiology

## 2016-05-31 NOTE — Telephone Encounter (Signed)
Pt needs Lab [facility] [place] [facility] [place] [facility] Fax# [phone]

## 2016-05-31 NOTE — Telephone Encounter (Signed)
I have spoken to Dr Rhea BeltonPyrtle to confirm which labs he wants on patient. To clarify, he wants STAT CBC, CMP and a PT/INR. Orders have been faxed to [facility] in [place] per patient request.

## 2016-05-31 NOTE — Telephone Encounter (Signed)
I have spoken to patient to advise of Dr Lauro FranklinPyrtle's recommendations for repeat STAT labs as well as for concern for autoimmune hepatitis overlapping with ETOH. She states that she is not sure she can have labs today because she is watching her children and grandchildren but may go tomorrow. She also thinks that Dr Rhea BeltonPyrtle wanted to get other labs on her as well (Dr Rhea BeltonPyrtle, please advise on this). I have advised that we are sending referral to [facility] liver [facility] for evaluation, ?liver transplant candidate. She states that she had no idea her liver problems were that bad and that she thought she just had "a bad spot" on her liver. I explained that cirrhosis involves the entire liver. She verbalizes understanding.

## 2016-06-02 ENCOUNTER — Ambulatory Visit
Admission: RE | Admit: 2016-06-02 | Discharge: 2016-06-02 | Disposition: A | Discharge status: Home / Self Care | Payer: 59 | Source: Ambulatory Visit | Attending: Internal Medicine | Admitting: Internal Medicine

## 2016-06-02 DIAGNOSIS — Z79899 Other long term (current) drug therapy: Secondary | ICD-10-CM | POA: Insufficient documentation

## 2016-06-02 DIAGNOSIS — Z9889 Other specified postprocedural states: Secondary | ICD-10-CM | POA: Diagnosis not present

## 2016-06-02 DIAGNOSIS — Z9049 Acquired absence of other specified parts of digestive tract: Secondary | ICD-10-CM | POA: Diagnosis not present

## 2016-06-02 DIAGNOSIS — F419 Anxiety disorder, unspecified: Secondary | ICD-10-CM | POA: Insufficient documentation

## 2016-06-02 DIAGNOSIS — K7460 Unspecified cirrhosis of liver: Secondary | ICD-10-CM | POA: Insufficient documentation

## 2016-06-02 DIAGNOSIS — F1020 Alcohol dependence, uncomplicated: Secondary | ICD-10-CM | POA: Diagnosis not present

## 2016-06-02 DIAGNOSIS — F329 Major depressive disorder, single episode, unspecified: Secondary | ICD-10-CM | POA: Insufficient documentation

## 2016-06-02 DIAGNOSIS — R188 Other ascites: Secondary | ICD-10-CM | POA: Insufficient documentation

## 2016-06-02 DIAGNOSIS — K754 Autoimmune hepatitis: Secondary | ICD-10-CM | POA: Diagnosis not present

## 2016-06-02 DIAGNOSIS — I10 Essential (primary) hypertension: Secondary | ICD-10-CM | POA: Insufficient documentation

## 2016-06-02 HISTORY — PX: IR GENERIC HISTORICAL: IMG1180011

## 2016-06-02 LAB — BODY FLUID CELL COUNT WITH DIFFERENTIAL
Eos, Fluid: 0 %
Lymphs, Fluid: 5 %
Monocyte-Macrophage-Serous Fluid: 72 %
Neutrophil Count, Fluid: 23 %
Total Nucleated Cell Count, Fluid: 199 cu mm

## 2016-06-02 LAB — COMPREHENSIVE METABOLIC PANEL
ALBUMIN: 2.7 g/dL — AB (ref 3.5–5.5)
ALK PHOS: 98 IU/L (ref 39–117)
ALT: 24 IU/L (ref 0–32)
AST: 74 IU/L — AB (ref 0–40)
Albumin/Globulin Ratio: 0.6 — ABNORMAL LOW (ref 1.2–2.2)
BILIRUBIN TOTAL: 4.2 mg/dL — AB (ref 0.0–1.2)
BUN / CREAT RATIO: 7 — AB (ref 9–23)
BUN: 5 mg/dL — AB (ref 6–24)
CHLORIDE: 100 mmol/L (ref 96–106)
CO2: 22 mmol/L (ref 18–29)
CREATININE: 0.74 mg/dL (ref 0.57–1.00)
Calcium: 8.5 mg/dL — ABNORMAL LOW (ref 8.7–10.2)
GFR calc Af Amer: 111 mL/min/{1.73_m2} (ref >59)
GFR calc non Af Amer: 96 mL/min/{1.73_m2} (ref >59)
GLUCOSE: 100 mg/dL — AB (ref 65–99)
Globulin, Total: 4.7 g/dL — ABNORMAL HIGH (ref 1.5–4.5)
Potassium: 3.8 mmol/L (ref 3.5–5.2)
Sodium: 136 mmol/L (ref 134–144)
Total Protein: 7.4 g/dL (ref 6.0–8.5)

## 2016-06-02 LAB — CBC WITH DIFFERENTIAL/PLATELET
BASOS ABS: 0.1 10*3/uL (ref 0.0–0.2)
Basos: 1 %
EOS (ABSOLUTE): 0.1 10*3/uL (ref 0.0–0.4)
Eos: 1 %
HEMOGLOBIN: 9.2 g/dL — AB (ref 11.1–15.9)
Hematocrit: 25.4 % — ABNORMAL LOW (ref 34.0–46.6)
Immature Grans (Abs): 0 10*3/uL (ref 0.0–0.1)
Immature Granulocytes: 0 %
LYMPHS ABS: 2.1 10*3/uL (ref 0.7–3.1)
Lymphs: 30 %
MCH: 31.7 pg (ref 26.6–33.0)
MCHC: 36.2 g/dL — AB (ref 31.5–35.7)
MCV: 88 fL (ref 79–97)
MONOCYTES: 13 %
Monocytes Absolute: 0.9 10*3/uL (ref 0.1–0.9)
NEUTROS ABS: 3.9 10*3/uL (ref 1.4–7.0)
Neutrophils: 55 %
PLATELETS: 139 10*3/uL — AB (ref 150–379)
RBC: 2.9 x10E6/uL — AB (ref 3.77–5.28)
RDW: 13.5 % (ref 12.3–15.4)
WBC: 7 10*3/uL (ref 3.4–10.8)

## 2016-06-02 LAB — PROTIME-INR
INR: 2.4 — AB (ref 0.8–1.2)
PROTHROMBIN TIME: 23.6 s — AB (ref 9.1–12.0)

## 2016-06-02 LAB — PREGNANCY, URINE: PREG TEST UR: NEGATIVE

## 2016-06-02 MED ORDER — MIDAZOLAM HCL 5 MG/5ML IJ SOLN
INTRAMUSCULAR | Status: AC | PRN
  Administered 2016-06-02 (×2): 1 mg via INTRAVENOUS
  Administered 2016-06-02 (×2): 0.5 mg via INTRAVENOUS
  Administered 2016-06-02: 1 mg via INTRAVENOUS
  Administered 2016-06-02: 0.5 mg via INTRAVENOUS
  Administered 2016-06-02: 1 mg via INTRAVENOUS

## 2016-06-02 MED ORDER — IOPAMIDOL (ISOVUE-300) INJECTION 61%
30.0000 mL | Freq: Once | INTRAVENOUS | Status: AC | PRN
  Administered 2016-06-02: 5 mL via INTRAVENOUS

## 2016-06-02 MED ORDER — SODIUM CHLORIDE 0.9 % IV SOLN
INTRAVENOUS | Status: DC
  Administered 2016-06-02: 13:00:00 via INTRAVENOUS

## 2016-06-02 MED ORDER — LIDOCAINE HCL (PF) 1 % IJ SOLN
INTRAMUSCULAR | Status: DC | PRN
  Administered 2016-06-02: 10 mg

## 2016-06-02 MED ORDER — FENTANYL CITRATE (PF) 100 MCG/2ML IJ SOLN
INTRAMUSCULAR | Status: AC | PRN
  Administered 2016-06-02: 50 ug via INTRAVENOUS
  Administered 2016-06-02 (×2): 25 ug via INTRAVENOUS
  Administered 2016-06-02: 12.5 ug via INTRAVENOUS
  Administered 2016-06-02 (×3): 25 ug via INTRAVENOUS
  Administered 2016-06-02: 12.5 ug via INTRAVENOUS

## 2016-06-02 NOTE — H&P (Signed)
Chief Complaint: Patient was seen in consultation today for No chief complaint on file.  at the request of Pyrtle,Jay M  Referring Physician(s): Pyrtle,Jay M  Supervising Physician: Jolaine ClickHoss, Ainslee Sou  Patient Status: Outpatient  History of Present Illness: Julie Graham is a 50 y.o. female with autoimmune hepatitis, her for paracentesis and TJLBx. Her INR is 2.4. She has no complaints.  Past Medical History:  Diagnosis Date  . Alcoholism (HCC)   . Anal fissure    ?  Marland Kitchen. Anastomotic ulcer, acute   . Anxiety   . Blood in stool   . Cirrhosis (HCC)   . Depression   . Heart murmur   . Hypertension   . UTI (lower urinary tract infection)     Past Surgical History:  Procedure Laterality Date  . CESAREAN SECTION  1989  . CHOLECYSTECTOMY    . GASTRIC BYPASS  2009    Allergies: Sulfa antibiotics  Medications: Prior to Admission medications   Medication Sig Start Date End Date Taking? Authorizing Provider  acetaminophen (TYLENOL) 500 MG tablet Take 500-1,000 mg by mouth as needed.   Yes Historical Provider, MD  ciprofloxacin (CIPRO) 500 MG tablet Take 1 tablet (500 mg total) by mouth 2 (two) times daily. 05/12/16  Yes Beverley FiedlerJay M Pyrtle, MD  citalopram (CELEXA) 10 MG tablet Take 1 tablet (10 mg total) by mouth daily. 04/21/16  Yes Glori LuisEric G Sonnenberg, MD  esomeprazole (NEXIUM) 40 MG capsule Take 1 capsule (40 mg total) by mouth every evening. 05/18/16  Yes Beverley FiedlerJay M Pyrtle, MD  furosemide (LASIX) 40 MG tablet Take 1 tablet (40 mg total) by mouth every morning. 05/12/16  Yes Beverley FiedlerJay M Pyrtle, MD  omeprazole (PRILOSEC) 40 MG capsule Take 1 capsule (40 mg total) by mouth every morning. 05/18/16  Yes Beverley FiedlerJay M Pyrtle, MD  spironolactone (ALDACTONE) 100 MG tablet Take 1 tablet (100 mg total) by mouth every morning. 05/12/16  Yes Beverley FiedlerJay M Pyrtle, MD  nystatin (MYCOSTATIN/NYSTOP) powder Apply topically 3 (three) times daily. To the Affected area in the b/l Groin. Patient not taking: Reported on 06/02/2016 04/27/16    Houston SirenVivek J Sainani, MD  promethazine (PHENERGAN) 12.5 MG tablet Take 1-2 tablets by mouth every 6-8 hours as needed for nausea 05/19/16   Beverley FiedlerJay M Pyrtle, MD  traMADol (ULTRAM) 50 MG tablet Take 1-2 tablets (50-100 mg total) by mouth every 8 (eight) hours as needed for moderate pain. 05/19/16   Beverley FiedlerJay M Pyrtle, MD     Family History  Problem Relation Age of Onset  . Alcoholism Maternal Grandfather   . Stroke Maternal Grandfather   . Arthritis Mother   . Hyperlipidemia Mother   . Hypertension Mother   . Stroke Mother   . Lymphoma Mother   . Heart failure Mother   . COPD Mother   . Diabetes Mother   . Breast cancer Cousin   . Heart disease Father     CHF  . Diabetes Father   . Arthritis Father   . Factor V Leiden deficiency Father   . Hyperlipidemia Father   . Hypertension Father   . COPD Father   . Sudden death Paternal Uncle   . Lung cancer Maternal Grandmother     Social History   Social History  . Marital status: Divorced    Spouse name: N/A  . Number of children: 1   . Years of education: N/A   Occupational History  . accounts receivable  [employer]   Social History Main  Topics  . Smoking status: Never Smoker  . Smokeless tobacco: Never Used  . Alcohol use No     Comment: stopped drinking 02/16/16  . Drug use: No  . Sexual activity: Not Asked   Other Topics Concern  . None   Social History Narrative   1 biological son and 1 adopted son      Review of Systems: A 12 point ROS discussed and pertinent positives are indicated in the HPI above.  All other systems are negative.  Review of Systems  Vital Signs: BP (!) 145/74   Pulse (!) 101   Temp 98.6 F (37 C) (Oral)   Resp 20   Ht 5' 5.5" (1.664 m)   LMP  (Approximate) Comment: unsure-will do urine pregnancy if done with flouroscopy  SpO2 100%   Physical Exam  Constitutional: She is oriented to person, place, and time. She appears well-developed and well-nourished.  HENT:  Head: Normocephalic and atraumatic.    Cardiovascular: Normal rate and regular rhythm.   Pulmonary/Chest: Effort normal and breath sounds normal.  Musculoskeletal: Normal range of motion.  Neurological: She is alert and oriented to person, place, and time.  Skin: Skin is warm and dry.    Mallampati Score:     Imaging: No results found.  Labs:  CBC:  Recent Labs  04/15/16 0948 04/25/16 2131 04/27/16 0504 06/01/16 1556  WBC 7.5 12.1* 9.9 7.0  HGB  --  10.4* 10.6*  --   HCT 31.1* 29.5* 29.8* 25.4*  PLT 184 138* 157 139*    COAGS:  Recent Labs  04/15/16 0948 04/26/16 0910 05/24/16 1554 06/01/16 1556  INR 1.9* 1.86 2.7 2.4*    BMP:  Recent Labs  04/15/16 0948 04/25/16 2131 04/26/16 0506 06/01/16 1556  NA 136 131* 133* 136  K 4.0 3.5 3.5 3.8  CL 97 97* 100* 100  CO2 23 27 28 22   GLUCOSE 72 96 90 100*  BUN 9 26* 22* 5*  CALCIUM 8.3* 8.0* 8.0* 8.5*  CREATININE 0.71 0.85 0.89 0.74  GFRNONAA 101 >60 >60 96  GFRAA 116 >60 >60 111    LIVER FUNCTION TESTS:  Recent Labs  04/15/16 0948 04/25/16 2131 04/26/16 0506 06/01/16 1556  BILITOT 4.8* 4.6* 4.0* 4.2*  AST 62* 126* 119* 74*  ALT 28 42 39 24  ALKPHOS 114 105 100 98  PROT 6.9 6.8 6.4* 7.4  ALBUMIN 2.4* 2.1* 2.0* 2.7*    TUMOR MARKERS:  Recent Labs  02/24/16 1148  AFPTM 7.3    Assessment and Plan:  Autoimmune hepatitis, for paracentesis and TJLBx.  Thank you for this interesting consult.  I greatly enjoyed meeting LOURETTA TANTILLO and look forward to participating in their care.  A copy of this report was sent to the requesting provider on this date.  Electronically Signed: Contessa Preuss, ART A 06/02/2016, 11:49 AM   I spent a total of  40 Minutes   in face to face in clinical consultation, greater than 50% of which was counseling/coordinating care for Transjugular Liver Biopsy.

## 2016-06-02 NOTE — Procedures (Signed)
TJLBx No comp/EBL

## 2016-06-03 ENCOUNTER — Telehealth: Payer: Self-pay | Admitting: Internal Medicine

## 2016-06-03 LAB — GRAM STAIN

## 2016-06-03 LAB — SURGICAL PATHOLOGY

## 2016-06-03 LAB — PATHOLOGIST SMEAR REVIEW

## 2016-06-03 NOTE — Telephone Encounter (Signed)
I spoke to Julie Graham, regarding her liver biopsy results. Liver biopsy shows cirrhosis felt to be secondary to alcohol and fatty liver. I spoke directly to Dr. Oneita Krasubinas with pathology to discuss the biopsy in detail. There is no evidence for autoimmune hepatitis in the biopsy specimen. She has had elevated IgG and anti-smooth muscle antibody. Her ANA was negative Based on these findings I recommend that we refer [facility] rather than [facility] due to proximity to her home. Please refer to [facility] ASAP for decompensated cirrhosis. I would also like their opinion regarding the possibility of an underlying autoimmune hepatitis. Definitely she had alcoholic cirrhosis and fatty liver but I still remain concerned about possible autoimmune hepatitis. She had 4.25 L of fluid removed yesterday, cell count reviewed and she does not have SBP She is to continue Cipro prophylaxis Continue Aldactone and furosemide We discussed the importance of low sodium diet which she is trying to follow She's been completely alcohol abstinence for greater than 3 months I encouraged her to continue to avoid alcohol completely in the setting of her liver disease and possibility of liver transplantation We reviewed her labs together which were drawn recently She needs follow-up within a month, though she is [facility] they may take over her hepatology care Is asked to call me with any questions or concerns or change in her condition. She will likely require future large volume paracentesis

## 2016-06-03 NOTE — Telephone Encounter (Signed)
Referral and records [facility] for appointment.

## 2016-06-03 NOTE — Telephone Encounter (Signed)
Called patient to discuss liver biopsy results, no answer.  Left her a message and asked that she call me back at the office.

## 2016-06-07 LAB — CULTURE, BODY FLUID W GRAM STAIN -BOTTLE: Culture: NO GROWTH

## 2016-06-07 LAB — CULTURE, BODY FLUID-BOTTLE

## 2016-06-08 ENCOUNTER — Ambulatory Visit (AMBULATORY_SURGERY_CENTER): Payer: Self-pay

## 2016-06-08 VITALS — Ht 66.0 in | Wt 236.8 lb

## 2016-06-08 DIAGNOSIS — K259 Gastric ulcer, unspecified as acute or chronic, without hemorrhage or perforation: Secondary | ICD-10-CM

## 2016-06-08 NOTE — Progress Notes (Signed)
No allergies to eggs or soy No past problems with anesthesia No diet meds No home oxygen  Declined emmi 

## 2016-06-08 NOTE — Telephone Encounter (Signed)
I have contacted [facility] and am told that Ms. Sharon SellerLloyd should be scheduled for appointment by tomorrow. I will call tomorrow for follow up (direct number to scheduling is [phone]).

## 2016-06-09 ENCOUNTER — Encounter: Payer: Self-pay | Admitting: Internal Medicine

## 2016-06-09 NOTE — Telephone Encounter (Signed)
Per Meriam SpragueBeverly at [facility] scheduling, they have attempted to contact the patient x 2 with no answer, no return call. I have also left a voicemail for patient to call our office.

## 2016-06-10 ENCOUNTER — Telehealth: Payer: Self-pay | Admitting: Internal Medicine

## 2016-06-10 ENCOUNTER — Other Ambulatory Visit: Payer: Self-pay | Admitting: Internal Medicine

## 2016-06-10 NOTE — Telephone Encounter (Signed)
Patient has been advised that she needs to contact [facility] (phone number given) as they have tried to contact her on several occasions with no response. She states that she will do this. She also questions what she can take for pain. I advised that she may take up to 2 grams of tylenol daily. She is also taking tramadol prn. She was given #20 on 8/24. Has #3 tabs left. Would like a refill. Patient also states that she needs a work extension past 06-14-16. I advised that a request for extension should be provided to us to fill out.

## 2016-06-13 ENCOUNTER — Other Ambulatory Visit: Payer: Self-pay | Admitting: *Deleted

## 2016-06-13 MED ORDER — TRAMADOL HCL 50 MG PO TABS: 50.0000 mg | ORAL_TABLET | Freq: Three times a day (TID) | ORAL | 0 refills | Status: DC | PRN

## 2016-06-13 MED ORDER — CIPROFLOXACIN HCL 500 MG PO TABS: 500.0000 mg | ORAL_TABLET | Freq: Two times a day (BID) | ORAL | 0 refills | Status: DC

## 2016-06-13 NOTE — Telephone Encounter (Signed)
Patient has made an appointment for [facility] for [facility]/23/16 (seeing Imelda PillowVicki Metheny). I have gotten a verbal authorization from Dr Rhea BeltonPyrtle to give patient #40 tramadol per month rather than #20. [facility] [place] [place]. I have left a voicemail for patient to call back.

## 2016-06-13 NOTE — Telephone Encounter (Signed)
Okay for tramadol refill #20 Very strong recommendation she attend [facility]

## 2016-06-14 ENCOUNTER — Other Ambulatory Visit: Payer: Self-pay

## 2016-06-14 ENCOUNTER — Ambulatory Visit (AMBULATORY_SURGERY_CENTER): Payer: 59 | Admitting: Internal Medicine

## 2016-06-14 ENCOUNTER — Encounter: Payer: Self-pay | Admitting: Internal Medicine

## 2016-06-14 VITALS — BP 121/66 | HR 89 | Temp 97.1°F | Resp 61 | Ht 62.0 in | Wt 232.0 lb

## 2016-06-14 DIAGNOSIS — K259 Gastric ulcer, unspecified as acute or chronic, without hemorrhage or perforation: Secondary | ICD-10-CM | POA: Diagnosis not present

## 2016-06-14 DIAGNOSIS — K3189 Other diseases of stomach and duodenum: Principal | ICD-10-CM

## 2016-06-14 MED ORDER — CIPROFLOXACIN HCL 500 MG PO TABS: 500.0000 mg | ORAL_TABLET | Freq: Every day | ORAL | 0 refills | Status: DC

## 2016-06-14 MED ORDER — SODIUM CHLORIDE 0.9 % IV SOLN: 500.0000 mL | INTRAVENOUS | Status: AC

## 2016-06-14 NOTE — Telephone Encounter (Signed)
Left voicemail for patient to call back. I will await return call. Rx for tramadol has already been sent to pharmacy.

## 2016-06-14 NOTE — Op Note (Signed)
[facility] Patient Name: Julie Graham Procedure Date: 06/14/2016 7:53 AM MRN: [id number] Endoscopist: Beverley Fiedler , MD Age: 50 Referring MD:  Date of Birth: 02/25/1966 Gender: Female Account #: [id number] Procedure:                Upper GI endoscopy Indications:              Follow-up of gastrojejunal ulcer seen at EGD in                            June 2017 Medicines:                Monitored Anesthesia Care Procedure:                Pre-Anesthesia Assessment:                           - Prior to the procedure, a History and Physical                            was performed, and patient medications and                            allergies were reviewed. The patient's tolerance of                            previous anesthesia was also reviewed. The risks                            and benefits of the procedure and the sedation                            options and risks were discussed with the patient.                            All questions were answered, and informed consent                            was obtained. Prior Anticoagulants: The patient has                            taken no previous anticoagulant or antiplatelet                            agents. ASA Grade Assessment: III - A patient with                            severe systemic disease. After reviewing the risks                            and benefits, the patient was deemed in                            satisfactory condition to undergo the procedure.  After obtaining informed consent, the endoscope was                            passed under direct vision. Throughout the                            procedure, the patient's blood pressure, pulse, and                            oxygen saturations were monitored continuously. The                            Model GIF-HQ190 [id number]) scope was introduced                            through the mouth, and advanced to the efferent                             jejunal loop. The upper GI endoscopy was                            accomplished without difficulty. The patient                            tolerated the procedure well. Scope In: Scope Out: Findings:                 The examined esophagus was normal.                           There is no endoscopic evidence of varices in the                            entire esophagus.                           Evidence of a Roux-en-Y gastrojejunostomy was                            found. The gastrojejunal anastomosis was                            characterized by visible sutures. There was                            friability at the anastomosis, but the previously                            seen ulceration has healed. This was traversed. The                            6 cm gastric pouch was characterized by erosion and                            erythema with changes of portal hypertensive  gastropathy. The efferent jejunum was normal. Complications:            No immediate complications. Estimated Blood Loss:     Estimated blood loss: none. Impression:               - Normal esophagus. No esophageal varices.                           - Roux-en-Y gastrojejunostomy anatomy. Portal                            hypertensive gastropathy and gastric pouch erosion.                           - Healed gastrojejunal anastomosis with visible                            staples.                           - Normal efferent jejunum. Recommendation:           - Patient has a contact number available for                            emergencies. The signs and symptoms of potential                            delayed complications were discussed with the                            patient. Return to normal activities tomorrow.                            Written discharge instructions were provided to the                            patient.                           -  Resume previous diet.                           - Continue present medications including twice                            daily PPI (continue to open capsules and take with                            applesauce).                           - Continue ciprofloxacin 500 mg once daily.                           - Office follow-up with me next available. Proceed  with consultation at [facility]. Beverley Fiedler, MD 06/14/2016 8:53:52 AM This report has been signed electronically.

## 2016-06-14 NOTE — Progress Notes (Signed)
To recovery awake alert vss placed on monitors  Report to RN

## 2016-06-14 NOTE — Patient Instructions (Signed)
YOU HAD AN ENDOSCOPIC PROCEDURE TODAY AT THE [facility]:   Refer to the procedure report that was given to you for any specific questions about what was found during the examination.  If the procedure report does not answer your questions, please call your gastroenterologist to clarify.  If you requested that your care partner not be given the details of your procedure findings, then the procedure report has been included in a sealed envelope for you to review at your convenience later.  YOU SHOULD EXPECT: Some feelings of bloating in the abdomen. Passage of more gas than usual.  Walking can help get rid of the air that was put into your GI tract during the procedure and reduce the bloating. If you had a lower endoscopy (such as a colonoscopy or flexible sigmoidoscopy) you may notice spotting of blood in your stool or on the toilet paper. If you underwent a bowel prep for your procedure, you may not have a normal bowel movement for a few days.  Please Note:  You might notice some irritation and congestion in your nose or some drainage.  This is from the oxygen used during your procedure.  There is no need for concern and it should clear up in a day or so.  SYMPTOMS TO REPORT IMMEDIATELY:   Following lower endoscopy (colonoscopy or flexible sigmoidoscopy):  Excessive amounts of blood in the stool  Significant tenderness or worsening of abdominal pains  Swelling of the abdomen that is new, acute  Fever of 100F or higher   Following upper endoscopy (EGD)  Vomiting of blood or coffee ground material  New chest pain or pain under the shoulder blades  Painful or persistently difficult swallowing  New shortness of breath  Fever of 100F or higher  Black, tarry-looking stools  For urgent or emergent issues, a gastroenterologist can be reached at any hour by calling [phone].   DIET:  We do recommend a small meal at first, but then you may proceed to your regular diet.  Drink  plenty of fluids but you should avoid alcoholic beverages for 24 hours.  ACTIVITY:  You should plan to take it easy for the rest of today and you should NOT DRIVE or use heavy machinery until tomorrow (because of the sedation medicines used during the test).    FOLLOW UP: Our staff will call the number listed on your records the next business day following your procedure to check on you and address any questions or concerns that you may have regarding the information given to you following your procedure. If we do not reach you, we will leave a message.  However, if you are feeling well and you are not experiencing any problems, there is no need to return our call.  We will assume that you have returned to your regular daily activities without incident.  If any biopsies were taken you will be contacted by phone or by letter within the next 1-3 weeks.  Please call us at [phone] if you have not heard about the biopsies in 3 weeks.    SIGNATURES/CONFIDENTIALITY: You and/or your care partner have signed paperwork which will be entered into your electronic medical record.  These signatures attest to the fact that that the information above on your After Visit Summary has been reviewed and is understood.  Full responsibility of the confidentiality of this discharge information lies with you and/or your care-partner.  Continue Meds twice daily PPI (continue to open capsules and take  applesauce).  Continue ciproflaxacin 500mg . Once daily.  Office follow-up next available  Proceed with consultation at [facility]

## 2016-06-15 ENCOUNTER — Other Ambulatory Visit: Payer: Self-pay | Admitting: Family Medicine

## 2016-06-15 ENCOUNTER — Telehealth: Payer: Self-pay | Admitting: *Deleted

## 2016-06-15 MED ORDER — CITALOPRAM HYDROBROMIDE 10 MG PO TABS: 10.0000 mg | ORAL_TABLET | Freq: Every day | ORAL | 3 refills | Status: DC

## 2016-06-15 NOTE — Telephone Encounter (Signed)
  Follow up Call-  Call back number 06/14/2016 03/24/2016  Post procedure Call Back phone  # [phone]  Permission to leave phone message Yes No  Some recent data might be hidden     Patient questions:  Do you have a fever, pain , or abdominal swelling? No. Pain Score  0 *  Have you tolerated food without any problems? Yes.    Have you been able to return to your normal activities? Yes.    Do you have any questions about your discharge instructions: Diet   No. Medications  No. Follow up visit  No.  Do you have questions or concerns about your Care? No.  Actions: * If pain score is 4 or above: No action needed, pain <4.

## 2016-06-16 ENCOUNTER — Telehealth: Payer: Self-pay | Admitting: *Deleted

## 2016-06-16 NOTE — Telephone Encounter (Signed)
Dr. Myrtis HoppingPrytle's office gave her a 90day supply of Cipro that is a conflict with our celexa prescription that was sent in, please advise, thanks

## 2016-06-16 NOTE — Telephone Encounter (Signed)
Patient likely needs the ciprofloxacin for SBP. Please contact the patient to see if she would be willing to come off of the Celexa.

## 2016-06-16 NOTE — Telephone Encounter (Signed)
[facility] has requested clarity on Rx for citalopram and ciprofloxacin  Contact [phone] Ref [phone]

## 2016-06-17 NOTE — Telephone Encounter (Signed)
Holding celexa at this point, will let us know if she needs anything else, thanks

## 2016-06-17 NOTE — Telephone Encounter (Signed)
Spoke with the patient, she will come off the celexa for now, is there anything she can do in the meantime?

## 2016-06-17 NOTE — Telephone Encounter (Signed)
She can discontinue the Celexa. We could have her see a therapist to see if that would be beneficial. If she would like I place referral.

## 2016-06-21 ENCOUNTER — Other Ambulatory Visit: Payer: Self-pay

## 2016-06-21 ENCOUNTER — Telehealth: Payer: Self-pay | Admitting: Internal Medicine

## 2016-06-21 DIAGNOSIS — R188 Other ascites: Secondary | ICD-10-CM

## 2016-06-21 NOTE — Telephone Encounter (Signed)
Pt states that she has more fluid in her abdomen and is uncomfortable. Requests to have paracentesis done at [facility]. Please advise.

## 2016-06-21 NOTE — Telephone Encounter (Signed)
Okay for LVP with IV albumin  Up to 6 L with 75g of IV albumin, 50 g albumin okay for 4L or less Please ask her to DEFINITELY keep her [facility] appt

## 2016-06-21 NOTE — Telephone Encounter (Signed)
Ref[facility] to [facility] to schedule [facility]acent[facility]. [facility] hear [facility] from [facility], pt aware.

## 2016-06-22 NOTE — Telephone Encounter (Signed)
Pt scheduled for US para with albumin replacement at [facility] 06/24/16@11am , pt to arrive there at 10:30am. Pt aware of appt.

## 2016-06-23 ENCOUNTER — Ambulatory Visit: Payer: 59

## 2016-06-24 ENCOUNTER — Ambulatory Visit
Admission: RE | Admit: 2016-06-24 | Discharge: 2016-06-24 | Disposition: A | Discharge status: Home / Self Care | Payer: 59 | Source: Ambulatory Visit | Attending: Internal Medicine | Admitting: Internal Medicine

## 2016-06-24 DIAGNOSIS — R188 Other ascites: Secondary | ICD-10-CM

## 2016-06-24 LAB — BODY FLUID CELL COUNT WITH DIFFERENTIAL
Eos, Fluid: 0 %
LYMPHS FL: 33 %
MONOCYTE-MACROPHAGE-SEROUS FLUID: 62 %
Neutrophil Count, Fluid: 5 %
WBC FLUID: 299 uL

## 2016-06-24 MED ORDER — ALBUMIN HUMAN 25 % IV SOLN
12.5000 g | Freq: Once | INTRAVENOUS | Status: DC
  Filled 2016-06-24: qty 50

## 2016-06-24 MED ORDER — ALBUMIN HUMAN 25 % IV SOLN
50.0000 g | Freq: Once | INTRAVENOUS | Status: AC
  Administered 2016-06-24 (×2): 25 g via INTRAVENOUS
  Filled 2016-06-24: qty 200

## 2016-06-24 NOTE — OR Nursing (Signed)
Verified with [facility] GI nurse that NP wants pt to receive 50 G albumin today. Informed that only 2.5 liters fluid removed.

## 2016-06-24 NOTE — Discharge Instructions (Signed)
Paracentesis, Care After °Refer to this sheet in the next few weeks. These instructions provide you with information about caring for yourself after your procedure. Your health care provider may also give you more specific instructions. Your treatment has been planned according to current medical practices, but problems sometimes occur. Call your health care provider if you have any problems or questions after your procedure. °WHAT TO EXPECT AFTER THE PROCEDURE °After your procedure, it is common to have a small amount of clear fluid coming from the puncture site. °HOME CARE INSTRUCTIONS °· Return to your normal activities as told by your health care provider. Ask your health care provider what activities are safe for you. °· Take over-the-counter and prescription medicines only as told by your health care provider. °· Do not take baths, swim, or use a hot tub until your health care provider approves. °· Follow instructions from your health care provider about: °¨ How to take care of your puncture site. °¨ When and how you should change your bandage (dressing). °¨ When you should remove your dressing. °· Check your puncture area every day signs of infection. Watch for: °¨ Redness, swelling, or pain. °¨ Fluid, blood, or pus. °· Keep all follow-up visits as told by your health care provider. This is important. °SEEK MEDICAL CARE IF: °· You have redness, swelling, or pain at your puncture site. °· You start to have more clear fluid coming from your puncture site. °· You have blood or pus coming from your puncture site. °· You have chills. °· You have a fever. °SEEK IMMEDIATE MEDICAL CARE IF: °· You develop chest pain or shortness of breath. °· You develop increasing pain, discomfort, or swelling in your abdomen. °· You feel dizzy or light-headed or you pass out. °  °This information is not intended to replace advice given to you by your health care provider. Make sure you discuss any questions you have with your health  care provider. °  °Document Released: 01/27/2015 Document Reviewed: 01/27/2015 °Elsevier Interactive Patient Education ©2016 Elsevier Inc. ° °

## 2016-06-25 LAB — PATHOLOGIST SMEAR REVIEW

## 2016-06-29 ENCOUNTER — Encounter: Payer: Self-pay | Admitting: *Deleted

## 2016-06-30 ENCOUNTER — Other Ambulatory Visit: Payer: Self-pay | Admitting: Internal Medicine

## 2016-07-05 ENCOUNTER — Telehealth: Payer: Self-pay | Admitting: Internal Medicine

## 2016-07-05 NOTE — Telephone Encounter (Signed)
Pt states that Sunday she started having pain on her right side under her ribs. States she also noticed that she is getting short of breath with exertion. Reports that when she sits down her breathing is better but when she starts moving around she is SOB again. States she has some fluid on her abdomen but not as much as she has had in the past. She has not weighed. Please advise.

## 2016-07-06 ENCOUNTER — Other Ambulatory Visit: Payer: Self-pay | Admitting: Internal Medicine

## 2016-07-06 NOTE — Telephone Encounter (Signed)
May be secondary to abdominal ascites Could also have a pleural effusion related to ascites. Two-view chest x-ray recommended Repeat CBC, CMP and INR She should follow-[facility] as scheduled and recommended To ER if dyspnea acutely worsens or for chest pain

## 2016-07-07 NOTE — Telephone Encounter (Signed)
Left message for pt to call back  °

## 2016-07-08 ENCOUNTER — Other Ambulatory Visit: Payer: Self-pay

## 2016-07-08 DIAGNOSIS — R0602 Shortness of breath: Secondary | ICD-10-CM

## 2016-07-08 DIAGNOSIS — K7031 Alcoholic cirrhosis of liver with ascites: Secondary | ICD-10-CM

## 2016-07-08 NOTE — Telephone Encounter (Signed)
Lab orders faxed to [facility] in [place]. Chest x[facility] ordered at [facility], [facility] for us para.

## 2016-07-08 NOTE — Telephone Encounter (Signed)
Pt aware and will go for chest xray and labs. Wants to know if she can have a paracentesis also? Please advise.

## 2016-07-08 NOTE — Telephone Encounter (Signed)
Left message for pt to call back  °

## 2016-07-08 NOTE — Telephone Encounter (Signed)
Okay for paracentesis with 50 g of IV albumin

## 2016-07-10 DIAGNOSIS — K7460 Unspecified cirrhosis of liver: Secondary | ICD-10-CM | POA: Insufficient documentation

## 2016-07-11 ENCOUNTER — Other Ambulatory Visit: Payer: Self-pay

## 2016-07-11 NOTE — Telephone Encounter (Signed)
Pt can have [facility] today. She can arrive at 1pm for a 1:30pm appt and also have chest xray. Left message for pt to call back.

## 2016-07-13 ENCOUNTER — Telehealth: Payer: Self-pay | Admitting: *Deleted

## 2016-07-13 ENCOUNTER — Ambulatory Visit (INDEPENDENT_AMBULATORY_CARE_PROVIDER_SITE_OTHER): Payer: 59 | Admitting: Family Medicine

## 2016-07-13 NOTE — Telephone Encounter (Signed)
Patient called and left voicemail to advise that she is currently admitted at [facility] and recently had fluid pulled off her lungs. Please see care everywhere for complete information on admission.

## 2016-07-13 NOTE — Telephone Encounter (Signed)
Pt is admitted at [facility]

## 2016-07-14 NOTE — Progress Notes (Signed)
Opened in error. Patient was not seen on this today.

## 2016-07-18 ENCOUNTER — Telehealth: Payer: Self-pay | Admitting: Internal Medicine

## 2016-07-18 NOTE — Telephone Encounter (Signed)
Pt calling to have a paracentesis done. Call placed to [facility] about scheduling para.

## 2016-07-18 NOTE — Telephone Encounter (Signed)
Patient is requesting to have paracentesis sooner than 10/30. She is requesting a callback. Best # [phone]

## 2016-07-19 NOTE — Telephone Encounter (Signed)
Paracentesis moved to Friday 07/22/16@1pm , pt to arrive at [facility] at 12:30pm. Tried to call pt but received message that voicemail is full and is not accepting calls at this time. Pt has appt in office tomorrow and will try to inform her of appt then.

## 2016-07-19 NOTE — Telephone Encounter (Signed)
Call placed to [facility] to see if paracentesis can be done sooner, waiting on a call back.

## 2016-07-20 ENCOUNTER — Ambulatory Visit (INDEPENDENT_AMBULATORY_CARE_PROVIDER_SITE_OTHER): Payer: 59 | Admitting: Internal Medicine

## 2016-07-20 ENCOUNTER — Encounter: Payer: Self-pay | Admitting: Internal Medicine

## 2016-07-20 VITALS — BP 108/48 | HR 62 | Ht 65.25 in | Wt 239.2 lb

## 2016-07-20 DIAGNOSIS — K766 Portal hypertension: Secondary | ICD-10-CM

## 2016-07-20 DIAGNOSIS — Z8619 Personal history of other infectious and parasitic diseases: Secondary | ICD-10-CM

## 2016-07-20 DIAGNOSIS — R600 Localized edema: Secondary | ICD-10-CM

## 2016-07-20 DIAGNOSIS — K7031 Alcoholic cirrhosis of liver with ascites: Secondary | ICD-10-CM

## 2016-07-20 MED ORDER — LACTULOSE 20 GM/30ML PO SOLN: 30.0000 mL | Freq: Every day | ORAL | 3 refills | Status: DC

## 2016-07-20 MED ORDER — PROMETHAZINE HCL 12.5 MG PO TABS: ORAL_TABLET | ORAL | 0 refills | Status: DC

## 2016-07-20 NOTE — Progress Notes (Signed)
Subjective:    Patient ID: Julie Graham, female    DOB: [birth year], 50 y.o.   MRN: [id number]  HPI Julie Graham is a 50 year old female with a past medical history of alcoholic cirrhosis, decompensated, complicated by portal hypertension with ascites, history of SBP, who is seen for follow-up. She is here today with her good friend Tim.  She was [facility]. She spent from from 07/10/2016 to 07/15/2016 hospitalized there. She reports IV diuresis, successful thoracentesis and unsuccessful paracentesis due to the lack of being able to find a suitable pocket of ascites. She had titration of diuretics and was discharged on Lasix 60 mg twice daily and Aldactone 200 mg daily. She has [facility] and also for liver transplantation. She is early in the process and they have told her that she needs to begin alcohol abstinence counseling as an initial step.    She reports that she continues to have increased abdominal girth and distention from what she feels to be fluid. She's having lower extremity edema though this has improved since initiation of diuretics during hospitalization. Her shortness of breath which was significant prior to thoracentesis has improved afterward. She denies shortness of breath today. She feels mildly tremulous and forgetful and also very fatigued. Her friend denies frank confusion. She is using MiraLAX daily to help her have bowel movements as she was having constipation. She denies fever and chills. She's had no bleeding including melena or blood in her stool. She has another paracentesis scheduled for later this week.  She continues on Cipro 500 mg daily for prophylaxis given history of SBP. She is taking omeprazole 40 mg daily, folate 1 mg daily.  EGD performed for follow-up of anastomotic ulceration performed on 06/14/2016. No varices. Friable mucosa at the anastomosis but previously seen ulceration had  healed.  Review of Systems As per HPI otherwise negative  Current Medications, Allergies, Past Medical History, Past Surgical History, Family History and Social History were reviewed in [facility] electronic medical record.     Objective:   Physical Exam BP (!) 108/48   Pulse 62   Ht 5' 5.25" (1.657 m)   Wt 239 lb 4 oz (108.5 kg)   LMP 02/25/2016   BMI 39.51 kg/m  Constitutional: Well-developed Though chronically ill-appearing  No distress. HEENT: Normocephalic and atraumatic. Oropharynx is clear and moist. No oropharyngeal exudate. Conjunctivae are normal.  Sclera mildly icteric Neck: Neck supple. Trachea midline. Cardiovascular: Normal rate, regular rhythm and intact distal pulses. No M/R/G Pulmonary/chest: Effort normal and breath sounds normal. No wheezing, rales or rhonchi. Abdominal: Soft, obese, nontender, mildly distended. Bowel sounds active throughout. Extremities: no clubbing, cyanosis, 2+ LE edema Neurological: Alert and oriented to person place and time. Fine tremor without asterixis Skin: Skin is warm and dry. No rashes noted. Psychiatric: Normal mood and affect. Behavior is normal.  CBC    Component Value Date/Time   WBC 7.0 06/01/2016 1556   WBC 9.9 04/27/2016 0504   RBC 2.90 (L) 06/01/2016 1556   RBC 3.13 (L) 04/27/2016 0504   HGB 10.6 (L) 04/27/2016 0504   HCT 25.4 (L) 06/01/2016 1556   PLT 139 (L) 06/01/2016 1556   MCV 88 06/01/2016 1556   MCH 31.7 06/01/2016 1556   MCH 33.9 04/27/2016 0504   MCHC 36.2 (H) 06/01/2016 1556   MCHC 35.6 04/27/2016 0504   RDW 13.5 06/01/2016 1556   LYMPHSABS 2.1 06/01/2016 1556   MONOABS 1.1 (H)  04/25/2016 2131   EOSABS 0.1 06/01/2016 1556   BASOSABS 0.1 06/01/2016 1556   CMP     Component Value Date/Time   NA 136 06/01/2016 1556   K 3.8 06/01/2016 1556   CL 100 06/01/2016 1556   CO2 22 06/01/2016 1556   GLUCOSE 100 (H) 06/01/2016 1556   GLUCOSE 90 04/26/2016 0506   BUN 5 (L) 06/01/2016 1556   CREATININE  0.74 06/01/2016 1556   CALCIUM 8.5 (L) 06/01/2016 1556   PROT 7.4 06/01/2016 1556   ALBUMIN 2.7 (L) 06/01/2016 1556   AST 74 (H) 06/01/2016 1556   ALT 24 06/01/2016 1556   ALKPHOS 98 06/01/2016 1556   BILITOT 4.2 (H) 06/01/2016 1556   GFRNONAA 96 06/01/2016 1556   GFRAA 111 06/01/2016 1556   Lab Results  Component Value Date   INR 2.4 (H) 06/01/2016   INR 2.7 05/24/2016   INR 1.86 04/26/2016       Assessment & Plan:  50 year old female with a past medical history of alcoholic cirrhosis, decompensated, complicated by portal hypertension with ascites, history of SBP, who is seen for follow-up.  1. Decompensated alcoholic cirrhosis with ascites, lower extremity edema -- we spent a great ill time today discussing her overall disease process including portal hypertension and decompensation. I recommended strongly that she follow-up very closely with you and see hepatology and go through the transplant evaluation process. Given her trajectory over the course of this year I expect she will need transplant to have any meaningful life expectancy. I encouraged her strongly to begin alcohol abstinence counseling. She has been alcohol abstinent and in my opinion has a relatively low relapse risk. Despite this, I strongly encouraged abstinence counseling given that this is required for transplantation and also proven to prevent relapse. --Continue Lasix 60 mg twice daily and Aldactone 200 mg daily. Closely follow renal function --Discontinue MiraLAX, begin lactulose 30 mL once daily. Titrate to twice daily and further if needed to achieve 2-3 soft formed stools daily. --Variceal screening up-to-date with no varices as of last month --Low sodium diet --Close follow-up with you and see hepatology --Continue Cipro daily for SBP prophylaxis --Paracentesis as needed with IV albumin  2. History of anastomotic gastro-jejunal ulcer -- healed at recent EGD. Continue omeprazole 40 mg daily. She is advised to  break the capsule opened and sprinkle on applesauce for administration given history of gastric bypass  I do think that [place] is without question medically disabled. She needs attestation for her employer which I will try to help her with. She is asked that are each out to [employer] with [employer]. Phone number [phone] extension [phone]. Fax number [phone]   45 minutes spent with the patient today. Greater than 50% was spent in counseling and coordination of care with the patient

## 2016-07-20 NOTE — Patient Instructions (Addendum)
Your Paracentesis has been scheduled on Friday 07/22/2016 at 1 pm at [facility]  At 1:00pm  you will need to arrive at  12:30pm    Go to the basement today for labs  Make sure you follow [facility] with [facility]   Low-Sodium Eating Plan Sodium raises blood pressure and causes water to be held in the body. Getting less sodium from food will help lower your blood pressure, reduce any swelling, and protect your heart, liver, and kidneys. We get sodium by adding salt (sodium chloride) to food. Most of our sodium comes from canned, boxed, and frozen foods. Restaurant foods, fast foods, and pizza are also very high in sodium. Even if you take medicine to lower your blood pressure or to reduce fluid in your body, getting less sodium from your food is important. WHAT IS MY PLAN? Most people should limit their sodium intake to 2,300 mg a day. Your health care provider recommends that you limit your sodium intake to __________ a day.  WHAT DO I NEED TO KNOW ABOUT THIS EATING PLAN? For the low-sodium eating plan, you will follow these general guidelines:  Choose foods with a % Daily Value for sodium of less than 5% (as listed on the food label).   Use salt-free seasonings or herbs instead of table salt or sea salt.   Check with your health care provider or pharmacist before using salt substitutes.   Eat fresh foods.  Eat more vegetables and fruits.  Limit canned vegetables. If you do use them, rinse them well to decrease the sodium.   Limit cheese to 1 oz (28 g) per day.   Eat lower-sodium products, often labeled as "lower sodium" or "no salt added."  Avoid foods that contain monosodium glutamate (MSG). MSG is sometimes added to [place] food and some canned foods.  Check food labels (Nutrition Facts labels) on foods to learn how much sodium is in one serving.  Eat more home-cooked food and less restaurant, buffet, and fast food.  When eating at a restaurant, ask that  your food be prepared with less salt, or no salt if possible.  HOW DO I READ FOOD LABELS FOR SODIUM INFORMATION? The Nutrition Facts label lists the amount of sodium in one serving of the food. If you eat more than one serving, you must multiply the listed amount of sodium by the number of servings. Food labels may also identify foods as:  Sodium free--Less than 5 mg in a serving.  Very low sodium--35 mg or less in a serving.  Low sodium--140 mg or less in a serving.  Light in sodium--50% less sodium in a serving. For example, if a food that usually has 300 mg of sodium is changed to become light in sodium, it will have 150 mg of sodium.  Reduced sodium--25% less sodium in a serving. For example, if a food that usually has 400 mg of sodium is changed to reduced sodium, it will have 300 mg of sodium. WHAT FOODS CAN I EAT? Grains Low-sodium cereals, including oats, puffed wheat and rice, and shredded wheat cereals. Low-sodium crackers. Unsalted rice and pasta. Lower-sodium bread.  Vegetables Frozen or fresh vegetables. Low-sodium or reduced-sodium canned vegetables. Low-sodium or reduced-sodium tomato sauce and paste. Low-sodium or reduced-sodium tomato and vegetable juices.  Fruits Fresh, frozen, and canned fruit. Fruit juice.  Meat and Other Protein Products Low-sodium canned tuna and salmon. Fresh or frozen meat, poultry, seafood, and fish. Lamb. Unsalted nuts. Dried beans, peas, and lentils  without added salt. Unsalted canned beans. Homemade soups without salt. Eggs.  Dairy Milk. Soy milk. Ricotta cheese. Low-sodium or reduced-sodium cheeses. Yogurt.  Condiments Fresh and dried herbs and spices. Salt-free seasonings. Onion and garlic powders. Low-sodium varieties of mustard and ketchup. Fresh or refrigerated horseradish. Lemon juice.  Fats and Oils Reduced-sodium salad dressings. Unsalted butter.  Other Unsalted popcorn and pretzels.  The items listed above may not be a  complete list of recommended foods or beverages. Contact your dietitian for more options. WHAT FOODS ARE NOT RECOMMENDED? Grains Instant hot cereals. Bread stuffing, pancake, and biscuit mixes. Croutons. Seasoned rice or pasta mixes. Noodle soup cups. Boxed or frozen macaroni and cheese. Self-rising flour. Regular salted crackers. Vegetables Regular canned vegetables. Regular canned tomato sauce and paste. Regular tomato and vegetable juices. Frozen vegetables in sauces. Salted [place] fries. Olives. Rosita Fire. Relishes. Sauerkraut. Salsa. Meat and Other Protein Products Salted, canned, smoked, spiced, or pickled meats, seafood, or fish. Bacon, ham, sausage, hot dogs, corned beef, chipped beef, and packaged luncheon meats. Salt pork. Jerky. Pickled herring. Anchovies, regular canned tuna, and sardines. Salted nuts. Dairy Processed cheese and cheese spreads. Cheese curds. Blue cheese and cottage cheese. Buttermilk.  Condiments Onion and garlic salt, seasoned salt, table salt, and sea salt. Canned and packaged gravies. Worcestershire sauce. Tartar sauce. Barbecue sauce. Teriyaki sauce. Soy sauce, including reduced sodium. Steak sauce. Fish sauce. Oyster sauce. Cocktail sauce. Horseradish that you find on the shelf. Regular ketchup and mustard. Meat flavorings and tenderizers. Bouillon cubes. Hot sauce. Tabasco sauce. Marinades. Taco seasonings. Relishes. Fats and Oils Regular salad dressings. Salted butter. Margarine. Ghee. Bacon fat.  Other Potato and tortilla chips. Corn chips and puffs. Salted popcorn and pretzels. Canned or dried soups. Pizza. Frozen entrees and pot pies.  The items listed above may not be a complete list of foods and beverages to avoid. Contact your dietitian for more information.   This information is not intended to replace advice given to you by your health care provider. Make sure you discuss any questions you have with your health care provider.   Document Released:  03/04/2002 Document Revised: 10/03/2014 Document Reviewed: 07/17/2013 Elsevier Interactive Patient Education 2016 Elsevier Inc.   Take lasix 60 mg daily Take Aldactone 200 mg daily Use Cipro 500mg  every day  Use Folate 1 mg every day Omeprazole is 40 mg daily Lactulose is 30 ml daily and increase if not at least 2  bowel movements daily

## 2016-07-20 NOTE — Discharge Instructions (Signed)
Paracentesis, Care After °Refer to this sheet in the next few weeks. These instructions provide you with information about caring for yourself after your procedure. Your health care provider may also give you more specific instructions. Your treatment has been planned according to current medical practices, but problems sometimes occur. Call your health care provider if you have any problems or questions after your procedure. °WHAT TO EXPECT AFTER THE PROCEDURE °After your procedure, it is common to have a small amount of clear fluid coming from the puncture site. °HOME CARE INSTRUCTIONS °· Return to your normal activities as told by your health care provider. Ask your health care provider what activities are safe for you. °· Take over-the-counter and prescription medicines only as told by your health care provider. °· Do not take baths, swim, or use a hot tub until your health care provider approves. °· Follow instructions from your health care provider about: °¨ How to take care of your puncture site. °¨ When and how you should change your bandage (dressing). °¨ When you should remove your dressing. °· Check your puncture area every day signs of infection. Watch for: °¨ Redness, swelling, or pain. °¨ Fluid, blood, or pus. °· Keep all follow-up visits as told by your health care provider. This is important. °SEEK MEDICAL CARE IF: °· You have redness, swelling, or pain at your puncture site. °· You start to have more clear fluid coming from your puncture site. °· You have blood or pus coming from your puncture site. °· You have chills. °· You have a fever. °SEEK IMMEDIATE MEDICAL CARE IF: °· You develop chest pain or shortness of breath. °· You develop increasing pain, discomfort, or swelling in your abdomen. °· You feel dizzy or light-headed or you pass out. °  °This information is not intended to replace advice given to you by your health care provider. Make sure you discuss any questions you have with your health  care provider. °  °Document Released: 01/27/2015 Document Reviewed: 01/27/2015 °Elsevier Interactive Patient Education ©2016 Elsevier Inc. ° °

## 2016-07-21 ENCOUNTER — Telehealth: Payer: Self-pay | Admitting: Internal Medicine

## 2016-07-21 NOTE — Telephone Encounter (Signed)
I contacted [employer] and spoke to [facility] I made them aware that in my opinion she is completely disabled with no anticipated date to return to work at this point. I can confidently project ongoing disability for at least 6 months [employer] will fax me documentation for my signature which I will await Tobi Bastosnna has request extension of disability through 09/25/2016 which I made [facility] where I think is very reasonable. She notated her account

## 2016-07-21 NOTE — Telephone Encounter (Signed)
Julie Graham asked me yesterday to contact him with the [employer] to discuss her disability I left a message with Julie Graham at phone number [phone] extension [phone] I asked that she contact me at [phone]

## 2016-07-21 NOTE — Telephone Encounter (Signed)
error 

## 2016-07-22 ENCOUNTER — Ambulatory Visit
Admission: RE | Admit: 2016-07-22 | Discharge: 2016-07-22 | Disposition: A | Discharge status: Home / Self Care | Payer: 59 | Source: Ambulatory Visit | Attending: Internal Medicine | Admitting: Internal Medicine

## 2016-07-22 ENCOUNTER — Telehealth: Payer: Self-pay | Admitting: *Deleted

## 2016-07-22 ENCOUNTER — Encounter: Payer: Self-pay | Admitting: Family Medicine

## 2016-07-22 ENCOUNTER — Ambulatory Visit (INDEPENDENT_AMBULATORY_CARE_PROVIDER_SITE_OTHER): Payer: 59 | Admitting: Family Medicine

## 2016-07-22 ENCOUNTER — Ambulatory Visit: Payer: 59 | Admitting: Internal Medicine

## 2016-07-22 DIAGNOSIS — J90 Pleural effusion, not elsewhere classified: Secondary | ICD-10-CM | POA: Insufficient documentation

## 2016-07-22 DIAGNOSIS — K7031 Alcoholic cirrhosis of liver with ascites: Secondary | ICD-10-CM | POA: Insufficient documentation

## 2016-07-22 DIAGNOSIS — R600 Localized edema: Secondary | ICD-10-CM

## 2016-07-22 DIAGNOSIS — R0602 Shortness of breath: Secondary | ICD-10-CM

## 2016-07-22 DIAGNOSIS — F418 Other specified anxiety disorders: Secondary | ICD-10-CM | POA: Diagnosis not present

## 2016-07-22 DIAGNOSIS — F419 Anxiety disorder, unspecified: Secondary | ICD-10-CM

## 2016-07-22 DIAGNOSIS — F329 Major depressive disorder, single episode, unspecified: Secondary | ICD-10-CM

## 2016-07-22 LAB — BODY FLUID CELL COUNT WITH DIFFERENTIAL
EOS FL: 0 %
Lymphs, Fluid: 55 %
MONOCYTE-MACROPHAGE-SEROUS FLUID: 43 %
NEUTROPHIL FLUID: 2 %
Other Cells, Fluid: 0 %
WBC FLUID: 172 uL

## 2016-07-22 MED ORDER — ALBUMIN HUMAN 25 % IV SOLN
25.0000 g | Freq: Once | INTRAVENOUS | Status: AC
  Administered 2016-07-22: 16 g via INTRAVENOUS
  Filled 2016-07-22: qty 100

## 2016-07-22 MED ORDER — ALBUMIN HUMAN 25 % IV SOLN: 50.0000 g | Freq: Once | INTRAVENOUS | Status: DC

## 2016-07-22 NOTE — Patient Instructions (Addendum)
Nice to see you. Please go for your paracentesis. You should be taking Lasix 60 mg twice daily. You should be taking Aldactone 200 mg daily. You should be taking ciprofloxacin as well. You should also start the lactulose as prescribed. Please monitor your anxiety and depression. You need to check with the transplant program to see how often you are going to see their therapist. If you develop abdominal pain, fevers, confusion, thoughts of harming your self, or any new or change in symptoms please seek medical attention immediately.

## 2016-07-22 NOTE — Assessment & Plan Note (Signed)
Patient with continued issues with anxiety and depression given her current situation. Was previously on Celexa and did have some benefit from this. Had to come off of this related to being on ciprofloxacin. Given recent INR elevation greater than 2 I am hesitant to place her on any SSRI given hepatic dysfunction. I discussed this with the patient. Advised that she see a therapist. She will see if the transplant clinic therapist can help her with this. If not she will let us know and we can have her see a counselor here in [place]. Given return precautions.

## 2016-07-22 NOTE — Assessment & Plan Note (Signed)
Recently hospitalized for hepatic hydrothorax. Breathing is improved. Mild decreased breath sounds on the right today. Oxygen in the normal range. Distention on abdominal exam though no overt tenderness. She had lab work recently and we will attempt to request these labs. She is due to have a paracentesis today. She will leave here and go do that. I advised on appropriate Lasix and Aldactone dosing. She will also start on the lactulose as prescribed by her GI physician. She'll continue ciprofloxacin as prescribed by GI. She will follow low salt diet. She is given return precautions.

## 2016-07-22 NOTE — Progress Notes (Signed)
Patient given 16 g albumin. Tolerated well, discharged to home.

## 2016-07-22 NOTE — Procedures (Signed)
US guided paracentesis performed without complication.  Removed 1.9 liters.  See full report in PACS.

## 2016-07-22 NOTE — Assessment & Plan Note (Signed)
Continues to have issues with edema. Is not taking the appropriate dose of Lasix. Possibly not taking the appropriate dose of spironolactone. I updated her on these doses. She does have hemosiderin changes in her bilateral lower extremities. Reassured her that these are not infectious lesions. She'll continue Lasix and spironolactone. Advised on low-salt diet.

## 2016-07-22 NOTE — Telephone Encounter (Signed)
Per fax request from pharmacy pt needed a 90 day supply of Lactulose sent in to mail order  Called into pharmacy today

## 2016-07-22 NOTE — Progress Notes (Signed)
Pre visit review using our clinic review tool, if applicable. No additional management support is needed unless otherwise documented below in the visit note. 

## 2016-07-22 NOTE — Progress Notes (Signed)
Julie AlarEric Taeveon Keesling, MD Phone: [phone]  Julie Graham is a 50 y.o. female who presents today for hospital follow-up.  Patient [facility] at [facility]. She underwent successful thoracentesis and unsuccessful paracentesis while there. She had IV diuresis. She was discharged on Lasix 60 mg twice daily and Aldactone 200 mg daily. She's been evaluated for liver [facility]. She is early in the process of considering liver transplant and will continue to meet with them. She reports she has only been taking 40 mg of Lasix twice daily. She is unsure of the dose of Aldactone. She ate some salty food yesterday and has put on 5 pounds since she last saw GI. She has not done a great job following a low-salt diet. She notes no breathing issues today. She does note abdominal distention and mild discomfort with the distention though no pain. She notes some soreness at the sites of attempted paracentesis though no other abdominal discomfort. She notes the soreness is chronic and has not worsened. No fevers. She has been started on lactulose though has not started this. She is going to have a paracentesis today. She reports having had lab work 2 days ago through the GI office. She wonders if the skin changes in her lower legs are related to an infection though has been told these are related to the swelling in her legs. She notes significant anxiety and depression with all these life changes. She notes she did well on Celexa previously though had to come off of this with the ciprofloxacin. She has not seen a therapist. No SI.Marland Kitchen.  PMH: Nonsmoker. Former alcoholic.   ROS see history of present illness  Objective  Physical Exam Vitals:   07/22/16 1139  BP: 120/64  Pulse: 66  Temp: 98.1 F (36.7 C)    BP Readings from Last 3 Encounters:  07/22/16 120/64  07/20/16 (!) 108/48  06/24/16 120/69   Wt Readings from Last 3 Encounters:  07/22/16 244 lb 3.2 oz  (110.8 kg)  07/20/16 239 lb 4 oz (108.5 kg)  06/14/16 232 lb (105.2 kg)    Physical Exam  Constitutional: No distress.  HENT:  Head: Normocephalic and atraumatic.  Eyes: Scleral icterus (mild) is present.  Cardiovascular: Normal rate, regular rhythm and normal heart sounds.   Pulmonary/Chest: Effort normal. No respiratory distress. She has no wheezes. She has no rales.  Mild decreased breath sounds on the right  Abdominal: Soft. Bowel sounds are normal. She exhibits distension. There is no tenderness. There is no rebound and no guarding.  Mild soreness over her prior paracentesis site with no skin changes  Musculoskeletal: She exhibits edema (bilateral lower extremities with hemosiderin deposition changes).  Neurological: She is alert. Gait normal.  Skin: Skin is warm and dry. She is not diaphoretic.     Assessment/Plan: Please see individual problem list.  Alcoholic cirrhosis (HCC) Recently hospitalized for hepatic hydrothorax. Breathing is improved. Mild decreased breath sounds on the right today. Oxygen in the normal range. Distention on abdominal exam though no overt tenderness. She had lab work recently and we will attempt to request these labs. She is due to have a paracentesis today. She will leave here and go do that. I advised on appropriate Lasix and Aldactone dosing. She will also start on the lactulose as prescribed by her GI physician. She'll continue ciprofloxacin as prescribed by GI. She will follow low salt diet. She is given return precautions.  Anxiety and depression Patient with continued  issues with anxiety and depression given her current situation. Was previously on Celexa and did have some benefit from this. Had to come off of this related to being on ciprofloxacin. Given recent INR elevation greater than 2 I am hesitant to place her on any SSRI given hepatic dysfunction. I discussed this with the patient. Advised that she see a therapist. She will see if the  transplant clinic therapist can help her with this. If not she will let us know and we can have her see a counselor here in [place]. Given return precautions.  Bilateral lower extremity edema Continues to have issues with edema. Is not taking the appropriate dose of Lasix. Possibly not taking the appropriate dose of spironolactone. I updated her on these doses. She does have hemosiderin changes in her bilateral lower extremities. Reassured her that these are not infectious lesions. She'll continue Lasix and spironolactone. Advised on low-salt diet.   Julie Alar, MD [facility]

## 2016-07-25 ENCOUNTER — Ambulatory Visit: Admission: RE | Admit: 2016-07-25 | Payer: 59 | Source: Ambulatory Visit

## 2016-07-27 ENCOUNTER — Other Ambulatory Visit: Payer: Self-pay | Admitting: Internal Medicine

## 2016-07-27 ENCOUNTER — Telehealth: Payer: Self-pay | Admitting: Internal Medicine

## 2016-07-27 ENCOUNTER — Other Ambulatory Visit: Payer: Self-pay | Admitting: *Deleted

## 2016-07-27 LAB — PATHOLOGIST SMEAR REVIEW

## 2016-07-27 MED ORDER — FOLIC ACID 1 MG PO TABS: 1.0000 mg | ORAL_TABLET | Freq: Every day | ORAL | 0 refills | Status: DC

## 2016-07-27 MED ORDER — FUROSEMIDE 20 MG PO TABS: 60.0000 mg | ORAL_TABLET | Freq: Two times a day (BID) | ORAL | 0 refills | Status: DC

## 2016-07-27 MED ORDER — FUROSEMIDE 20 MG PO TABS: 60.0000 mg | ORAL_TABLET | Freq: Two times a day (BID) | ORAL | 0 refills | Status: AC

## 2016-07-27 NOTE — Telephone Encounter (Signed)
Rx sent 

## 2016-07-28 ENCOUNTER — Telehealth: Payer: Self-pay | Admitting: Internal Medicine

## 2016-07-29 NOTE — Telephone Encounter (Signed)
Patient calling again regarding this.

## 2016-07-29 NOTE — Telephone Encounter (Signed)
Left a message to call back.

## 2016-08-01 NOTE — Telephone Encounter (Signed)
She very well may need a thoracentesis but I would like to discuss this with her liver physicians at [facility]. For her recurrent hepatic hydrothorax, she may need to consider TIPS in the long run. Given that she has established with [facility] hepat[facility], TIPS would be best done there (and perhaps her repeat thoracentesis, given she had one performed there during hospitalization recently) Please get me a contact for her pre-transplant coordinator or hepatology provider at [facility] and then we can make decision re: thoracentesis

## 2016-08-01 NOTE — Telephone Encounter (Signed)
Patient calls because she is having "problems with breathing again" and "I need them to take that fluid off again". Diuretic doses confirmed. Wants this done in [place]. Please advise on this and any orders.

## 2016-08-02 NOTE — Telephone Encounter (Signed)
Pt calling back. States she is still having some SOB but states it is not like it was when she had to have a thoracentesis done. Pt is requesting a paracentesis. Please advise.

## 2016-08-03 NOTE — Telephone Encounter (Signed)
I spoke to Julie Graham nurse practitioner at [facility] hepatology She is aware of Waymon Amatonna Woollard and we reviewed her current situation including my assessment and plan from 07/20/2016 [facility] will contact the patient to arrange thoracentesis and paracentesis through likely interventional radiology Currently they do not think TIPS is an option due to decompensation and synthetic dysfunction with bilirubin near 5 Please let Tobi Bastosnna know she will hear from [facility] regarding or centesis and paracentesis, continue low-sodium diet and follow-[facility] as scheduled later this month

## 2016-08-03 NOTE — Telephone Encounter (Signed)
Spoke with pt and let her know [facility] will be in contact with her. She states the breathing seems to be getting worse. Discussed with pt that she could always go to the ER at [facility] to expedite things. Pt states she may do that.

## 2016-08-03 NOTE — Telephone Encounter (Signed)
Also for records, Patsi SearsVicki Metheny's pager number is [phone]

## 2016-08-07 DIAGNOSIS — R601 Generalized edema: Secondary | ICD-10-CM | POA: Insufficient documentation

## 2016-08-10 ENCOUNTER — Telehealth: Payer: Self-pay | Admitting: *Deleted

## 2016-08-10 NOTE — Telephone Encounter (Signed)
Attempted to complete TCM call.  Left a message to return my call to complete.  Was admitted for SOB and [facility] overload to [facility].

## 2016-08-10 NOTE — Telephone Encounter (Signed)
Pt will D/c from [facility] on 11/15. She was admitted for SOB and increase fluid retention.  Pt has been scheduled

## 2016-08-11 ENCOUNTER — Ambulatory Visit: Payer: 59 | Admitting: Nurse Practitioner

## 2016-08-11 ENCOUNTER — Telehealth: Payer: Self-pay | Admitting: Family Medicine

## 2016-08-11 NOTE — Telephone Encounter (Signed)
Pt was scheduled for a hospital follow up on 11/21 but unable to make it due to a schedule conflict. Please advise, as to where to reschedule. Thank you!  Call pt @ [phone]

## 2016-08-11 NOTE — Telephone Encounter (Signed)
You can use any 11:30 spot

## 2016-08-12 ENCOUNTER — Ambulatory Visit: Payer: 59 | Admitting: Family Medicine

## 2016-08-12 NOTE — Telephone Encounter (Signed)
Transition Care Management Follow-up Telephone Call  How have you been since you were released from the hospital? No SOB anymore, weak still.     Do you understand why you were in the hospital? yes no questions.    Do you understand the discharge instrcutions? Yes no questions.  Doctors there would like PCP to manage Lasix follow up so that patient doesn't have to return to [facility] each time for issues  Items Reviewed:  Medications reviewed: vitamin K in the hospital not as a prescription.  Lasix 60 mg twice day, Spirolactone twice a day  Allergies reviewed: yes. No changes  Dietary changes reviewed: no changes  Referrals reviewed: Current doctors, outpatient drug and abuse for 6 months of therapy, twice a month for ability to get on the transplant list.     Functional Questionnaire:   Activities of Daily Living (ADLs):   She states they are independent in the following: all ADL's, no issues States they require assistance with the following:    Any transportation issues/concerns?: no issues   Any patient concerns? No questions, she is aware that that she needs to follow up.    Confirmed importance and date/time of follow-up visits schedule: Yes appt scheduled.    Confirmed with patient if condition begins to worsen call PCP or go to the ER.  Patient was given the Call-a-Nurse line [phone]: yes

## 2016-08-16 ENCOUNTER — Ambulatory Visit: Payer: 59 | Admitting: Family Medicine

## 2016-08-23 ENCOUNTER — Ambulatory Visit (INDEPENDENT_AMBULATORY_CARE_PROVIDER_SITE_OTHER): Payer: 59 | Admitting: Family Medicine

## 2016-08-23 ENCOUNTER — Encounter: Payer: Self-pay | Admitting: Family Medicine

## 2016-08-23 DIAGNOSIS — R21 Rash and other nonspecific skin eruption: Secondary | ICD-10-CM | POA: Insufficient documentation

## 2016-08-23 DIAGNOSIS — J948 Other specified pleural conditions: Secondary | ICD-10-CM | POA: Insufficient documentation

## 2016-08-23 DIAGNOSIS — J90 Pleural effusion, not elsewhere classified: Secondary | ICD-10-CM

## 2016-08-23 HISTORY — DX: Other specified pleural conditions: J94.8

## 2016-08-23 MED ORDER — TRIAMCINOLONE ACETONIDE 0.1 % EX CREA: 1.0000 "application " | TOPICAL_CREAM | Freq: Two times a day (BID) | CUTANEOUS | 0 refills | Status: DC

## 2016-08-23 NOTE — Progress Notes (Signed)
Pre visit review using our clinic review tool, if applicable. No additional management support is needed unless otherwise documented below in the visit note. 

## 2016-08-23 NOTE — Assessment & Plan Note (Addendum)
Patient recently hospitalized 2 for recurrent pleural effusions. Had a thoracentesis about a week ago and they removed 2 L of fluid. Seen in the emergency room the next day and noted to have good air movement with minimal pleural effusion on chest x-ray. It appears that the patient's pleural effusion has recurred. She noted some mild shortness of breath starting last night. She notes mild shortness of breath at times today. She is in no acute distress. Vital signs are stable. She does have some air movement in the lower two thirds of her right lung field though it is decreased. given patient's symptoms and recent recurrent pleural effusions she will go to the emergency [facility] for further [facility] as she has been hospitalized there recently. She has somebody to transport her and I feel this is reasonable for them to transport her given that she is in no acute distress and has stable vital signs. Advised that if anything changes in route she should stop and call 911.

## 2016-08-23 NOTE — Progress Notes (Signed)
Marikay AlarEric Amarie Viles, MD Phone: [phone]  Julie Graham is a 50 y.o. female who presents today for follow-up.  Patient seen today for hospital follow-up. She was [facility] to [facility] with pleural effusion and ascites associated with shortness of breath. She had thoracentesis while hospitalized. She was hospitalized for 6 days. It appears that she had not been compliant with her Lasix and Aldactone regimen or her diet. Since discharge she has had an additional thoracentesis. She noted she had been doing well until last night when she developed some shortness of breath with getting ready for bed. She notes mild shortness of breath intermittently throughout the day today. She notes no chest pain. She also notes what sounds to be occasional asterixis. She has been compliant with Lasix 60 mg twice daily and Aldactone 200 mg. She is working on salt intake and decreasing this. Notes a rash on her lower extremities with some erythema. She was prescribed Benadryl and hydrocortisone cream for those. States they thought it was an atopic dermatitis.   PMH: nonsmoker.   ROS see history of present illness  Objective  Physical Exam Vitals:   08/23/16 1134  BP: 120/68  Pulse: 97  Temp: 98 F (36.7 C)    BP Readings from Last 3 Encounters:  08/23/16 120/68  07/22/16 (!) 117/56  07/22/16 120/64   Wt Readings from Last 3 Encounters:  08/23/16 241 lb 9.6 oz (109.6 kg)  07/22/16 244 lb 3.2 oz (110.8 kg)  07/20/16 239 lb 4 oz (108.5 kg)    Physical Exam  Constitutional: No distress.  Neck: No tracheal deviation present.  Cardiovascular: Normal rate, regular rhythm and normal heart sounds.   Pulmonary/Chest: Effort normal. No respiratory distress. She has no wheezes. She has no rales.  Patient with decreased air movement in right lower lung field lower two thirds, normal lung sounds elsewhere  Abdominal: Soft. Bowel sounds are normal. She exhibits distension. There is no tenderness. There is no  rebound and no guarding.  Musculoskeletal: She exhibits edema (1+ pitting).  Skin: Skin is warm and dry. She is not diaphoretic.  Erythematous patches of rash noted on bilateral lower extremities, rash is blanching, nontender, no induration or warmth     Assessment/Plan: Please see individual problem list.  Pleural effusion Patient recently hospitalized 2 for recurrent pleural effusions. Had a thoracentesis about a week ago and they removed 2 L of fluid. Seen in the emergency room the next day and noted to have good air movement with minimal pleural effusion on chest x-ray. It appears that the patient's pleural effusion has recurred. She noted some mild shortness of breath starting last night. She notes mild shortness of breath at times today. She is in no acute distress. Vital signs are stable. She does have some air movement in the lower two thirds of her right lung field though it is decreased. given patient's symptoms and recent recurrent pleural effusions she will go to the emergency [facility] for further evaluation as she has been hospitalized there recently. She has somebody to transport her and I feel this is reasonable for them to transport her given that she is in no acute distress and has stable vital signs. Advised that if anything changes in route she should stop and call 911.   Rash and nonspecific skin eruption Patient with rash of uncertain cause on her bilateral lower extremities. Suspect could be related to stasis dermatitis worsens or edema given that she does have a fair amount of swelling  in her legs. Discussed using a higher strength topical steroid to see if that will be beneficial. This was sent to her pharmacy. She will discontinue the hydrocortisone.   No orders of the defined types were placed in this encounter.   Meds ordered this encounter  Medications  . triamcinolone cream (KENALOG) 0.1 %    Sig: Apply 1 application topically 2 (two) times daily.    Dispense:   30 g    Refill:  0    Marikay AlarEric Dail Lerew, MD [facility]

## 2016-08-23 NOTE — Patient Instructions (Signed)
Nice to see you. Given your mild trouble breathing and potential for fluid buildup in your lung you need to be evaluated at the emergency room. Please go there immediately. We will try triamcinolone for the rash on your legs. You should stop the hydrocortisone. If you develop worsening symptoms in route to the emergency room please call 911.

## 2016-08-23 NOTE — Assessment & Plan Note (Signed)
Patient with rash of uncertain cause on her bilateral lower extremities. Suspect could be related to stasis dermatitis worsens or edema given that she does have a fair amount of swelling in her legs. Discussed using a higher strength topical steroid to see if that will be beneficial. This was sent to her pharmacy. She will discontinue the hydrocortisone.

## 2016-08-29 ENCOUNTER — Telehealth: Payer: Self-pay | Admitting: Family Medicine

## 2016-08-29 NOTE — Telephone Encounter (Signed)
Please advise 

## 2016-08-29 NOTE — Telephone Encounter (Signed)
Pt called and asked if you could call in her promethazine (PHENERGAN) 12.5 MG tablet. Thank you!  Pharmacy - [facility] [phone] - [place], [place] - [address]  Call pt @ [phone]

## 2016-08-30 NOTE — Telephone Encounter (Signed)
Please let the patient know that there is a warning/precaution against using this in patients with liver issues. We could try zofran if she is not already taking this. Thanks.

## 2016-08-31 ENCOUNTER — Other Ambulatory Visit: Payer: Self-pay | Admitting: Internal Medicine

## 2016-08-31 MED ORDER — ONDANSETRON HCL 4 MG PO TABS: 4.0000 mg | ORAL_TABLET | Freq: Two times a day (BID) | ORAL | 0 refills | Status: DC | PRN

## 2016-08-31 NOTE — Telephone Encounter (Signed)
Dr Rhea BeltonPyrtle- please see care everywhere about recent activity at [facility]. Patient requests refills on cipro 500 mg once daily and furosemide 40 mg, 3 tablets twice daily. Does she need to stay on same amount of medication?

## 2016-08-31 NOTE — Telephone Encounter (Signed)
Def needs cipro Lasix dose should continue, but would defer [facility] as they should be following closely.

## 2016-08-31 NOTE — Telephone Encounter (Signed)
Spoke with patient and she is fine with taking the Zofran. Please send to pharmacy

## 2016-08-31 NOTE — Telephone Encounter (Signed)
Sent to pharmacy 

## 2016-08-31 NOTE — Telephone Encounter (Signed)
Patient called inquiring on her prescription for nausea . I informed the patient per the Dr's CMA that he is still seeing patients and that he will get to it a soon as he is finished seeing patients.

## 2016-09-05 ENCOUNTER — Telehealth: Payer: Self-pay | Admitting: Family Medicine

## 2016-09-05 NOTE — Telephone Encounter (Signed)
Pt called and stated that she was in the ED on Saturday and she had an ulcer on her right leg and it had burst. She needs an appt to follow up. Pt says that it is painful and that it is still oozing. Pt states that the pain meds are not helping. She needs an appt and it needs to be in the afternoon. Please advise, thank you!  Call pt @ [phone]

## 2016-09-05 NOTE — Telephone Encounter (Signed)
Spoke with patient and she went to [facility] on Saturday. She sated that she had an ulcer that burst on her right leg. They put her on and antibiotic and Oxycodone for this. She said that her leg is still draining and has a lot of pain. She said that the ED was concerned for a blood clot, but did not do an US. I advised her that with her pain and concern for a blood clot she should be seen today preferably at the ED so she can have an US done. She stated that she is tired of going to the ED and would rather see Dr. Birdie SonsSonnenberg tomorrow. I have scheduled her to come in at 11:15. She will call if she ends up going to the ED

## 2016-09-05 NOTE — Telephone Encounter (Signed)
Contacted patient and discussed prior phone message. Reviewed note from ED in [place]. It appears that they felt as though her swelling and pain was most likely related to her chronic swelling related to her cirrhosis. She did have an elevated d-dimer though it appears they did not think it was likely related to DVT and they could not do a lower extremity ultrasound. They recommended that she follow-up with her PCP or go to the emergency [facility] to have the ultrasound done. She reports that the pain is better controlled by tramadol than oxycodone. She has been using Lasix and this has been helping with the fluid accumulation. Her leg has been oozing some serosanguineous material. No odor. Does not feel warm anymore. She notes no chest pain or shortness of breath. She has no unilateral leg swelling. I discussed given the elevated d-dimer that it would be important for her to be evaluated for this today to have an ultrasound. She reports she does not have any transportation and cannot get to the emergency room for evaluation tonight. She has an appointment scheduled with us tomorrow. I advised that she would end up going to the hospital for the ultrasound anyway though would not have to check into the ED if she came to our office first. She notes she may go to the ED for evaluation tomorrow and will let us know if she does this. I advised that if she develops any chest pain or shortness of breath or worsening pain she needs to call EMS for transport to the ED. She voiced understanding.

## 2016-09-06 ENCOUNTER — Ambulatory Visit: Payer: 59 | Admitting: Family Medicine

## 2016-09-06 ENCOUNTER — Emergency Department: Payer: 59

## 2016-09-06 ENCOUNTER — Emergency Department
Admission: EM | Admit: 2016-09-06 | Discharge: 2016-09-06 | Disposition: A | Discharge status: Home / Self Care | Payer: 59 | Attending: Emergency Medicine | Admitting: Emergency Medicine

## 2016-09-06 ENCOUNTER — Encounter: Payer: Self-pay | Admitting: Emergency Medicine

## 2016-09-06 DIAGNOSIS — Z79899 Other long term (current) drug therapy: Secondary | ICD-10-CM | POA: Insufficient documentation

## 2016-09-06 DIAGNOSIS — M79604 Pain in right leg: Secondary | ICD-10-CM | POA: Diagnosis present

## 2016-09-06 DIAGNOSIS — L03115 Cellulitis of right lower limb: Secondary | ICD-10-CM | POA: Insufficient documentation

## 2016-09-06 DIAGNOSIS — I10 Essential (primary) hypertension: Secondary | ICD-10-CM | POA: Insufficient documentation

## 2016-09-06 DIAGNOSIS — L03119 Cellulitis of unspecified part of limb: Secondary | ICD-10-CM

## 2016-09-06 DIAGNOSIS — R609 Edema, unspecified: Secondary | ICD-10-CM

## 2016-09-06 DIAGNOSIS — Z0289 Encounter for other administrative examinations: Secondary | ICD-10-CM

## 2016-09-06 MED ORDER — TRAMADOL HCL 50 MG PO TABS
ORAL_TABLET | ORAL | Status: AC
Start: 2016-09-06 — End: 2016-09-06
  Administered 2016-09-06: 50 mg
  Filled 2016-09-06: qty 1

## 2016-09-06 MED ORDER — TRAMADOL HCL 50 MG PO TABS: 50.0000 mg | ORAL_TABLET | Freq: Four times a day (QID) | ORAL | 0 refills | Status: DC | PRN

## 2016-09-06 MED ORDER — CLINDAMYCIN HCL 300 MG PO CAPS: 300.0000 mg | ORAL_CAPSULE | Freq: Three times a day (TID) | ORAL | 0 refills | Status: AC

## 2016-09-06 NOTE — Telephone Encounter (Signed)
Patient said that she was going to go to the ER today.

## 2016-09-06 NOTE — Telephone Encounter (Signed)
Can you contact the patient to see why she did not come to her visit today?

## 2016-09-06 NOTE — Discharge Instructions (Signed)
Please seek medical attention for any high fevers, chest pain, shortness of breath, change in behavior, persistent vomiting, bloody stool or any other new or concerning symptoms.  

## 2016-09-06 NOTE — ED Provider Notes (Signed)
[facility] Emergency Department Provider Note   ____________________________________________   I have reviewed the triage vital signs and the nursing notes.   HISTORY  Chief Complaint Leg Swelling   History limited by: Not Limited   HPI Julie Graham is a 50 y.o. female who presents to the emergency department today because of concerns for right leg pain. The patient was seen in [place] 3 days ago for these symptoms. During that visit she had an elevated d-dimer. At that time they apparently were not able to obtain an ultrasound did recommend that she follow up to get an ultrasound performed. Additionally the patient was put on antibiotics for presumed infection. Patient states that she has been on the antibiotic they've not been effective. Patient does have a history of cirrhosis and has somewhat chronic issues with peripheral edema. Patient has not had any fevers.    Past Medical History:  Diagnosis Date  . Alcoholism (HCC)   . Anal fissure    ?  Marland Kitchen. Anastomotic ulcer, acute   . Anxiety   . Blood in stool   . Cirrhosis (HCC)   . Confusion   . Depression   . Heart murmur   . Hypertension   . Hypoalbuminemia   . Portal hypertension (HCC)   . UTI (lower urinary tract infection)     Patient Active Problem List   Diagnosis Date Noted  . Pleural effusion 08/23/2016  . Rash and nonspecific skin eruption 08/23/2016  . SBP (spontaneous bacterial peritonitis) (HCC) 05/12/2016  . Leukocytosis 04/26/2016  . Candidal skin infection 04/26/2016  . Hypoglycemia 04/06/2016  . Alcoholic cirrhosis (HCC) 04/06/2016  . Anxiety and depression 04/06/2016  . Hematuria 02/10/2016  . Anxiety 02/10/2016  . Rib pain on right side 02/10/2016  . Abdominal pain 02/10/2016  . Bilateral lower extremity edema 02/10/2016  . Alcohol use disorder, severe, dependence (HCC) 06/01/2015  . 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 CESAREAN SECTION  1989  . CHOLECYSTECTOMY    . GASTRIC BYPASS  2009  . IR GENERIC HISTORICAL  06/02/2016   IR TRANSCATHETER BX 06/02/2016 Jolaine ClickArthur Hoss, MD [facility] RAD    Prior to Admission medications   Medication Sig Start Date End Date Taking? Authorizing Provider  Ascorbic Acid (VITAMIN C IMMUNE HEALTH PO) Take by mouth.    Historical Provider, MD  ciprofloxacin (CIPRO) 500 MG tablet Take 1 tablet (500 mg total) by mouth daily. 06/14/16   Beverley FiedlerJay M Pyrtle, MD  ciprofloxacin (CIPRO) 500 MG tablet TAKE 1 TABLET BY MOUTH TWO  TIMES DAILY 09/01/16   Beverley FiedlerJay M Pyrtle, MD  citalopram (CELEXA) 10 MG tablet Take 1 tablet (10 mg total) by mouth daily. 06/15/16   Glori LuisEric G Sonnenberg, MD  Cyanocobalamin (VITAMIN B-12 CR PO) Take by mouth.    Historical Provider, MD  docusate sodium (COLACE) 100 MG capsule Take 100 mg by mouth 2 (two) times daily.    Historical Provider, MD  folic acid (FOLVITE) 1 MG tablet Take 1 tablet (1 mg total) by mouth daily. 07/27/16 07/27/17  Beverley FiedlerJay M Pyrtle, MD  furosemide (LASIX) 20 MG tablet Take 3 tablets (60 mg total) by mouth 2 (two) times daily. 07/27/16   Beverley FiedlerJay M Pyrtle, MD  Lactulose 20 GM/30ML SOLN Take 30 mLs (20 g total) by mouth daily. 07/20/16   Beverley FiedlerJay M Pyrtle, MD  omeprazole (PRILOSEC) 40 MG capsule TAKE 1 CAPSULE  BY MOUTH  EVERY MORNING 07/06/16   Beverley Fiedler, MD  ondansetron (ZOFRAN) 4 MG tablet Take 1 tablet (4 mg total) by mouth 2 (two) times daily as needed for nausea or vomiting. 08/31/16   Glori Luis, MD  Polyethylene Glycol 3350 (MIRALAX PO) Take by mouth.    Historical Provider, MD  promethazine (PHENERGAN) 12.5 MG tablet Take 1-2 tablets by mouth every 6-8 hours as needed for nausea 07/20/16   Beverley Fiedler, MD  spironolactone (ALDACTONE) 100 MG tablet Take 1 tablet (100 mg total) by mouth 2 (two) times daily. 07/27/16   Beverley Fiedler, MD  traMADol (ULTRAM) 50 MG tablet Take 1-2 tablets (50-100 mg  total) by mouth every 8 (eight) hours as needed for moderate pain. 06/13/16   Beverley Fiedler, MD  triamcinolone cream (KENALOG) 0.1 % Apply 1 application topically 2 (two) times daily. 08/23/16   Glori Luis, MD    Allergies Sulfa antibiotics  Family History  Problem Relation Age of Onset  . Alcoholism Maternal Grandfather   . Stroke Maternal Grandfather   . Arthritis Mother   . Hyperlipidemia Mother   . Hypertension Mother   . Stroke Mother   . Lymphoma Mother   . Heart failure Mother   . COPD Mother   . Diabetes Mother   . Breast cancer Cousin   . Heart disease Father     CHF  . Diabetes Father   . Arthritis Father   . Factor V Leiden deficiency Father   . Hyperlipidemia Father   . Hypertension Father   . COPD Father   . Sudden death Paternal Uncle   . Lung cancer Maternal Grandmother     Social History Social History  Substance Use Topics  . Smoking status: Never Smoker  . Smokeless tobacco: Never Used  . Alcohol use No     Comment: stopped drinking 02/16/16    Review of Systems  Constitutional: Negative for fever. Cardiovascular: Negative for chest pain. Respiratory: Negative for shortness of breath. Gastrointestinal: Negative for abdominal pain, vomiting and diarrhea. Genitourinary: Negative for dysuria. Musculoskeletal: Negative for back pain. Skin: Negative for rash. Neurological: Negative for headaches, focal weakness or numbness.  10-point ROS otherwise negative.  ____________________________________________   PHYSICAL EXAM:  VITAL SIGNS: ED Triage Vitals  Enc Vitals Group     BP 09/06/16 1724 (!) 115/100     Pulse Rate 09/06/16 1724 60     Resp 09/06/16 1724 20     Temp 09/06/16 1724 97.8 F (36.6 C)     Temp Source 09/06/16 1724 Oral     SpO2 09/06/16 1724 98 %     Weight --      Height --      Head Circumference --      Peak Flow --      Pain Score 09/06/16 1725 8     Pain Loc --      Pain Edu? --      Excl. in GC? --       Constitutional: Alert and oriented. Well appearing and in no distress. Eyes: Conjunctivae are normal. Normal extraocular movements. ENT   Head: Normocephalic and atraumatic.   Nose: No congestion/rhinnorhea.   Mouth/Throat: Mucous membranes are moist.   Neck: No stridor. Hematological/Lymphatic/Immunilogical: No cervical lymphadenopathy. Cardiovascular: Normal rate, regular rhythm.  No murmurs, rubs, or gallops.  Respiratory: Normal respiratory effort without tachypnea nor retractions. Breath sounds are clear and equal bilaterally. No wheezes/rales/rhonchi. Gastrointestinal: Soft and  non tender. No rebound. No guarding.  Genitourinary: Deferred Musculoskeletal: Normal range of motion in all extremities. 3+ bilateral lower extremity edema. Neurologic:  Normal speech and language. No gross focal neurologic deficits are appreciated.  Skin:  3 wounds to right lower leg noted, two with purulent drainage. Some surrounding erythema.  Psychiatric: Mood and affect are normal. Speech and behavior are normal. Patient exhibits appropriate insight and judgment.  ____________________________________________    LABS (pertinent positives/negatives)  None  ____________________________________________   EKG  None  ____________________________________________    RADIOLOGY  US right LE IMPRESSION:  No evidence of deep venous thrombosis seen in right lower extremity,  although peroneal and posterior tibial veins are not well visualized  due to body habitus and overlying soft tissue swelling.   Right tib fib  IMPRESSION:  Normal right tibia and fibula. Focal soft tissue swelling is seen  anterior to proximal tibia concerning for edema or possibly  inflammation.      ____________________________________________   PROCEDURES  Procedures  ____________________________________________   INITIAL IMPRESSION / ASSESSMENT AND PLAN / ED COURSE  Pertinent labs &  imaging results that were available during my care of the patient were reviewed by me and considered in my medical decision making (see chart for details).  Patient presented to the emergency department today with concerns for right leg pain, wounds and possible blood clot. Ultrasound did not show any signs of DVT. I did obtain an x-ray which did not show any subcutaneous gas. At this point I doubt has fasciitis. Pain is certainly not out of proportion of exam. Will plan on brought any patient's antibiotics with clindamycin. Will give patient wound care center follow-up.  ____________________________________________   FINAL CLINICAL IMPRESSION(S) / ED DIAGNOSES  Final diagnoses:  Peripheral edema  Cellulitis of lower extremity, unspecified laterality     Note: This dictation was prepared with Dragon dictation. Any transcriptional errors that result from this process are unintentional     Phineas SemenGraydon Nephi Savage, MD 09/06/16 [phone]

## 2016-09-06 NOTE — ED Triage Notes (Signed)
Pt with right leg swelling. Pt thinks she has a dvt. Redness and swelling noted to right lower leg. Pt states has necrosis on lower leg.

## 2016-09-07 ENCOUNTER — Telehealth: Payer: Self-pay | Admitting: Family Medicine

## 2016-09-07 NOTE — Telephone Encounter (Signed)
FYI - Pt went to ED yesterday she does not have a blood clot, it is cellulitis. She was being [facility].  Call pt @ [phone]

## 2016-09-07 NOTE — Telephone Encounter (Signed)
Noted  

## 2016-09-07 NOTE — Telephone Encounter (Signed)
FYI

## 2016-09-14 ENCOUNTER — Other Ambulatory Visit: Payer: Self-pay | Admitting: Internal Medicine

## 2016-09-21 ENCOUNTER — Ambulatory Visit: Payer: 59 | Admitting: Family Medicine

## 2016-09-23 ENCOUNTER — Encounter: Payer: 59 | Attending: Surgery | Admitting: Surgery

## 2016-09-23 DIAGNOSIS — K7031 Alcoholic cirrhosis of liver with ascites: Secondary | ICD-10-CM | POA: Diagnosis not present

## 2016-09-23 DIAGNOSIS — L97213 Non-pressure chronic ulcer of right calf with necrosis of muscle: Secondary | ICD-10-CM | POA: Insufficient documentation

## 2016-09-23 DIAGNOSIS — I890 Lymphedema, not elsewhere classified: Secondary | ICD-10-CM | POA: Insufficient documentation

## 2016-09-24 NOTE — Progress Notes (Addendum)
CHENISE, MULVIHILL ([id number]) Visit Report for 09/23/2016 Chief Complaint Document Details Patient Name: Julie Graham, Julie Graham 09/23/2016 2:30 Date of Service: PM Medical Record [id number] Number: Patient Account Number: [id number] [birth year] (50 y.o. Treating RN: Huel Coventry Date of Birth/Sex: Female) Other Clinician: Primary Care Physician: Birdie Sons, ERIC Treating Evlyn Kanner Referring Physician: Phineas Semen Physician/Extender: Tania Ade in Treatment: 0 Information Obtained from: Patient Chief Complaint Patient seen for complaints of Non-Healing Wound to the right lower extremity which she's had for over a month Electronic Signature(s) Signed: 09/23/2016 4:07:07 PM By: Evlyn Kanner MD, FACS Entered By: Evlyn Kanner on 09/23/2016 16:07:07 Sherrin Daisy ([id number]) -------------------------------------------------------------------------------- HPI Details Patient Name: Schauer, Babita R. 09/23/2016 2:30 Date of Service: PM Medical Record [id number] Number: Patient Account Number: [id number] 1965/11/19 (50 y.o. Treating RN: Huel Coventry Date of Birth/Sex: Female) Other Clinician: Primary Care Physician: Birdie Sons, ERIC Treating Evlyn Kanner Referring Physician: Phineas Semen Physician/Extender: Weeks in Treatment: 0 History of Present Illness Location: ulcerated area over the right lower extremity Quality: Patient reports experiencing a sharp pain to affected area(s). Severity: Patient states wound are getting worse. Duration: Patient has had the wound for < 4 weeks prior to presenting for treatment Timing: Pain in wound is constant (hurts all the time) Context: The wound appeared gradually over time Modifying Factors: Other treatment(s) tried include:as in the ER recently and was sent to the wound care center Associated Signs and Symptoms: Patient reports having increase swelling. HPI Description: 50 year old patient who recently was in the ER on December 12 because of  right leg pain. there was no evidence of definite infection but she was put on antibiotics 3 days prior at an urgent care.she was placed on ciprofloxacin 500 mg twice a day. She has a past medical history of alcoholism, cirrhosis, hypoalbuminemia, portal hypertension. she is status post cesarean section, cholecystectomy, gastric bypass surgery Right lower extremity venous study done shows no evidence of DVT, and all the veins were not seen due to the body habitus. no gross venous reflux was noted.she also had a x-ray of the right tibia and fibula which was normal. during the month of November of this year she had been admitted to [facility] hospital with pleural effusion and ascites associated with dyspnea. She had a thoracocentesis and was in hospital for 6 days. Electronic Signature(s) Signed: 09/23/2016 4:07:56 PM By: Evlyn Kanner MD, FACS Previous Signature: 09/23/2016 2:50:16 PM Version By: Evlyn Kanner MD, FACS Entered By: Evlyn Kanner on 09/23/2016 16:07:56 Sherrin Daisy ([id number]) -------------------------------------------------------------------------------- Physical Exam Details Patient Name: Julie Graham 09/23/2016 2:30 Date of Service: PM Medical Record [id number] Number: Patient Account Number: [id number] 11/10/1965 (50 y.o. Treating RN: Huel Coventry Date of Birth/Sex: Female) Other Clinician: Primary Care Physician: Birdie Sons, ERIC Treating Evlyn Kanner Referring Physician: Phineas Semen Physician/Extender: Weeks in Treatment: 0 Constitutional . Pulse regular. Respirations normal and unlabored. Afebrile. . Eyes Nonicteric. Reactive to light. Ears, Nose, Mouth, and Throat Lips, teeth, and gums WNL.Marland Kitchen Moist mucosa without lesions. Neck supple and nontender. No palpable supraclavicular or cervical adenopathy. Normal sized without goiter. Respiratory WNL. No retractions.. Breath sounds WNL, No rubs, rales, rhonchi, or wheeze.. Cardiovascular Heart rhythm and rate  regular, no murmur or gallop.. Pedal Pulses WNL. ABI could not be measured due to severe tenderness in the leg. he has stage II lymphedema both lower extremities. Chest Breasts symmetical and no nipple discharge.. Breast tissue WNL, no masses, lumps, or tenderness.. Gastrointestinal (GI) Abdomen without masses or tenderness.. No liver or spleen enlargement or tenderness.. Lymphatic  No adneopathy. No adenopathy. No adenopathy. Musculoskeletal Adexa without tenderness or enlargement.. Digits and nails w/o clubbing, cyanosis, infection, petechiae, ischemia, or inflammatory conditions.. Integumentary (Hair, Skin) No suspicious lesions. No crepitus or fluctuance. No peri-wound warmth or erythema. No masses.Marland Kitchen Psychiatric Judgement and insight Intact.. No evidence of depression, anxiety, or agitation.. Notes patient has 2 smaller wounds which have necrotic debris which was washed out with moist saline gauze and has fairly healthy granulation tissue under this. However the right lateral compartment on her calf has a large necrotic ulcer which is extremely tender and has necrotic debris down to possibly muscle. It is too tender to debride in the office. Electronic Signature(s) AUBRIONNA, ISTRE ([id number]) Signed: 09/23/2016 4:08:56 PM By: Evlyn Kanner MD, FACS Entered By: Evlyn Kanner on 09/23/2016 16:08:56 Sherrin Daisy ([id number]) -------------------------------------------------------------------------------- Physician Orders Details Patient Name: Julie Graham 09/23/2016 2:30 Date of Service: PM Medical Record [id number] Number: Patient Account Number: [id number] 1965-10-23 (50 y.o. Treating RN: Huel Coventry Date of Birth/Sex: Female) Other Clinician: Primary Care Physician: Birdie Sons, ERIC Treating Evlyn Kanner Referring Physician: Phineas Semen Physician/Extender: Tania Ade in Treatment: 0 Verbal / Phone Orders: Yes Clinician: Huel Coventry Read Back and Verified: Yes Diagnosis  Coding Wound Cleansing Wound #1 Right,Medial Lower Leg o Clean wound with wound cleanser. Wound #2 Right,Distal,Medial Lower Leg o Clean wound with wound cleanser. Wound #3 Right,Proximal,Medial Lower Leg o Clean wound with wound cleanser. Anesthetic Wound #1 Right,Medial Lower Leg o Topical Lidocaine 4% cream applied to wound bed prior to debridement Wound #2 Right,Distal,Medial Lower Leg o Topical Lidocaine 4% cream applied to wound bed prior to debridement Wound #3 Right,Proximal,Medial Lower Leg o Topical Lidocaine 4% cream applied to wound bed prior to debridement Primary Wound Dressing Wound #1 Right,Medial Lower Leg o Aquacel Ag Wound #2 Right,Distal,Medial Lower Leg o Aquacel Ag Wound #3 Right,Proximal,Medial Lower Leg o Aquacel Ag Secondary Dressing Wound #1 Right,Medial Lower Leg o ABD and Kerlix/Conform Wound #2 Right,Distal,Medial Lower Leg Dauphine, Rosamund R. ([id number]) o ABD and Kerlix/Conform Wound #3 Right,Proximal,Medial Lower Leg o ABD and Kerlix/Conform Dressing Change Frequency Wound #1 Right,Medial Lower Leg o Change dressing every other day. Wound #2 Right,Distal,Medial Lower Leg o Change dressing every other day. Wound #3 Right,Proximal,Medial Lower Leg o Change dressing every other day. Follow-up Appointments Wound #1 Right,Medial Lower Leg o Return Appointment in 1 week. Wound #2 Right,Distal,Medial Lower Leg o Return Appointment in 1 week. Wound #3 Right,Proximal,Medial Lower Leg o Return Appointment in 1 week. Additional Orders / Instructions Wound #1 Right,Medial Lower Leg o Other: - Go to [facility] Emergency Department Wound #2 Right,Distal,Medial Lower Leg o Other: - Go to [facility] Emergency Department Wound #3 Right,Proximal,Medial Lower Leg o Other: - Go to [facility] Emergency Department Electronic Signature(s) Signed: 09/23/2016 4:27:48 PM By: Evlyn Kanner MD, FACS Signed: 09/23/2016 5:31:46 PM By: Elliot Gurney, RN,  BSN, Kim RN, BSN Entered By: Elliot Gurney, RN, BSN, Kim on 09/23/2016 15:47:40 Sherrin Daisy ([id number]) -------------------------------------------------------------------------------- Problem List Details Patient Name: SABRIN, DUNLEVY 09/23/2016 2:30 Date of Service: PM Medical Record [id number] Number: Patient Account Number: [id number] Oct 04, 1965 (50 y.o. 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 ([id number]) -------------------------------------------------------------------------------- Progress Note Details Patient Name: Sherrin DaisyLLOYD, Gwendalyn R. 09/23/2016 2:30 Date of Service: PM Medical Record [id number] Number: Patient Account Number: [id number] Sep 08, 1966 (50 y.o. Treating RN: Huel CoventryWoody, Kim Date of Birth/Sex: Female) Other Clinician: Primary Care Physician: Birdie SonsSONNENBERG, ERIC Treating Evlyn KannerBritto, Shavontae Gibeault Referring Physician: Phineas SemenGOODMAN, GRAYDON Physician/Extender: Weeks in Treatment: 0 Subjective Chief Complaint Information obtained from Patient Patient seen for complaints of Non-Healing Wound to the right lower extremity which she's had for over a month History of Present Illness (HPI) The following HPI elements were documented for the patient's wound: Location: ulcerated area over the right lower extremity Quality: Patient reports experiencing a sharp pain to  affected area(s). Severity: Patient states wound are getting worse. Duration: Patient has had the wound for < 4 weeks prior to presenting for treatment Timing: Pain in wound is constant (hurts all the time) Context: The wound appeared gradually over time Modifying Factors: Other treatment(s) tried include:as in the ER recently and was sent to the wound care center Associated Signs and Symptoms: Patient reports having increase swelling. 50 year old patient who recently was in the ER on December 12 because of right leg pain. there was no evidence of definite infection but she was put on antibiotics 3 days prior at an urgent care.she was placed on ciprofloxacin 500 mg twice a day. She has a past medical history of alcoholism, cirrhosis, hypoalbuminemia, portal hypertension. she is status post cesarean section, cholecystectomy, gastric bypass surgery Right lower extremity venous study done shows no evidence of DVT, and all the veins were not seen due to the body habitus. no gross venous reflux was noted.she also had a x-ray of the right tibia and fibula which was normal. during the month of November of this year she had been [facility] with pleural effusion and ascites associated with dyspnea. She had a thoracocentesis and was in hospital for 6 days. Wound History Patient presents with 3 open wounds that have been present for approximately 1 month. Patient has been treating wounds in the following manner: peroxide, air. Laboratory tests have been performed in the last month. Patient reportedly has not tested positive for an antibiotic resistant organism. Patient reportedly has not tested positive for osteomyelitis. Patient reportedly has not had testing performed to evaluate circulation in the legs. Sherrin DaisyLLOYD, Fartun R. ([id number]) Patient History Allergies sulfa drugs Family History Cancer - Mother, Maternal Grandparents, Diabetes - Mother, Father, Heart Disease - Mother,  Father, Hypertension - Mother, Father, Lung Disease - Father, Stroke - Maternal Grandparents, No family history of Kidney Disease, Seizures, Thyroid Problems, Tuberculosis. Social History Never smoker, Marital Status - Divorced, Alcohol Use - Never, Drug Use - No History, Caffeine Use - Rarely. Medical History Eyes Denies history of Cataracts, Glaucoma, Optic Neuritis Ear/Nose/Mouth/Throat Denies history of Chronic sinus problems/congestion, Middle ear problems Hematologic/Lymphatic Denies history of Anemia, Hemophilia, Human Immunodeficiency Virus, Lymphedema, Sickle Cell Disease Respiratory Denies history of Aspiration, Asthma, Chronic Obstructive Pulmonary Disease (COPD), Pneumothorax, Sleep Apnea, Tuberculosis Cardiovascular Denies history of Angina, Arrhythmia, Congestive Heart Failure, Coronary Artery Disease, Deep Vein Thrombosis, Hypertension, Hypotension, Myocardial Infarction, Peripheral Arterial Disease, Peripheral Venous Disease, Phlebitis, Vasculitis Gastrointestinal Patient has history of Cirrhosis - Sober since 01/2016 Denies history of Colitis, Crohn s, Hepatitis A, Hepatitis B, Hepatitis C Endocrine Denies history of Type I Diabetes, Type II Diabetes Genitourinary Denies history of End Stage Renal Disease Immunological Denies history of  Lupus Erythematosus, Raynaud s, Scleroderma Integumentary (Skin) Denies history of History of Burn, History of pressure wounds Musculoskeletal Denies history of Gout, Rheumatoid Arthritis, Osteoarthritis, Osteomyelitis Neurologic Denies history of Dementia, Neuropathy, Quadriplegia, Paraplegia, Seizure Disorder Oncologic Denies history of Received Chemotherapy, Received Radiation Psychiatric Denies history of Anorexia/bulimia, Confinement Anxiety Hospitalization/Surgery History - [facility], Fluid around lung. Medical And Surgical History Notes ROCHELLE, LARUE ([id number]) Hematologic/Lymphatic Factor 5 Respiratory Fluid around  right lung Review of Systems (ROS) Constitutional Symptoms (General Health) The patient has no complaints or symptoms. Eyes The patient has no complaints or symptoms. Ear/Nose/Mouth/Throat The patient has no complaints or symptoms. Hematologic/Lymphatic The patient has no complaints or symptoms. Respiratory The patient has no complaints or symptoms. Cardiovascular The patient has no complaints or symptoms. Gastrointestinal The patient has no complaints or symptoms. Endocrine Denies complaints or symptoms of Hepatitis, Thyroid disease, Polydypsia (Excessive Thirst). Genitourinary Denies complaints or symptoms of Kidney failure/ Dialysis, Incontinence/dribbling. Immunological Complains or has symptoms of Hives, Itching. Integumentary (Skin) Complains or has symptoms of Wounds, Bleeding or bruising tendency - bruise easy. Denies complaints or symptoms of Breakdown, Swelling. Musculoskeletal The patient has no complaints or symptoms. Neurologic The patient has no complaints or symptoms. Oncologic The patient has no complaints or symptoms. Psychiatric Complains or has symptoms of Anxiety. Denies complaints or symptoms of Claustrophobia. medications: reviewed her list of medications which include cyanocobalamin, folic acid, Lasix, gabapentin, lactulose, omeprazole, Spiriva VAC tone, tramadol Polivka, Lillyian R. ([id number]) Objective Constitutional Pulse regular. Respirations normal and unlabored. Afebrile. Vitals Time Taken: 2:39 PM, Height: 65 in, Source: Stated, Weight: 218 lbs, Source: Stated, BMI: 36.3, Temperature: 97.7 F, Pulse: 88 bpm, Respiratory Rate: 16 breaths/min, Blood Pressure: 106/34 mmHg. Eyes Nonicteric. Reactive to light. Ears, Nose, Mouth, and Throat Lips, teeth, and gums WNL.Marland Kitchen Moist mucosa without lesions. Neck supple and nontender. No palpable supraclavicular or cervical adenopathy. Normal sized without goiter. Respiratory WNL. No retractions.. Breath  sounds WNL, No rubs, rales, rhonchi, or wheeze.. Cardiovascular Heart rhythm and rate regular, no murmur or gallop.. Pedal Pulses WNL. ABI could not be measured due to severe tenderness in the leg. he has stage II lymphedema both lower extremities. Chest Breasts symmetical and no nipple discharge.. Breast tissue WNL, no masses, lumps, or tenderness.. Gastrointestinal (GI) Abdomen without masses or tenderness.. No liver or spleen enlargement or tenderness.. Lymphatic No adneopathy. No adenopathy. No adenopathy. Musculoskeletal Adexa without tenderness or enlargement.. Digits and nails w/o clubbing, cyanosis, infection, petechiae, ischemia, or inflammatory conditions.Marland Kitchen Psychiatric Judgement and insight Intact.. No evidence of depression, anxiety, or agitation.. General Notes: patient has 2 smaller wounds which have necrotic debris which was washed out with moist saline gauze and has fairly healthy granulation tissue under this. However the right lateral compartment on her calf has a large necrotic ulcer which is extremely tender and has necrotic debris down to possibly muscle. It is too tender to debride in the office. Integumentary (Hair, Skin) No suspicious lesions. No crepitus or fluctuance. No peri-wound warmth or erythema. No masses.Marland Kitchen Stayce, Delancy Lorra RMarland Kitchen ([id number]) Wound #1 status is Open. Original cause of wound was Gradually Appeared. The wound is located on the Right,Medial Lower Leg. The wound measures 3.4cm length x 4cm width x 1cm depth; 10.681cm^2 area and 10.681cm^3 volume. There is fat exposed. There is a large amount of serosanguineous drainage noted. The wound margin is thickened. There is no granulation within the wound bed. There is a large (67- 100%) amount of necrotic tissue within the wound bed including Eschar and Adherent  Slough. The periwound skin appearance exhibited: Moist, Erythema. The periwound skin appearance did not exhibit: Callus, Crepitus, Excoriation,  Fluctuance, Friable, Induration, Localized Edema, Rash, Scarring, Dry/Scaly, Maceration, Atrophie Blanche, Cyanosis, Ecchymosis, Hemosiderin Staining, Mottled, Pallor, Rubor. The surrounding wound skin color is noted with erythema which is circumferential. Wound #2 status is Open. Original cause of wound was Gradually Appeared. The wound is located on the Right,Distal,Medial Lower Leg. The wound measures 1.8cm length x 2cm width x 0.2cm depth; 2.827cm^2 area and 0.565cm^3 volume. There is fat exposed. There is a medium amount of serous drainage noted. The wound margin is flat and intact. There is no granulation within the wound bed. There is a large (67- 100%) amount of necrotic tissue within the wound bed including Eschar and Adherent Slough. The periwound skin appearance exhibited: Ecchymosis, Erythema. The surrounding wound skin color is noted with erythema. Periwound temperature was noted as No Abnormality. Wound #3 status is Open. Original cause of wound was Gradually Appeared. The wound is located on the Right,Proximal,Medial Lower Leg. The wound measures 1.3cm length x 1.5cm width x 0.1cm depth; 1.532cm^2 area and 0.153cm^3 volume. There is fat exposed. There is no tunneling or undermining noted. There is a small amount of serous drainage noted. The wound margin is flat and intact. There is no granulation within the wound bed. There is a large (67-100%) amount of necrotic tissue within the wound bed including Eschar. The periwound skin appearance exhibited: Ecchymosis, Erythema. The periwound skin appearance did not exhibit: Callus, Crepitus, Excoriation, Fluctuance, Friable, Induration, Localized Edema, Rash, Scarring, Dry/Scaly, Maceration, Moist, Atrophie Blanche, Cyanosis, Hemosiderin Staining, Mottled, Pallor, Rubor. The surrounding wound skin color is noted with erythema. Periwound temperature was noted as No Abnormality. The periwound has tenderness on palpation. Assessment Active  Problems ICD-10 L97.213 - Non-pressure chronic ulcer of right calf with necrosis of muscle I89.0 - Lymphedema, not elsewhere classified K70.31 - Alcoholic cirrhosis of liver with ascites this 50 year old patient who is known to have alcoholic cirrhosis with ascites, lymphedema and is on the transplant list has been seeing the specialist at [facility]. Most recently she was in the ER for leg Pavlich, Elveria R. ([id number]) pain and has been investigated for an ulcerated area on the right lower extremity and I have reviewed these results. I have recommended: 1. Surgical debridement of the large ulcerated area with full-thickness necrosis possibly down to muscle. Once this is done a wound VAC system will probably help her and we would be able to take care of her wounds 2. good control of her lymphedema with appropriate diuretics as per her gastroenterologist and primary care physician 3. Adequate nutrition, vitamin C, vitamin A and zinc 4. She is reluctant to be referred to an ER here and would like to go to [facility] but she will defer this until she sees her primary care doctor Her options and treatment plans discussed with her and she is in full understanding of the gravity of the situation Plan Wound Cleansing: Wound #1 Right,Medial Lower Leg: Clean wound with wound cleanser. Wound #2 Right,Distal,Medial Lower Leg: Clean wound with wound cleanser. Wound #3 Right,Proximal,Medial Lower Leg: Clean wound with wound cleanser. Anesthetic: Wound #1 Right,Medial Lower Leg: Topical Lidocaine 4% cream applied to wound bed prior to debridement Wound #2 Right,Distal,Medial Lower Leg: Topical Lidocaine 4% cream applied to wound bed prior to debridement Wound #3 Right,Proximal,Medial Lower Leg: Topical Lidocaine 4% cream applied to wound bed prior to debridement Primary Wound Dressing: Wound #1 Right,Medial Lower Leg:  Aquacel Ag Wound #2 Right,Distal,Medial Lower Leg: Aquacel Ag Wound  #3 Right,Proximal,Medial Lower Leg: Aquacel Ag Secondary Dressing: Wound #1 Right,Medial Lower Leg: ABD and Kerlix/Conform Wound #2 Right,Distal,Medial Lower Leg: ABD and Kerlix/Conform Wound #3 Right,Proximal,Medial Lower Leg: ABD and Kerlix/Conform Dressing Change Frequency: Wound #1 Right,Medial Lower Leg: Rokosz, Nasya R. ([id number]) Change dressing every other day. Wound #2 Right,Distal,Medial Lower Leg: Change dressing every other day. Wound #3 Right,Proximal,Medial Lower Leg: Change dressing every other day. Follow-up Appointments: Wound #1 Right,Medial Lower Leg: Return Appointment in 1 week. Wound #2 Right,Distal,Medial Lower Leg: Return Appointment in 1 week. Wound #3 Right,Proximal,Medial Lower Leg: Return Appointment in 1 week. Additional Orders / Instructions: Wound #1 Right,Medial Lower Leg: Other: - Go to [facility] Emergency Department Wound #2 Right,Distal,Medial Lower Leg: Other: - Go to [facility] Emergency Department Wound #3 Right,Proximal,Medial Lower Leg: Other: - Go to [facility] Emergency Department this 50 year old patient who is known to have alcoholic cirrhosis with ascites, lymphedema and is on the transplant list has been seeing the specialist at [facility]. Most recently she was in the ER for leg pain and has been investigated for an ulcerated area on the right lower extremity and I have reviewed these results. I have recommended: 1. Surgical debridement of the large ulcerated area with full-thickness necrosis possibly down to muscle. Once this is done a wound VAC system will probably help her and we would be able to take care of her wounds 2. good control of her lymphedema with appropriate diuretics as per her gastroenterologist and primary care physician 3. Adequate nutrition, vitamin C, vitamin A and zinc 4. She is reluctant to be referred to an ER here and would like to go to [facility] but she will defer this until she sees her primary care doctor Her  options and treatment plans discussed with her and she is in full understanding of the gravity of the situation Electronic Signature(s) Signed: 09/23/2016 4:12:02 PM By: Evlyn Kanner MD, FACS Entered By: Evlyn Kanner on 09/23/2016 16:12:02 Sherrin Daisy ([id number]) Sherrin Daisy ([id number]) -------------------------------------------------------------------------------- ROS/PFSH Details Patient Name: Sherrin Daisy 09/23/2016 2:30 Date of Service: PM Medical Record [id number] Number: Patient Account Number: [id number] 08/10/1966 (50 y.o. Treating RN: Huel Coventry Date of Birth/Sex: Female) Other Clinician: Primary Care Physician: Birdie Sons, ERIC Treating Augusto Deckman Referring Physician: Phineas Semen Physician/Extender: Weeks in Treatment: 0 Wound History Do you currently have one or more open woundso Yes How many open wounds do you currently haveo 3 Approximately how long have you had your woundso 1 month How have you been treating your wound(s) until nowo peroxide, air Has your wound(s) ever healed and then re-openedo No Have you had any lab work done in the past montho Yes Who ordered the lab work [employer] Have you tested positive for an antibiotic resistant organism (MRSA, VRE)o No Have you tested positive for osteomyelitis (bone infection)o No Have you had any tests for circulation on your legso No Hematologic/Lymphatic Complaints and Symptoms: No Complaints or Symptoms Complaints and Symptoms: Negative for: Bleeding / Clotting Disorders; Human Immunodeficiency Virus Medical History: Negative for: Anemia; Hemophilia; Human Immunodeficiency Virus; Lymphedema; Sickle Cell Disease Past Medical History Notes: Factor 5 Respiratory Complaints and Symptoms: No Complaints or Symptoms Complaints and Symptoms: Negative for: Chronic or frequent coughs; Shortness of Breath Medical History: Negative for: Aspiration; Asthma; Chronic Obstructive Pulmonary Disease (COPD);  Pneumothorax; Sleep Apnea; Tuberculosis Past Medical History Notes: Fluid around right lung Endocrine Soh, Roschelle R. ([id number]) Complaints and Symptoms: Negative  for: Hepatitis; Thyroid disease; Polydypsia (Excessive Thirst) Medical History: Negative for: Type I Diabetes; Type II Diabetes Genitourinary Complaints and Symptoms: Negative for: Kidney failure/ Dialysis; Incontinence/dribbling Medical History: Negative for: End Stage Renal Disease Immunological Complaints and Symptoms: Positive for: Hives; Itching Medical History: Negative for: Lupus Erythematosus; Raynaudos; Scleroderma Integumentary (Skin) Complaints and Symptoms: Positive for: Wounds; Bleeding or bruising tendency - bruise easy Negative for: Breakdown; Swelling Medical History: Negative for: History of Burn; History of pressure wounds Neurologic Complaints and Symptoms: No Complaints or Symptoms Complaints and Symptoms: Negative for: Numbness/parasthesias; Focal/Weakness Medical History: Negative for: Dementia; Neuropathy; Quadriplegia; Paraplegia; Seizure Disorder Psychiatric Complaints and Symptoms: Positive for: Anxiety Negative for: Claustrophobia Medical History: Negative for: Anorexia/bulimia; Confinement Anxiety Locust, Clorinda R. ([id number]) Constitutional Symptoms (General Health) Complaints and Symptoms: No Complaints or Symptoms Eyes Complaints and Symptoms: No Complaints or Symptoms Medical History: Negative for: Cataracts; Glaucoma; Optic Neuritis Ear/Nose/Mouth/Throat Complaints and Symptoms: No Complaints or Symptoms Medical History: Negative for: Chronic sinus problems/congestion; Middle ear problems Cardiovascular Complaints and Symptoms: No Complaints or Symptoms Medical History: Negative for: Angina; Arrhythmia; Congestive Heart Failure; Coronary Artery Disease; Deep Vein Thrombosis; Hypertension; Hypotension; Myocardial Infarction; Peripheral Arterial Disease;  Peripheral Venous Disease; Phlebitis; Vasculitis Gastrointestinal Complaints and Symptoms: No Complaints or Symptoms Medical History: Positive for: Cirrhosis - Sober since 01/2016 Negative for: Colitis; Crohnos; Hepatitis A; Hepatitis B; Hepatitis C Musculoskeletal Complaints and Symptoms: No Complaints or Symptoms Medical History: Negative for: Gout; Rheumatoid Arthritis; Osteoarthritis; Osteomyelitis Oncologic Complaints and Symptoms: No Complaints or Symptoms Swords, Louise R. ([id number]) Medical History: Negative for: Received Chemotherapy; Received Radiation Immunizations Pneumococcal Vaccine: Received Pneumococcal Vaccination: Yes Hospitalization / Surgery History Name of Hospital Purpose of Hospitalization/Surgery Date [facility] Fluid around lung Family and Social History Cancer: Yes - Mother, Maternal Grandparents; Diabetes: Yes - Mother, Father; Heart Disease: Yes - Mother, Father; Hypertension: Yes - Mother, Father; Kidney Disease: No; Lung Disease: Yes - Father; Seizures: No; Stroke: Yes - Maternal Grandparents; Thyroid Problems: No; Tuberculosis: No; Never smoker; Marital Status - Divorced; Alcohol Use: Never; Drug Use: No History; Caffeine Use: Rarely; Financial Concerns: No; Food, Clothing or Shelter Needs: No; Support System Lacking: No; Transportation Concerns: No; Advanced Directives: No; Patient does not want information on Advanced Directives; Do not resuscitate: No; Living Will: No; Medical Power of Attorney: No Physician Affirmation I have reviewed and agree with the above information. Electronic Signature(s) Signed: 09/23/2016 4:27:48 PM By: Evlyn Kanner MD, FACS Signed: 09/23/2016 5:31:46 PM By: Elliot Gurney RN, BSN, Kim RN, BSN Entered By: Evlyn Kanner on 09/23/2016 15:19:58 Lehmkuhl, Syrenity Elvera Lennox ([id number]) -------------------------------------------------------------------------------- SuperBill Details Patient Name: Sherrin Daisy. Date of Service: 09/23/2016 Medical  Record Number: [id number] Patient Account Number: [id number] Date of Birth/Sex: [birth year] (50 y.o. Female) Treating RN: Huel Coventry Primary Care Physician: Birdie Sons, ERIC Other Clinician: Referring Physician: Phineas Semen Treating Physician/Extender: Rudene Re in Treatment: 0 Diagnosis Coding ICD-10 Codes Code Description 2362052400 Non-pressure chronic ulcer of right calf with necrosis of muscle I89.0 Lymphedema, not elsewhere classified K70.31 Alcoholic cirrhosis of liver with ascites Facility Procedures CPT4 Code: 25956387 Description: 99214 - WOUND CARE VISIT-LEV 4 EST PT Modifier: Quantity: 1 Physician Procedures CPT4 Code Description: 5643329 51884 - WC PHYS LEVEL 4 - NEW PT ICD-10 Description Diagnosis L97.213 Non-pressure chronic ulcer of right calf with necr I89.0 Lymphedema, not elsewhere classified K70.31 Alcoholic cirrhosis of liver with ascites Modifier: osis of muscle Quantity: 1 Electronic Signature(s) Unsigned Previous Signature: 09/23/2016 4:12:21 PM Version By: Evlyn Kanner MD, FACS Entered By: Elliot Gurney, RN, BSN,  Kim on 09/23/2016 17:52:15 Signature(s): Date(s):

## 2016-09-24 NOTE — Progress Notes (Signed)
Julie Graham, Patrizia R. ([id number]) Visit Report for 09/23/2016 Abuse/Suicide Risk Screen Details Patient Name: Julie Graham, Julie R. 09/23/2016 2:30 Date of Service: PM Medical Record [id number] Number: Patient Account Number: [id number] [birth year] (50 y.o. Treating RN: Huel CoventryWoody, Kim Date of Birth/Sex: Female) Other Clinician: Primary Care Physician: Birdie SonsSONNENBERG, ERIC Treating Britto, Errol Referring Physician: Phineas SemenGOODMAN, GRAYDON Physician/Extender: Weeks in Treatment: 0 Abuse/Suicide Risk Screen Items Answer ABUSE/SUICIDE RISK SCREEN: Has anyone close to you tried to hurt or harm you recentlyo No Do you feel uncomfortable with anyone in your familyo No Has anyone forced you do things that you didnot want to doo No Do you have any thoughts of harming yourselfo No Patient displays signs or symptoms of abuse and/or neglect. No Electronic Signature(s) Signed: 09/23/2016 5:31:46 PM By: Elliot GurneyWoody, RN, BSN, Kim RN, BSN Entered By: Elliot GurneyWoody, RN, BSN, Kim on 09/23/2016 15:11:12 Julie Graham, Julie R. ([id number]) -------------------------------------------------------------------------------- Activities of Daily Living Details Patient Name: Julie Graham, Julie R. 09/23/2016 2:30 Date of Service: PM Medical Record [id number] Number: Patient Account Number: [id number] [birth year] (50 y.o. Treating RN: Huel CoventryWoody, Kim Date of Birth/Sex: Female) Other Clinician: Primary Care Physician: Birdie SonsSONNENBERG, ERIC Treating Evlyn KannerBritto, Errol Referring Physician: Phineas SemenGOODMAN, GRAYDON Physician/Extender: Weeks in Treatment: 0 Activities of Daily Living Items Answer Activities of Daily Living (Please select one for each item) Drive Automobile Completely Able Take Medications Completely Able Use Telephone Completely Able Care for Appearance Completely Able Use Toilet Completely Able Bath / Shower Completely Able Dress Self Completely Able Feed Self Completely Able Walk Completely Able Get In / Out Bed Completely Able Housework Completely  Able Prepare Meals Completely Able Handle Money Completely Able Shop for Self Completely Able Electronic Signature(s) Signed: 09/23/2016 5:31:46 PM By: Elliot GurneyWoody, RN, BSN, Kim RN, BSN Entered By: Elliot GurneyWoody, RN, BSN, Kim on 09/23/2016 15:11:25 Julie Graham, Julie R. ([id number]) -------------------------------------------------------------------------------- Education Assessment Details Patient Name: Julie Graham, Julie R. 09/23/2016 2:30 Date of Service: PM Medical Record [id number] Number: Patient Account Number: [id number] [birth year] (50 y.o. Treating RN: Huel CoventryWoody, Kim Date of Birth/Sex: Female) Other Clinician: Primary Care Physician: Birdie SonsSONNENBERG, ERIC Treating Evlyn KannerBritto, Errol Referring Physician: Phineas SemenGOODMAN, GRAYDON Physician/Extender: Tania AdeWeeks in Treatment: 0 Primary Learner Assessed: Patient Learning Preferences/Education Level/Primary Language Learning Preference: Explanation Highest Education Level: College or Above Preferred Language: English Cognitive Barrier Assessment/Beliefs Language Barrier: No Translator Needed: No Memory Deficit: No Emotional Barrier: No Cultural/Religious Beliefs Affecting Medical No Care: Physical Barrier Assessment Impaired Vision: No Impaired Hearing: No Decreased Hand dexterity: No Knowledge/Comprehension Assessment Knowledge Level: High Comprehension Level: High Ability to understand written High instructions: Ability to understand verbal High instructions: Motivation Assessment Anxiety Level: Calm Cooperation: Cooperative Education Importance: Acknowledges Need Interest in Health Problems: Asks Questions Perception: Coherent Willingness to Engage in Self- High Management Activities: Readiness to Engage in Self- High Management Activities: Julie Graham, Julie R. ([id number]) Electronic Signature(s) Signed: 09/23/2016 5:31:46 PM By: Elliot GurneyWoody, RN, BSN, Kim RN, BSN Entered By: Elliot GurneyWoody, RN, BSN, Kim on 09/23/2016 15:11:51 Julie Graham, Julie R.  ([id number]) -------------------------------------------------------------------------------- Fall Risk Assessment Details Patient Name: Julie Graham, Julie R. 09/23/2016 2:30 Date of Service: PM Medical Record [id number] Number: Patient Account Number: [id number] [birth year] (50 y.o. Treating RN: Huel CoventryWoody, Kim Date of Birth/Sex: Female) Other Clinician: Primary Care Physician: Birdie SonsSONNENBERG, ERIC Treating Evlyn KannerBritto, Errol Referring Physician: Phineas SemenGOODMAN, GRAYDON Physician/Extender: Weeks in Treatment: 0 Fall Risk Assessment Items Have you had 2 or more falls in the last 12 monthso 0 No Have you had any fall that resulted in injury in the last 12 monthso 0 No FALL RISK ASSESSMENT: History of falling - immediate or within 3  months 0 No Secondary diagnosis 0 No Ambulatory aid None/bed rest/wheelchair/nurse 0 Yes Crutches/cane/walker 0 No Furniture 0 No IV Access/Saline Lock 0 No Gait/Training Normal/bed rest/immobile 0 Yes Weak 0 No Impaired 0 No Mental Status Oriented to own ability 0 Yes Electronic Signature(s) Signed: 09/23/2016 5:31:46 PM By: Elliot GurneyWoody, RN, BSN, Kim RN, BSN Entered By: Elliot GurneyWoody, RN, BSN, Kim on 09/23/2016 15:12:01 Julie Graham, Julie R. ([id number]) -------------------------------------------------------------------------------- Foot Assessment Details Patient Name: Julie Graham, Julie R. 09/23/2016 2:30 Date of Service: PM Medical Record [id number] Number: Patient Account Number: [id number] December 30, 1965 (50 y.o. Treating RN: Huel CoventryWoody, Kim Date of Birth/Sex: Female) Other Clinician: Primary Care Physician: Birdie SonsSONNENBERG, ERIC Treating Britto, Errol Referring Physician: Phineas SemenGOODMAN, GRAYDON Physician/Extender: Weeks in Treatment: 0 Foot Assessment Items Site Locations + = Sensation present, - = Sensation absent, C = Callus, U = Ulcer R = Redness, W = Warmth, M = Maceration, PU = Pre-ulcerative lesion F = Fissure, S = Swelling, D = Dryness Assessment Right: Left: Other Deformity: No No Prior Foot  Ulcer: No No Prior Amputation: No No Charcot Joint: No No Ambulatory Status: Ambulatory Without Help Gait: Steady Electronic Signature(s) Signed: 09/23/2016 5:31:46 PM By: Elliot GurneyWoody, RN, BSN, Kim RN, BSN Entered By: Elliot GurneyWoody, RN, BSN, Kim on 09/23/2016 15:13:45 Julie Graham, Marigene R. ([id number]) -------------------------------------------------------------------------------- Nutrition Risk Assessment Details Patient Name: Julie Graham, Felicia R. 09/23/2016 2:30 Date of Service: PM Medical Record [id number] Number: Patient Account Number: [id number] December 30, 1965 (50 y.o. Treating RN: Huel CoventryWoody, Kim Date of Birth/Sex: Female) Other Clinician: Primary Care Physician: Birdie SonsSONNENBERG, ERIC Treating Evlyn KannerBritto, Errol Referring Physician: Phineas SemenGOODMAN, GRAYDON Physician/Extender: Weeks in Treatment: 0 Height (in): 65 Weight (lbs): 218 Body Mass Index (BMI): 36.3 Nutrition Risk Assessment Items NUTRITION RISK SCREEN: I have an illness or condition that made me change the kind and/or 0 No amount of food I eat I eat fewer than two meals per day 0 No I eat few fruits and vegetables, or milk products 0 No I have three or more drinks of beer, liquor or wine almost every day 0 No I have tooth or mouth problems that make it hard for me to eat 0 No I don't always have enough money to buy the food I need 0 No I eat alone most of the time 1 Yes I take three or more different prescribed or over-the-counter drugs a 0 No day Without wanting to, I have lost or gained 10 pounds in the last six 0 No months I am not always physically able to shop, cook and/or feed myself 0 No Nutrition Protocols Good Risk Protocol 0 No interventions needed Moderate Risk Protocol Electronic Signature(s) Signed: 09/23/2016 5:31:46 PM By: Elliot GurneyWoody, RN, BSN, Kim RN, BSN Entered By: Elliot GurneyWoody, RN, BSN, Kim on 09/23/2016 15:12:30

## 2016-09-24 NOTE — Progress Notes (Addendum)
RHYSE, SKOWRON ([id number]) Visit Report for 09/23/2016 Allergy List Details Patient Name: Julie Graham, Julie Graham. Date of Service: 09/23/2016 2:30 PM Medical Record Number: [id number] Patient Account Number: [id number] Date of Birth/Sex: [birth year] (50 y.o. Female) Treating RN: Huel Coventry Primary Care Physician: Birdie Sons, ERIC Other Clinician: Referring Physician: Phineas Semen Treating Physician/Extender: Rudene Re in Treatment: 0 Allergies Active Allergies sulfa drugs Allergy Notes Electronic Signature(s) Signed: 09/23/2016 5:31:46 PM By: Elliot Gurney, RN, BSN, Kim RN, BSN Entered By: Elliot Gurney, RN, BSN, Kim on 09/23/2016 15:04:20 Julie Graham ([id number]) -------------------------------------------------------------------------------- Arrival Information Details Patient Name: Julie Graham Date of Service: 09/23/2016 2:30 PM Medical Record Number: [id number] Patient Account Number: [id number] Date of Birth/Sex: [birth year] (50 y.o. Female) Treating RN: Huel Coventry Primary Care Physician: Birdie Sons, ERIC Other Clinician: Referring Physician: Phineas Semen Treating Physician/Extender: Rudene Re in Treatment: 0 Visit Information Patient Arrived: Ambulatory Arrival Time: 14:38 Transfer Assistance: None Patient Identification Verified: Yes Secondary Verification Process Yes Completed: Patient Requires Transmission- No Based Precautions: Patient Has Alerts: Yes Patient Alerts: No ABI dt extreme pain Electronic Signature(s) Signed: 09/23/2016 5:31:46 PM By: Elliot Gurney, RN, BSN, Kim RN, BSN Entered By: Elliot Gurney, RN, BSN, Kim on 09/23/2016 15:50:07 Julie Graham ([id number]) -------------------------------------------------------------------------------- Clinic Level of Care Assessment Details Patient Name: Julie Graham. Date of Service: 09/23/2016 2:30 PM Medical Record Number: [id number] Patient Account Number: [id number] Date of Birth/Sex: 04-Jun-1966 (50 y.o.  Female) Treating RN: Huel Coventry Primary Care Physician: Birdie Sons, ERIC Other Clinician: Referring Physician: Phineas Semen Treating Physician/Extender: Rudene Re in Treatment: 0 Clinic Level of Care Assessment Items TOOL 2 Quantity Score []  - Use when only an EandM is performed on the INITIAL visit 0 ASSESSMENTS - Nursing Assessment / Reassessment X - General Physical Exam (combine w/ comprehensive assessment (listed just 1 20 below) when performed on new pt. evals) X - Comprehensive Assessment (HX, ROS, Risk Assessments, Wounds Hx, etc.) 1 25 ASSESSMENTS - Wound and Skin Assessment / Reassessment []  - Simple Wound Assessment / Reassessment - one wound 0 X - Complex Wound Assessment / Reassessment - multiple wounds 1 5 []  - Dermatologic / Skin Assessment (not related to wound area) 0 ASSESSMENTS - Ostomy and/or Continence Assessment and Care []  - Incontinence Assessment and Management 0 []  - Ostomy Care Assessment and Management (repouching, etc.) 0 PROCESS - Coordination of Care X - Simple Patient / Family Education for ongoing care 1 15 []  - Complex (extensive) Patient / Family Education for ongoing care 0 X - Staff obtains Chiropractor, Records, Test Results / Process Orders 1 10 []  - Staff telephones HHA, Nursing Homes / Clarify orders / etc 0 []  - Routine Transfer to another Facility (non-emergent condition) 0 []  - Routine Hospital Admission (non-emergent condition) 0 []  - New Admissions / Manufacturing engineer / Ordering NPWT, Apligraf, etc. 0 []  - Emergency Hospital Admission (emergent condition) 0 X - Simple Discharge Coordination 1 10 Julie Graham, Julie R. ([id number]) []  - Complex (extensive) Discharge Coordination 0 PROCESS - Special Needs []  - Pediatric / Minor Patient Management 0 []  - Isolation Patient Management 0 []  - Hearing / Language / Visual special needs 0 []  - Assessment of Community assistance (transportation, D/C planning, etc.) 0 []  - Additional  assistance / Altered mentation 0 []  - Support Surface(s) Assessment (bed, cushion, seat, etc.) 0 INTERVENTIONS - Wound Cleansing / Measurement X - Wound Imaging (photographs - any number of wounds) 1 5 []  - Wound Tracing (instead of photographs) 0 []  - Simple Wound Measurement - one  wound 0 X - Complex Wound Measurement - multiple wounds 3 5 []  - Simple Wound Cleansing - one wound 0 X - Complex Wound Cleansing - multiple wounds 3 5 INTERVENTIONS - Wound Dressings []  - Small Wound Dressing one or multiple wounds 0 X - Medium Wound Dressing one or multiple wounds 3 15 []  - Large Wound Dressing one or multiple wounds 0 []  - Application of Medications - injection 0 INTERVENTIONS - Miscellaneous []  - External ear exam 0 []  - Specimen Collection (cultures, biopsies, blood, body fluids, etc.) 0 []  - Specimen(s) / Culture(s) sent or taken to Lab for analysis 0 []  - Patient Transfer (multiple staff / Nurse, adult / Similar devices) 0 []  - Simple Staple / Suture removal (25 or less) 0 []  - Complex Staple / Suture removal (26 or more) 0 Julie Graham, Julie R. ([id number]) []  - Hypo / Hyperglycemic Management (close monitor of Blood Glucose) 0 []  - Ankle / Brachial Index (ABI) - do not check if billed separately 0 Has the patient been seen at the hospital within the last three years: Yes Total Score: 165 Level Of Care: New/Established - Level 5 Electronic Signature(s) Signed: 09/27/2016 9:53:50 AM By: Elliot Gurney, RN, BSN, Kim RN, BSN Entered By: Elliot Gurney, RN, BSN, Kim on 09/23/2016 17:52:02 Julie Graham, Julie Graham ([id number]) -------------------------------------------------------------------------------- Encounter Discharge Information Details Patient Name: Julie Graham. Date of Service: 09/23/2016 2:30 PM Medical Record Number: [id number] Patient Account Number: [id number] Date of Birth/Sex: 1966-05-30 (50 y.o. Female) Treating RN: Huel Coventry Primary Care Physician: Birdie Sons, ERIC Other Clinician: Referring  Physician: Phineas Semen Treating Physician/Extender: Rudene Re in Treatment: 0 Encounter Discharge Information Items Schedule Follow-up Appointment: No Medication Reconciliation completed and provided to Patient/Care No Julie Graham: Provided on Clinical Summary of Care: 09/23/2016 Form Type Recipient Paper Patient AL Electronic Signature(s) Signed: 09/23/2016 3:52:51 PM By: Gwenlyn Perking Entered By: Gwenlyn Perking on 09/23/2016 15:52:51 Julie Graham, Julie R. ([id number]) -------------------------------------------------------------------------------- Lower Extremity Assessment Details Patient Name: Julie Graham. Date of Service: 09/23/2016 2:30 PM Medical Record Number: [id number] Patient Account Number: [id number] Date of Birth/Sex: [birth year] (50 y.o. Female) Treating RN: Huel Coventry Primary Care Physician: Birdie Sons, ERIC Other Clinician: Referring Physician: Phineas Semen Treating Physician/Extender: Rudene Re in Treatment: 0 Vascular Assessment Claudication: Claudication Assessment [Left:None] [Right:None] Pulses: Dorsalis Pedis Palpable: [Left:Yes] [Right:Yes] Doppler Audible: [Left:Yes] [Right:Yes] Posterior Tibial Palpable: [Left:Yes] [Right:Yes] Doppler Audible: [Left:Yes] [Right:Yes] Extremity colors, hair growth, and conditions: Extremity Color: [Left:Mottled] [Right:Mottled] Hair Growth on Extremity: [Left:Yes] [Right:Yes] Temperature of Extremity: [Left:Warm] [Right:Warm] Capillary Refill: [Left:< 3 seconds] [Right:< 3 seconds] Dependent Rubor: [Left:No] [Right:No] Blanched when Elevated: [Left:No] [Right:No] Lipodermatosclerosis: [Left:No] [Right:No] Toe Nail Assessment Left: Right: Thick: No No Discolored: No No Deformed: No No Improper Length and Hygiene: No No Notes Patient has reddened, swollen legs that are painful. No ABI at this time Electronic Signature(s) Signed: 09/23/2016 5:31:46 PM By: Elliot Gurney, RN, BSN, Kim RN, BSN Entered  By: Elliot Gurney, RN, BSN, Kim on 09/23/2016 15:02:41 Julie Graham, Julie Graham ([id number]) -------------------------------------------------------------------------------- Multi Wound Chart Details Patient Name: Julie Graham. Date of Service: 09/23/2016 2:30 PM Medical Record Number: [id number] Patient Account Number: [id number] Date of Birth/Sex: [birth year] (50 y.o. Female) Treating RN: Huel Coventry Primary Care Physician: Birdie Sons, ERIC Other Clinician: Referring Physician: Phineas Semen Treating Physician/Extender: Rudene Re in Treatment: 0 Vital Signs Height(in): 65 Pulse(bpm): 88 Weight(lbs): 218 Blood Pressure 106/34 (mmHg): Body Mass Index(BMI): 36 Temperature(F): 97.7 Respiratory Rate 16 (breaths/min): Photos: Wound Location: Right Lower Leg - Medial Right Lower Leg - Medial, Right  Lower Leg - Medial, Distal Proximal Wounding Event: Gradually Appeared Gradually Appeared Gradually Appeared Primary Etiology: Infection - not elsewhere Infection - not elsewhere Infection - not elsewhere classified classified classified Date Acquired: 08/26/2016 08/26/2016 09/02/2016 Weeks of Treatment: 0 0 0 Wound Status: Open Open Open Measurements L x W x D 3.4x4x1 1.8x2x0.2 1.3x1.5x0.1 (cm) Area (cm) : 10.681 2.827 1.532 Volume (cm) : 10.681 0.565 0.153 Classification: Full Thickness Without Full Thickness Without Full Thickness Without Exposed Support Exposed Support Exposed Support Structures Structures Structures Exudate Amount: Large Medium Small Exudate Type: Serosanguineous Serous Serous Exudate Color: red, brown amber amber Wound Margin: Thickened Flat and Intact Flat and Intact Granulation Amount: None Present (0%) None Present (0%) None Present (0%) Necrotic Amount: Large (67-100%) Large (67-100%) Large (67-100%) Necrotic Tissue: Eschar, Adherent Slough Eschar, Adherent Slough Eschar Exposed Structures: Fat: Yes Fat: Yes Fat: Yes Fascia: No Fascia: No Fascia:  No Ernster, Sevannah R. ([id number]) Tendon: No Tendon: No Tendon: No Muscle: No Muscle: No Muscle: No Joint: No Joint: No Joint: No Bone: No Bone: No Bone: No Epithelialization: None None None Periwound Skin Texture: Edema: No No Abnormalities Noted Edema: No Excoriation: No Excoriation: No Induration: No Induration: No Callus: No Callus: No Crepitus: No Crepitus: No Fluctuance: No Fluctuance: No Friable: No Friable: No Rash: No Rash: No Scarring: No Scarring: No Periwound Skin Moist: Yes No Abnormalities Noted Maceration: No Moisture: Maceration: No Moist: No Dry/Scaly: No Dry/Scaly: No Periwound Skin Color: Erythema: Yes Ecchymosis: Yes Ecchymosis: Yes Atrophie Blanche: No Erythema: Yes Erythema: Yes Cyanosis: No Atrophie Blanche: No Ecchymosis: No Cyanosis: No Hemosiderin Staining: No Hemosiderin Staining: No Mottled: No Mottled: No Pallor: No Pallor: No Rubor: No Rubor: No Erythema Location: Circumferential N/A N/A Temperature: N/A No Abnormality No Abnormality Tenderness on No No Yes Palpation: Wound Preparation: Ulcer Cleansing: Ulcer Cleansing: Ulcer Cleansing: Rinsed/Irrigated with Rinsed/Irrigated with Rinsed/Irrigated with Saline Saline Saline Topical Anesthetic Topical Anesthetic Topical Anesthetic Applied: Other: lidocaine Applied: Other: lidocaine Applied: Other: lidocaine 4% 4% 4% Treatment Notes Electronic Signature(s) Signed: 09/23/2016 4:06:44 PM By: Evlyn KannerBritto, Errol MD, FACS Entered By: Evlyn KannerBritto, Errol on 09/23/2016 16:06:44 Popper, Shellie Elvera Graham. ([id number]) -------------------------------------------------------------------------------- Multi-Disciplinary Care Plan Details Patient Name: Julie DaisyLLOYD, Julie R. Date of Service: 09/23/2016 2:30 PM Medical Record Number: [id number] Patient Account Number: [id number] Date of Birth/Sex: Oct 19, 1965 (50 y.o. Female) Treating RN: Huel CoventryWoody, Kim Primary Care Physician: Birdie SonsSONNENBERG, ERIC Other  Clinician: Referring Physician: Phineas SemenGOODMAN, GRAYDON Treating Physician/Extender: Rudene ReBritto, Errol Weeks in Treatment: 0 Active Inactive Electronic Signature(s) Signed: 09/29/2016 4:23:41 PM By: Elliot GurneyWoody, RN, BSN, Kim RN, BSN Previous Signature: 09/23/2016 5:31:46 PM Version By: Elliot GurneyWoody, RN, BSN, Kim RN, BSN Entered By: Elliot GurneyWoody, RN, BSN, Kim on 09/28/2016 17:52:59 Rotunno, Tykeria Elvera Graham. ([id number]) -------------------------------------------------------------------------------- Pain Assessment Details Patient Name: Julie DaisyLLOYD, Julie R. Date of Service: 09/23/2016 2:30 PM Medical Record Number: [id number] Patient Account Number: [id number] Date of Birth/Sex: Oct 19, 1965 (50 y.o. Female) Treating RN: Huel CoventryWoody, Kim Primary Care Physician: Birdie SonsSONNENBERG, ERIC Other Clinician: Referring Physician: Phineas SemenGOODMAN, GRAYDON Treating Physician/Extender: Rudene ReBritto, Errol Weeks in Treatment: 0 Active Problems Location of Pain Severity and Description of Pain Patient Has Paino Yes Site Locations Pain Location: Generalized Pain, Pain in Ulcers With Dressing Change: No Rate the pain. Current Pain Level: 3 Worst Pain Level: 9 Character of Pain Describe the Pain: Burning, Shooting Pain Management and Medication Current Pain Management: Goals for Pain Management Topical or injectable lidocaine is offered to patient for acute pain when surgical debridement is performed. If needed, Patient is instructed to use over the counter pain  medication for the following 24-48 hours after debridement. Wound care MDs do not prescribed pain medications. Patient has chronic pain or uncontrolled pain. Patient has been instructed to make an appointment with their Primary Care Physician for pain management. Electronic Signature(s) Signed: 09/23/2016 5:31:46 PM By: Elliot GurneyWoody, RN, BSN, Kim RN, BSN Entered By: Elliot GurneyWoody, RN, BSN, Kim on 09/23/2016 15:00:59 Julie DaisyLLOYD, Arieana R.  ([id number]) -------------------------------------------------------------------------------- Wound Assessment Details Patient Name: Julie DaisyLLOYD, Julie R. Date of Service: 09/23/2016 2:30 PM Medical Record Number: [id number] Patient Account Number: [id number] Date of Birth/Sex: November 05, 1965 (50 y.o. Female) Treating RN: Huel CoventryWoody, Kim Primary Care Physician: Birdie SonsSONNENBERG, ERIC Other Clinician: Referring Physician: Phineas SemenGOODMAN, GRAYDON Treating Physician/Extender: Rudene ReBritto, Errol Weeks in Treatment: 0 Wound Status Wound Number: 1 Primary Etiology: Infection - not elsewhere classified Wound Location: Right Lower Leg - Medial Wound Status: Open Wounding Event: Gradually Appeared Date Acquired: 08/26/2016 Weeks Of Treatment: 0 Clustered Wound: No Photos Wound Measurements Length: (cm) 3.4 Width: (cm) 4 Depth: (cm) 1 Area: (cm) 10.681 Volume: (cm) 10.681 % Reduction in Area: % Reduction in Volume: Epithelialization: None Wound Description Full Thickness Without Exposed Foul Odor Af Classification: Support Structures Wound Margin: Thickened Exudate Large Amount: Exudate Type: Serosanguineous Exudate Color: red, brown ter Cleansing: No Wound Bed Granulation Amount: None Present (0%) Exposed Structure Necrotic Amount: Large (67-100%) Fascia Exposed: No Necrotic Quality: Eschar, Adherent Slough Fat Layer Exposed: Yes Tendon Exposed: No Muscle Exposed: No Joint Exposed: No Schreck, Kiaja R. ([id number]) Bone Exposed: No Periwound Skin Texture Texture Color No Abnormalities Noted: No No Abnormalities Noted: No Callus: No Atrophie Blanche: No Crepitus: No Cyanosis: No Excoriation: No Ecchymosis: No Fluctuance: No Erythema: Yes Friable: No Erythema Location: Circumferential Induration: No Hemosiderin Staining: No Localized Edema: No Mottled: No Rash: No Pallor: No Scarring: No Rubor: No Moisture No Abnormalities Noted: No Dry / Scaly: No Maceration: No Moist: Yes Wound  Preparation Ulcer Cleansing: Rinsed/Irrigated with Saline Topical Anesthetic Applied: Other: lidocaine 4%, Electronic Signature(s) Signed: 09/23/2016 5:31:46 PM By: Elliot GurneyWoody, RN, BSN, Kim RN, BSN Entered By: Elliot GurneyWoody, RN, BSN, Kim on 09/23/2016 14:52:03 Julie Graham, Kynnedi Elvera Graham. ([id number]) -------------------------------------------------------------------------------- Wound Assessment Details Patient Name: Julie DaisyLLOYD, Hagan R. Date of Service: 09/23/2016 2:30 PM Medical Record Number: [id number] Patient Account Number: [id number] Date of Birth/Sex: November 05, 1965 (50 y.o. Female) Treating RN: Huel CoventryWoody, Kim Primary Care Physician: Birdie SonsSONNENBERG, ERIC Other Clinician: Referring Physician: Phineas SemenGOODMAN, GRAYDON Treating Physician/Extender: Rudene ReBritto, Errol Weeks in Treatment: 0 Wound Status Wound Number: 2 Primary Etiology: Infection - not elsewhere classified Wound Location: Right Lower Leg - Medial, Wound Status: Open Distal Wounding Event: Gradually Appeared Date Acquired: 08/26/2016 Weeks Of Treatment: 0 Clustered Wound: No Photos Wound Measurements Length: (cm) 1.8 Width: (cm) 2 Depth: (cm) 0.2 Area: (cm) 2.827 Volume: (cm) 0.565 % Reduction in Area: % Reduction in Volume: Epithelialization: None Wound Description Full Thickness Without Exposed Classification: Support Structures Wound Margin: Flat and Intact Exudate Medium Amount: Exudate Type: Serous Exudate Color: amber Wound Bed Granulation Amount: None Present (0%) Exposed Structure Necrotic Amount: Large (67-100%) Fascia Exposed: No Necrotic Quality: Eschar, Adherent Slough Fat Layer Exposed: Yes Tendon Exposed: No Muscle Exposed: No Knarr, Camika R. ([id number]) Joint Exposed: No Bone Exposed: No Periwound Skin Texture Texture Color No Abnormalities Noted: No No Abnormalities Noted: No Ecchymosis: Yes Moisture Erythema: Yes No Abnormalities Noted: No Temperature / Pain Temperature: No Abnormality Wound Preparation Ulcer  Cleansing: Rinsed/Irrigated with Saline Topical Anesthetic Applied: Other: lidocaine 4%, Electronic Signature(s) Signed: 09/23/2016 5:31:46 PM By: Elliot GurneyWoody, RN, BSN, Kim RN, BSN Entered By: Elliot GurneyWoody, RN, BSN,  Kim on 09/23/2016 14:54:02 Limbert, Meliah Elvera Graham ([id number]) -------------------------------------------------------------------------------- Wound Assessment Details Patient Name: AURIANA, SCALIA. Date of Service: 09/23/2016 2:30 PM Medical Record Number: [id number] Patient Account Number: [id number] Date of Birth/Sex: 1966/06/13 (50 y.o. Female) Treating RN: Huel Coventry Primary Care Physician: Birdie Sons, ERIC Other Clinician: Referring Physician: Phineas Semen Treating Physician/Extender: Rudene Re in Treatment: 0 Wound Status Wound Number: 3 Primary Etiology: Infection - not elsewhere classified Wound Location: Right Lower Leg - Medial, Wound Status: Open Proximal Wounding Event: Gradually Appeared Date Acquired: 09/02/2016 Weeks Of Treatment: 0 Clustered Wound: No Photos Wound Measurements Length: (cm) 1.3 Width: (cm) 1.5 Depth: (cm) 0.1 Area: (cm) 1.532 Volume: (cm) 0.153 % Reduction in Area: % Reduction in Volume: Epithelialization: None Tunneling: No Undermining: No Wound Description Full Thickness Without Exposed Classification: Support Structures Wound Margin: Flat and Intact Exudate Small Amount: Exudate Type: Serous Exudate Color: amber Wound Bed Granulation Amount: None Present (0%) Exposed Structure Necrotic Amount: Large (67-100%) Fascia Exposed: No Necrotic Quality: Eschar Fat Layer Exposed: Yes Tendon Exposed: No Muscle Exposed: No Bontempo, Raneshia R. ([id number]) Joint Exposed: No Bone Exposed: No Periwound Skin Texture Texture Color No Abnormalities Noted: No No Abnormalities Noted: No Callus: No Atrophie Blanche: No Crepitus: No Cyanosis: No Excoriation: No Ecchymosis: Yes Fluctuance: No Erythema: Yes Friable: No Hemosiderin  Staining: No Induration: No Mottled: No Localized Edema: No Pallor: No Rash: No Rubor: No Scarring: No Temperature / Pain Moisture Temperature: No Abnormality No Abnormalities Noted: No Tenderness on Palpation: Yes Dry / Scaly: No Maceration: No Moist: No Wound Preparation Ulcer Cleansing: Rinsed/Irrigated with Saline Topical Anesthetic Applied: Other: lidocaine 4%, Electronic Signature(s) Signed: 09/23/2016 5:31:46 PM By: Elliot Gurney, RN, BSN, Kim RN, BSN Entered By: Elliot Gurney, RN, BSN, Kim on 09/23/2016 14:56:35 Gover, Korrine Elvera Graham ([id number]) -------------------------------------------------------------------------------- Vitals Details Patient Name: Julie Graham. Date of Service: 09/23/2016 2:30 PM Medical Record Number: [id number] Patient Account Number: [id number] Date of Birth/Sex: [birth year] (50 y.o. Female) Treating RN: Huel Coventry Primary Care Physician: Birdie Sons, ERIC Other Clinician: Referring Physician: Phineas Semen Treating Physician/Extender: Rudene Re in Treatment: 0 Vital Signs Time Taken: 14:39 Temperature (F): 97.7 Height (in): 65 Pulse (bpm): 88 Source: Stated Respiratory Rate (breaths/min): 16 Weight (lbs): 218 Blood Pressure (mmHg): 106/34 Source: Stated Reference Range: 80 - 120 mg / dl Body Mass Index (BMI): 36.3 Electronic Signature(s) Signed: 09/23/2016 5:31:46 PM By: Elliot Gurney, RN, BSN, Kim RN, BSN Entered By: Elliot Gurney, RN, BSN, Kim on 09/23/2016 15:01:04

## 2016-09-26 IMAGING — US US PARACENTESIS
1 series · 6 of 6 positions shown · non-contrast
Comparison: none

INDICATION: History of alcoholic hepatitis and cirrhosis. Ascites. Request for
diagnostic and therapeutic paracentesis.

[Series 1: us paracentesis · 0.31mm/px · 6 of 6 slices shown]
[im 1/6]
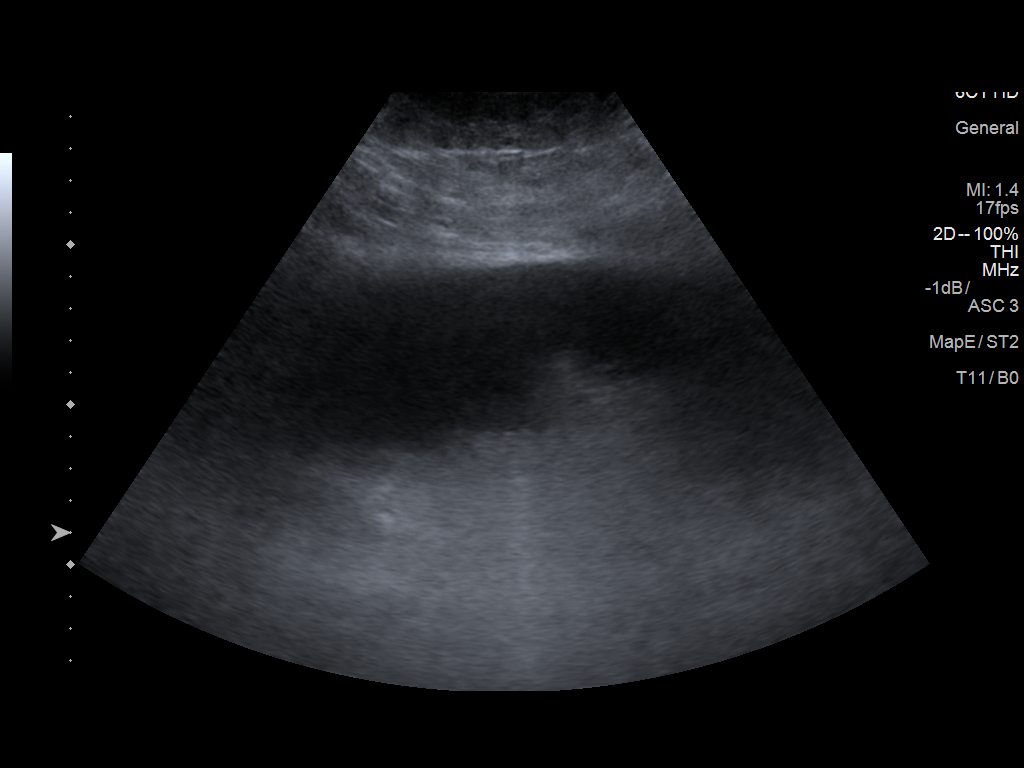
[im 2/6]
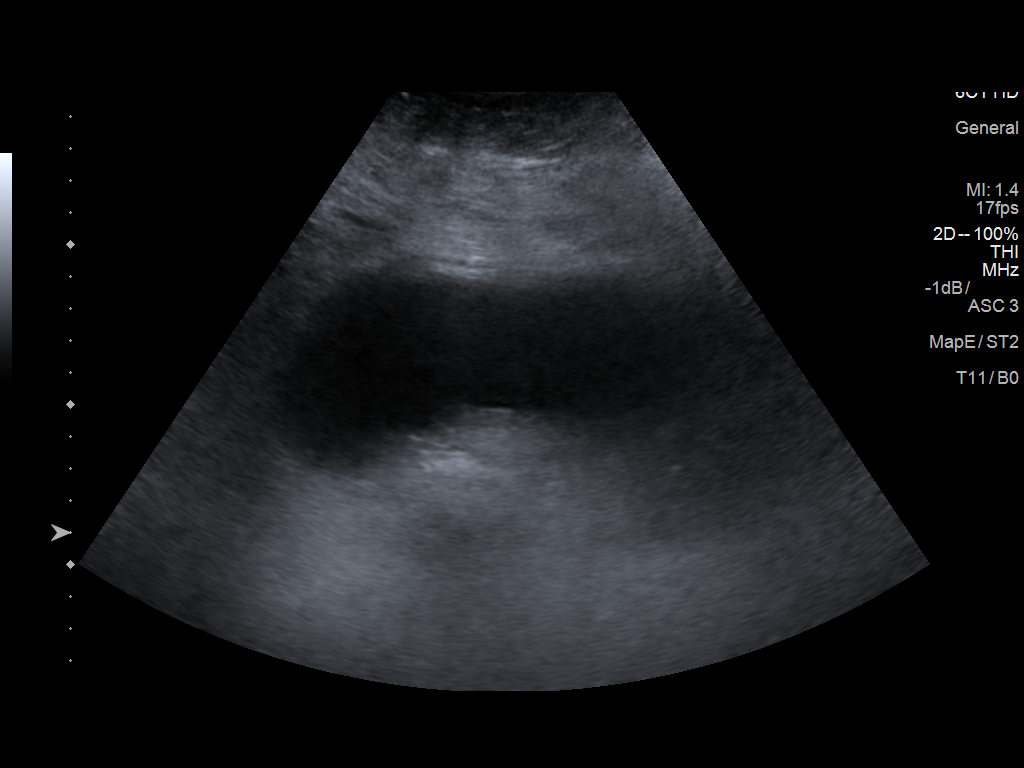
[im 3/6]
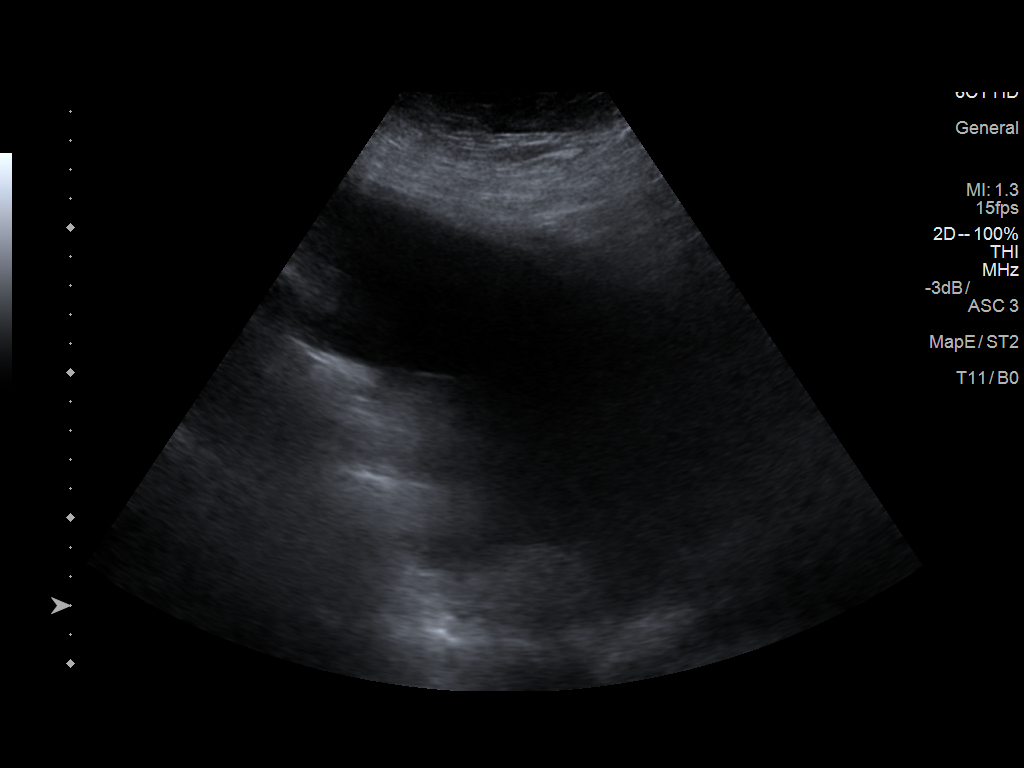
[im 4/6]
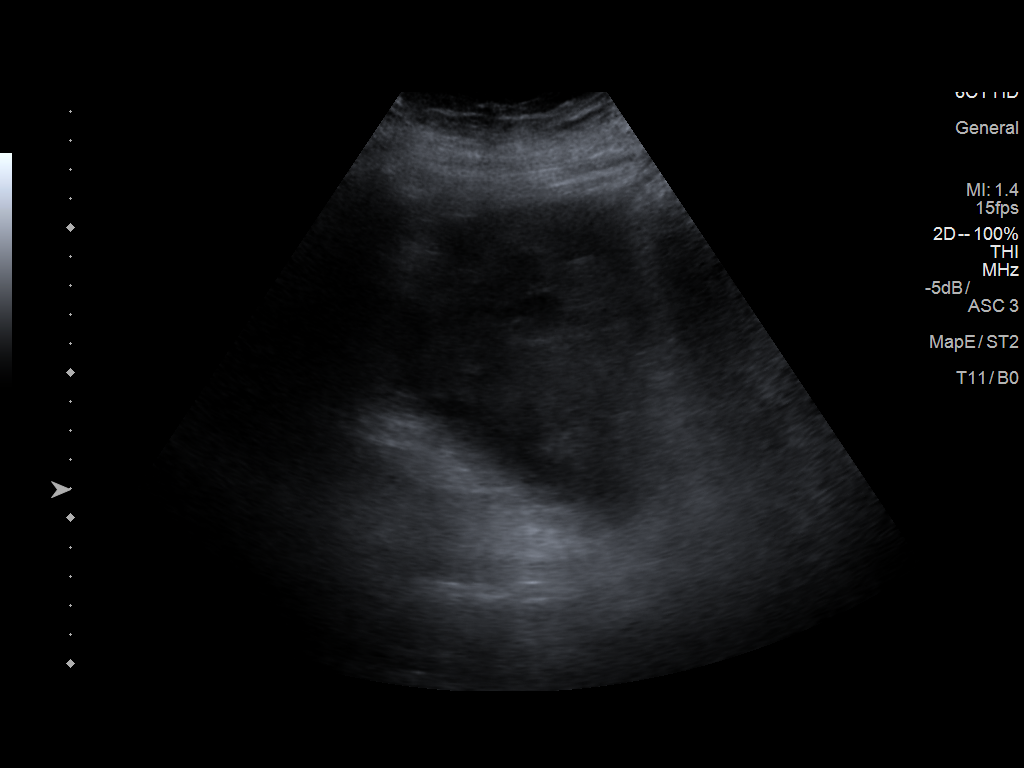
[im 5/6]
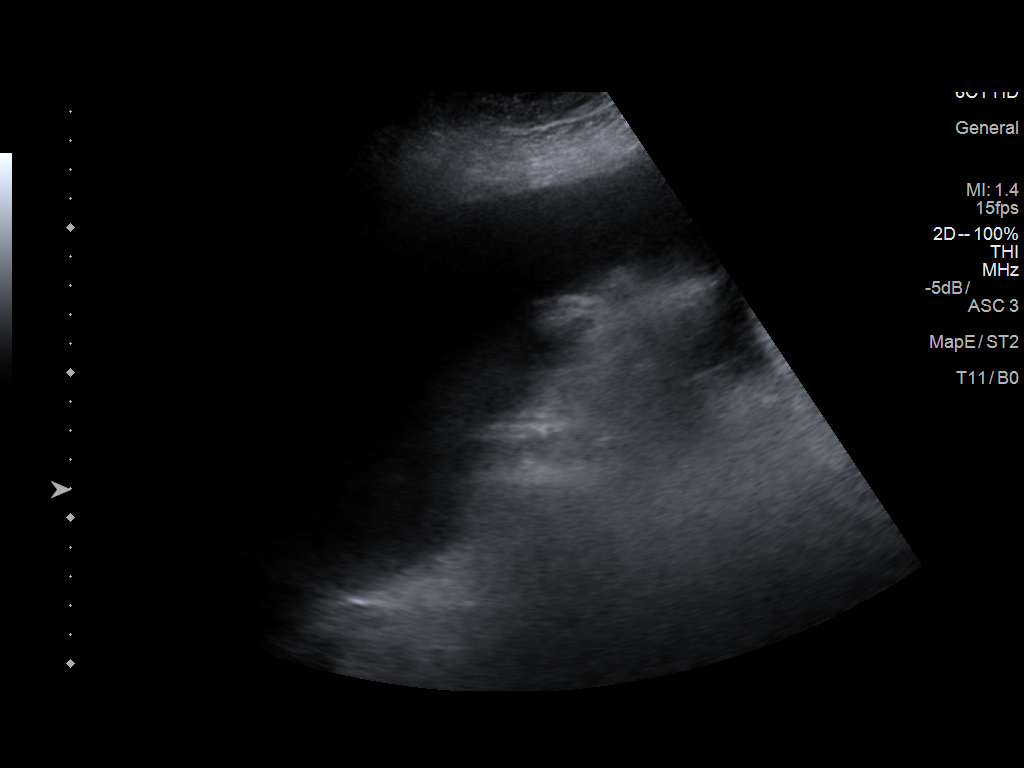
[im 6/6]
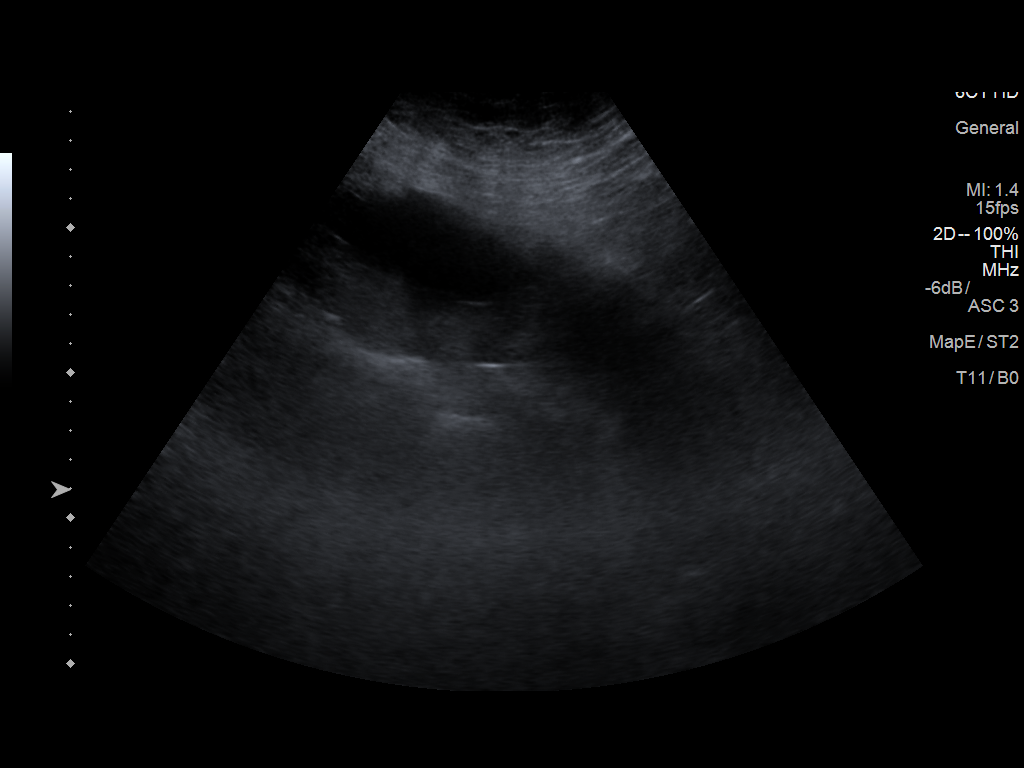

[6 of 6 positions shown; findings below may reference images not displayed]

EXAM:
ULTRASOUND GUIDED LEFT LATERAL ABDOMEN PARACENTESIS

MEDICATIONS:
1% Lidocaine.

COMPLICATIONS:
None immediate.

PROCEDURE:
Informed written consent was obtained from the patient after a
discussion of the risks, benefits and alternatives to treatment. A
timeout was performed prior to the initiation of the procedure.

Initial ultrasound scanning demonstrates a moderate amount of
ascites within the left lower abdominal quadrant. The left lower
abdomen was prepped and draped in the usual sterile fashion. 1%
lidocaine with epinephrine was used for local anesthesia.

Following this, a 19 gauge, 10-cm, Yueh catheter was introduced. An
ultrasound image was saved for documentation purposes. The
paracentesis was performed. The catheter was removed and a dressing
was applied. The patient tolerated the procedure well without
immediate post procedural complication.
FINDINGS: A total of approximately 4 liters of clear yellow fluid was removed.
Samples were sent to the laboratory as requested by the clinical
team.
IMPRESSION: Successful ultrasound-guided paracentesis yielding 4 liters of
peritoneal fluid.

## 2016-09-28 ENCOUNTER — Encounter: Payer: Self-pay | Admitting: Family Medicine

## 2016-09-28 ENCOUNTER — Ambulatory Visit (INDEPENDENT_AMBULATORY_CARE_PROVIDER_SITE_OTHER): Payer: 59 | Admitting: Family Medicine

## 2016-09-28 VITALS — BP 112/60 | HR 91 | Temp 97.8°F | Wt 224.0 lb

## 2016-09-28 DIAGNOSIS — K7031 Alcoholic cirrhosis of liver with ascites: Secondary | ICD-10-CM

## 2016-09-28 DIAGNOSIS — L98492 Non-pressure chronic ulcer of skin of other sites with fat layer exposed: Secondary | ICD-10-CM

## 2016-09-28 MED ORDER — CLINDAMYCIN HCL 300 MG PO CAPS: 300.0000 mg | ORAL_CAPSULE | Freq: Three times a day (TID) | ORAL | 0 refills | Status: DC

## 2016-09-28 MED ORDER — CLINDAMYCIN HCL 300 MG PO CAPS
300.0000 mg | ORAL_CAPSULE | Freq: Three times a day (TID) | ORAL | 0 refills | Status: DC
Start: 2016-09-28 — End: 2017-07-18

## 2016-09-28 NOTE — Patient Instructions (Signed)
Nice to see you. We are going to start you on clindamycin for your wounds as there is some purulent drainage. [facility] Please go back to following the sodium restricted diet and taking your fluid pills. Please monitor your breathing and if you have trouble breathing or wheezing please seek medical attention. If you develop spreading redness, increased drainage, increased pain, or any new symptoms with your ulcerations please seek medical attention immediately.

## 2016-09-28 NOTE — Progress Notes (Signed)
Pre visit review using our clinic review tool, if applicable. No additional management support is needed unless otherwise documented below in the visit note. 

## 2016-09-29 DIAGNOSIS — L98499 Non-pressure chronic ulcer of skin of other sites with unspecified severity: Secondary | ICD-10-CM | POA: Insufficient documentation

## 2016-09-29 NOTE — Progress Notes (Signed)
Julie Alar, MD Phone: [phone]  LALONNIE SHAFFER is a 51 y.o. female who presents today for follow-up.  Lower extremity wounds: Saw wound care at [facility]. Notes they debrided the 2 smaller wounds on the medial aspect of her calf. They were unable to be a to debride the anterior wound given the discomfort. Notes that she will likely need to be under anesthesia to have that debrided. Notes the medial aspect lesions have developed a smell and surrounding erythema. Notes the anterior lesion appears somewhat improved. Taking tramadol and gabapentin for this. Reports they recommended that she be evaluated through wound care at [facility].  Cirrhosis: Patient is not drinking any alcohol. She had been doing a fairly good job following a sodium restricted diet. Got away from this over the holidays. Also got away from taking her Lasix and Aldactone consistently for a few days. Weight has gone up about 6 pounds in the last week. Has been breathing well. Feels much less swollen than she was previously. No wheezing. No shortness of breath. She is starting to see a therapist as well through [facility] as part of her being on the transplant list.  PMH: Former alcohol abuser   ROS see history of present illness  Objective  Physical Exam Vitals:   09/28/16 0949  BP: 112/60  Pulse: 91  Temp: 97.8 F (36.6 C)    BP Readings from Last 3 Encounters:  09/28/16 112/60  09/06/16 (!) 141/68  08/23/16 120/68   Wt Readings from Last 3 Encounters:  09/28/16 224 lb (101.6 kg)  08/23/16 241 lb 9.6 oz (109.6 kg)  07/22/16 244 lb 3.2 oz (110.8 kg)    Physical Exam  Constitutional: No distress.  Cardiovascular: Normal rate, regular rhythm and normal heart sounds.   Pulmonary/Chest: Effort normal. No respiratory distress. She has no wheezes. She has no rales.  Mild decreased breath sounds right lower third of her lung, otherwise normal breath sounds bilaterally  Abdominal: Soft. Bowel sounds are normal. She exhibits  distension (minimally distended compared to previously). There is no tenderness. There is no rebound and no guarding.  Musculoskeletal: She exhibits no edema.  Neurological: She is alert. Gait normal.  Skin: Skin is warm and dry. She is not diaphoretic.        Assessment/Plan: Please see individual problem list.  Alcoholic cirrhosis (HCC) Overall doing quite a bit better. Less distended in her abdomen and less fluid accumulation. She has put 6 pounds on though has not been overly compliant with her sodium restricted diet and diuretics. Discussed the importance of limiting her sodium intake and complying with her diuretics. Discussed continuing to follow-up with the therapist through the transplant clinic and with the hepatologist at [facility]. She'll continue to monitor.  Skin ulcer with fat layer exposed (HCC) Several ulcerations on her left lower extremity. They appear to have necrotic bases and some purulent discharge. One of the smaller ulcerations does have surrounding erythema that is new. Concern would be for possible infection of the ulcerations. Discussed this with the patient and she is agreeable to trial clindamycin as this is beneficial previously. She will eat yogurt with this. Given that the wound center here recommended evaluation at [facility] we will place a referral. If she can get in in the next week she will see them for follow-up. If not she'll follow-up with me in a week to recheck her wounds. She is given return precautions.   Orders Placed This Encounter  Procedures  . AMB referral to wound  care center    Referral Priority:   Routine    Referral Type:   Consultation    Number of Visits Requested:   1    Meds ordered this encounter  Medications  . DISCONTD: clindamycin (CLEOCIN) 300 MG capsule    Sig: Take 1 capsule (300 mg total) by mouth 3 (three) times daily.    Dispense:  21 capsule    Refill:  0  . clindamycin (CLEOCIN) 300 MG capsule    Sig: Take 1 capsule (300 mg  total) by mouth 3 (three) times daily.    Dispense:  21 capsule    Refill:  0    Julie AlarEric Yasemin Rabon, MD [facility] [place]

## 2016-09-29 NOTE — Assessment & Plan Note (Signed)
Several ulcerations on her left lower extremity. They appear to have necrotic bases and some purulent discharge. One of the smaller ulcerations does have surrounding erythema that is new. Concern would be for possible infection of the ulcerations. Discussed this with the patient and she is agreeable to trial clindamycin as this is beneficial previously. She will eat yogurt with this. Given that the wound center here [facility] we will place a referral. If she can get in in the next week she will see them for follow-up. If not she'll follow-up with me in a week to recheck her wounds. She is given return precautions.

## 2016-09-29 NOTE — Assessment & Plan Note (Signed)
Overall doing quite a bit better. Less distended in her abdomen and less fluid accumulation. She has put 6 pounds on though has not been overly compliant with her sodium restricted diet and diuretics. Discussed the importance of limiting her sodium intake and complying with her diuretics. Discussed continuing to follow-up with the therapist through the transplant clinic and [facility]. She'll continue to monitor.

## 2016-10-05 ENCOUNTER — Telehealth: Payer: Self-pay | Admitting: *Deleted

## 2016-10-05 NOTE — Telephone Encounter (Signed)
Patient needs follow-up for this. She was supposed to follow-up in one week for re-evaluation if she was unable to get in to see wound care. Please set her up for follow-up. Thanks.

## 2016-10-05 NOTE — Telephone Encounter (Signed)
Please advise 

## 2016-10-05 NOTE — Telephone Encounter (Signed)
Patient scheduled for appointment.

## 2016-10-05 NOTE — Telephone Encounter (Signed)
Patient has requested to have a pain medication to help with her wound pain on her right leg.  Patient took tramadol today, this medication has not helped. Pt reported that her wound has not change since seeing Dr.Sonnenberg last,however she has terrible pain at times.  Pt contact [phone]

## 2016-10-07 ENCOUNTER — Ambulatory Visit (INDEPENDENT_AMBULATORY_CARE_PROVIDER_SITE_OTHER): Payer: 59 | Admitting: Family Medicine

## 2016-10-07 ENCOUNTER — Encounter: Payer: Self-pay | Admitting: Family Medicine

## 2016-10-07 VITALS — BP 100/58 | HR 94 | Temp 98.2°F | Wt 206.0 lb

## 2016-10-07 DIAGNOSIS — R600 Localized edema: Secondary | ICD-10-CM | POA: Diagnosis not present

## 2016-10-07 DIAGNOSIS — L98492 Non-pressure chronic ulcer of skin of other sites with fat layer exposed: Secondary | ICD-10-CM

## 2016-10-07 DIAGNOSIS — Z5181 Encounter for therapeutic drug level monitoring: Secondary | ICD-10-CM

## 2016-10-07 MED ORDER — OXYCODONE HCL 5 MG PO TABS: 2.5000 mg | ORAL_TABLET | Freq: Four times a day (QID) | ORAL | 0 refills | Status: DC | PRN

## 2016-10-07 NOTE — Progress Notes (Signed)
[name], MD Phone: [phone]  Julie Graham is a 51 y.o. female who presents today for follow-up.  Patient follows up for her lower extremity wounds. Notes at times she has severe pain that does not respond as well to the tramadol. Notes the gabapentin has helped with her nerve pain. Does note a small amount of tolerable irritating pain at times as well. Last had the severe pain about a week ago. Improved some with the tramadol though took a fair amount of time after taking the tramadol to improve. She notes no spreading redness. No warmth. No purulent drainage. Some odor though it is not strong. She notes no fevers. She has serosanguineous drainage. Currently finishing up clindamycin.  Patient has been more compliant with her diuretics. Weight is down significantly. She believes she is taking 120 mg of Lasix in the morning and 80 at night. Also taking spironolactone 200 mg daily. Notes her swelling is better. No breathing issues. On 3 occasions she has gotten lightheaded on standing. Resolves quickly.  PMH: nonsmoker.   ROS see history of present illness  Objective  Physical Exam Vitals:   10/07/16 1021  BP: (!) 100/58  Pulse: 94  Temp: 98.2 F (36.8 C)   Laying blood pressure 120/70 pulse 90 Sitting blood pressure 118/64 pulse 93 Standing blood pressure 112/66 pulse 112  BP Readings from Last 3 Encounters:  10/07/16 (!) 100/58  09/28/16 112/60  09/06/16 (!) 141/68   Wt Readings from Last 3 Encounters:  10/07/16 206 lb (93.4 kg)  09/28/16 224 lb (101.6 kg)  08/23/16 241 lb 9.6 oz (109.6 kg)    Physical Exam  Constitutional: No distress.  Cardiovascular: Normal rate, regular rhythm and normal heart sounds.   Pulmonary/Chest: Effort normal. No respiratory distress. She has no wheezes. She has no rales.  Mild decreased breath sounds at right lung base though sounds elsewhere normal and improved in the right lung  Musculoskeletal: She exhibits edema (trace lower  extremity).  Neurological: She is alert. Gait normal.  Skin: She is not diaphoretic.      no crepitus surrounding the right anterior tibial ulcer   Assessment/Plan: Please see individual problem list.  Bilateral lower extremity edema Patient has done a great job with her diuretic therapy though I wonder if she is over diuresing herself given the minimal lightheadedness when she stands up at times. Her orthostatics today did not reveal any significant blood pressure changes though her heart rate did increase on standing up. Discussed decreasing morning dose of Lasix to equal the p.m. dose. Discussed monitoring fluid intake. She'll monitor her weight and swelling with this. If it starts to go up or she starts developing issues with breathing she will contact us or be evaluated. Check BMP today to evaluate renal function.  Skin ulcer with fat layer exposed (HCC) Ulcerations with no purulent discharge today. Erythema does appear somewhat improved. No induration. No crepitus. They do not appear infected at this time. She will finish her course of antibiotics. She will keep her wound care appointment at [facility] later this month. Given isolated episodes of increased pain we will provide her with a small amount of oxycodone to take for severe pain. Discussed that this could make her drowsy. She's given return precautions.   Orders Placed This Encounter  Procedures  . Basic Metabolic Panel (BMET)    Meds ordered this encounter  Medications  . oxyCODONE (ROXICODONE) 5 MG immediate release tablet    Sig: Take 0.5 tablets (2.5 mg  total) by mouth every 6 (six) hours as needed for severe pain.    Dispense:  10 tablet    Refill:  0    Marikay AlarEric Costella Schwarz, MD [facility]

## 2016-10-07 NOTE — Assessment & Plan Note (Addendum)
Patient has done a great job with her diuretic therapy though I wonder if she is over diuresing herself given the minimal lightheadedness when she stands up at times. Her orthostatics today did not reveal any significant blood pressure changes though her heart rate did increase on standing up. Discussed decreasing morning dose of Lasix to equal the p.m. dose. Discussed monitoring fluid intake. She'll monitor her weight and swelling with this. If it starts to go up or she starts developing issues with breathing she will contact us or be evaluated. Check BMP today to evaluate renal function.

## 2016-10-07 NOTE — Progress Notes (Signed)
Pre visit review using our clinic review tool, if applicable. No additional management support is needed unless otherwise documented below in the visit note. 

## 2016-10-07 NOTE — Patient Instructions (Signed)
Nice to see you. Please monitor your wounds. If you develop any spreading redness, warmth, pus drainage, fevers, or significantly worsening pain or persistent pain please seek medical attention immediately. You can try the 2.5 mg of oxycodone for severe discomfort. Please [place] [facility] [place].

## 2016-10-07 NOTE — Assessment & Plan Note (Signed)
Ulcerations with no purulent discharge today. Erythema does appear somewhat improved. No induration. No crepitus. They do not appear infected at this time. She will finish her course of antibiotics. She [facility]. Given isolated episodes of increased pain we will provide her with a small amount of oxycodone to take for severe pain. Discussed that this could make her drowsy. She's given return precautions.

## 2016-10-08 LAB — BASIC METABOLIC PANEL
BUN/Creatinine Ratio: 11 (ref 9–23)
BUN: 13 mg/dL (ref 6–24)
CALCIUM: 8.7 mg/dL (ref 8.7–10.2)
CO2: 24 mmol/L (ref 18–29)
Chloride: 93 mmol/L — ABNORMAL LOW (ref 96–106)
Creatinine, Ser: 1.14 mg/dL — ABNORMAL HIGH (ref 0.57–1.00)
GFR calc Af Amer: 66 mL/min/{1.73_m2} (ref >59)
GFR, EST NON AFRICAN AMERICAN: 57 mL/min/{1.73_m2} — AB (ref >59)
Glucose: 93 mg/dL (ref 65–99)
POTASSIUM: 4 mmol/L (ref 3.5–5.2)
Sodium: 134 mmol/L (ref 134–144)

## 2016-10-11 ENCOUNTER — Telehealth: Payer: Self-pay | Admitting: *Deleted

## 2016-10-11 NOTE — Telephone Encounter (Signed)
Pt has requested lab results  Pt contact [phone]

## 2016-10-11 NOTE — Telephone Encounter (Signed)
See other message

## 2016-10-19 ENCOUNTER — Other Ambulatory Visit: Payer: Self-pay | Admitting: Internal Medicine

## 2016-10-19 NOTE — Telephone Encounter (Signed)
I have denied spironolactone (which is to be [facility]). I have approved omeprazole twice daily as recommended by Dr Rhea BeltonPyrtle at patient's last endoscopy and have denied nexium as patient should only be on 1 PPI.

## 2016-10-19 NOTE — Telephone Encounter (Signed)
[facility] been [facility]?

## 2016-10-19 NOTE — Telephone Encounter (Signed)
Ok to refill 1 of the 2 PPIs (does not need omeprazole and esomeprazole) [facility] for management of all diuretics She is/[facility] be [facility]

## 2016-10-19 NOTE — Telephone Encounter (Signed)
Dr Rhea BeltonPyrtle, patient requests refills on omeprazole, spironolactone, and esomeprazole. Looks like she has been following with [facility] still. Are we continuing to prescribe these meds or is [facility] now in charge of them? Does she need follow up in the office with us?

## 2016-10-19 NOTE — Telephone Encounter (Signed)
Looks like they are prescribing some meds but it is hard to tell from care everywhere records. Just need to know if you are ok with continuing to fill the medications that she was getting from you or if you would [facility]. If you want to keep filling, does she need office visit or does [facility] liver visit suffice?

## 2016-11-09 DIAGNOSIS — Z6838 Body mass index (BMI) 38.0-38.9, adult: Secondary | ICD-10-CM | POA: Insufficient documentation

## 2016-12-03 ENCOUNTER — Telehealth: Payer: Self-pay | Admitting: Family Medicine

## 2016-12-03 NOTE — Telephone Encounter (Signed)
Received call from team health. Home health nurse went out to the home for wound care. Patient tachycardic, febrile, and not well appearing. She has history of cirrhosis and SBP. I advised the nurse to call the ambulance and have her transported to the hospital.

## 2016-12-19 ENCOUNTER — Ambulatory Visit: Payer: 59 | Admitting: Family Medicine

## 2017-01-31 DIAGNOSIS — L97919 Non-pressure chronic ulcer of unspecified part of right lower leg with unspecified severity: Secondary | ICD-10-CM | POA: Insufficient documentation

## 2017-02-28 ENCOUNTER — Telehealth: Payer: Self-pay | Admitting: Internal Medicine

## 2017-02-28 NOTE — Telephone Encounter (Signed)
Yes, [facility] given that she is receiving her most recent care at that institution.

## 2017-02-28 NOTE — Telephone Encounter (Signed)
Pt requesting disability letter. Last OV was in 06/2016. [facility]. Should pt contact [facility] regarding this letter? Please advise.

## 2017-03-01 ENCOUNTER — Encounter: Payer: Self-pay | Admitting: Family Medicine

## 2017-03-01 ENCOUNTER — Ambulatory Visit (INDEPENDENT_AMBULATORY_CARE_PROVIDER_SITE_OTHER): Payer: Self-pay | Admitting: Family Medicine

## 2017-03-01 ENCOUNTER — Encounter: Payer: Self-pay | Admitting: Internal Medicine

## 2017-03-01 DIAGNOSIS — F419 Anxiety disorder, unspecified: Secondary | ICD-10-CM

## 2017-03-01 DIAGNOSIS — K7031 Alcoholic cirrhosis of liver with ascites: Secondary | ICD-10-CM

## 2017-03-01 DIAGNOSIS — L98499 Non-pressure chronic ulcer of skin of other sites with unspecified severity: Secondary | ICD-10-CM

## 2017-03-01 NOTE — Assessment & Plan Note (Signed)
Seems to be doing quite a bit better than the last time I saw her. Her weight is down quite a bit. Abdomen appears less distended and less fluid accumulation. Discussed complying with her sodium restricted diet and diuretics. Discussed contacting [facility] to determine if they felt she needed to be evaluated for potential paracentesis. No signs of SBP. Oxygenation is in the normal range. Does not appear to have any issues breathing. Advised that if she develops any issues breathing that she needs to be evaluated for potential thoracentesis.

## 2017-03-01 NOTE — Telephone Encounter (Signed)
Letter written and routed to you.

## 2017-03-01 NOTE — Assessment & Plan Note (Deleted)
Some intermittent anxiety. She'll continue to monitor. If worsen she can let us know.

## 2017-03-01 NOTE — Progress Notes (Signed)
[name], MD Phone: [phone]  Julie Graham is a 52 y.o. female who presents today for follow-up.  Patient has been followed by [facility] for her alcoholic cirrhosis. She's undergone multiple thoracenteses and also paracenteses for fluid accumulation. She's been trying a lot harder to keep up with the low-sodium diet and taking her Lasix and spironolactone. She notes she had a thoracentesis a little over a week ago and notes her breathing has improved quite a bit since then. She does note she feels as though she is retaining a little more fluid in her abdomen though it's gotten better with consistent use of Lasix and spironolactone. She notes intermittent cough that is nonproductive. Does get winded if she is overly active. She notes no abdominal pain or fevers. She has a call in to [facility] to see if they can get her in for a paracentesis evaluation.  Bilateral lower extremity ulcers have been improving. Still present. Notes she was on an antibiotic for a lesion on her left leg that came up while she was hospitalized. The ones on her right legs have been improving. They're no longer red. She missed her last appointment with wound care.  Patient notes her anxiety is better than prior. They did try to start her on medication though she has not been taking this. Just is intermittently anxious.  PMH: nonsmoker.   ROS see history of present illness  Objective  Physical Exam Vitals:   03/01/17 1404  BP: 120/82  Pulse: 92  Temp: 98.4 F (36.9 C)    BP Readings from Last 3 Encounters:  03/01/17 120/82  10/07/16 (!) 100/58  09/28/16 112/60   Wt Readings from Last 3 Encounters:  03/01/17 198 lb 6.4 oz (90 kg)  10/07/16 206 lb (93.4 kg)  09/28/16 224 lb (101.6 kg)    Physical Exam  Constitutional: No distress.  Cardiovascular: Normal rate and normal heart sounds.   Pulmonary/Chest: Effort normal. No respiratory distress. She has no wheezes. She has no rales.  Slightly decreased  breath sounds right lower half of her lung field, otherwise normal lung sounds  Abdominal: Soft. Bowel sounds are normal. She exhibits distension (mild). There is no tenderness. There is no rebound and no guarding.  Musculoskeletal: She exhibits edema ( trace lower extremity).  Neurological: She is alert. Gait normal.  Skin: Skin is warm and dry. She is not diaphoretic.  2 anterior ulcerations on her right distal lower extremity appear much improved from prior and are smaller in size, there is no surrounding erythema, minimal tenderness surrounding them, left lower extremity ulceration is new from her last evaluation, there is no surrounding erythema, there is minimal tenderness surrounding it, no induration surrounding any of the ulcerations     Assessment/Plan: Please see individual problem list.  Alcoholic cirrhosis (HCC) Seems to be doing quite a bit better than the last time I saw her. Her weight is down quite a bit. Abdomen appears less distended and less fluid accumulation. Discussed complying with her sodium restricted diet and diuretics. Discussed contacting [facility] to determine if they felt she needed to be evaluated for potential paracentesis. No signs of SBP. Oxygenation is in the normal range. Does not appear to have any issues breathing. Advised that if she develops any issues breathing that she needs to be evaluated for potential thoracentesis.  Anxiety Some intermittent anxiety. She'll continue to monitor. If worsen she can let us know.  Skin ulcer (HCC) Appears to be well-healing. Encouraged follow-up with wound care. Monitor  for signs of infection.  Marikay AlarEric Sonnenberg, MD [facility] [place]

## 2017-03-01 NOTE — Assessment & Plan Note (Signed)
Appears to be well-healing. Encouraged follow-up with wound care. Monitor for signs of infection.

## 2017-03-01 NOTE — Assessment & Plan Note (Signed)
Some intermittent anxiety. She'll continue to monitor. If worsen she can let us know. 

## 2017-03-01 NOTE — Telephone Encounter (Signed)
Letter mailed to pt.  

## 2017-03-01 NOTE — Telephone Encounter (Signed)
Spoke with pt and let her know that she needed to contact [facility] regarding disability letter. States she has but that she still needs a letter from Dr. Rhea BeltonPyrtle for the time that he took care of her and her status and concerns.

## 2017-03-01 NOTE — Patient Instructions (Addendum)
Nice to see you. Leger ulcers are doing better. Please contact your wound care team to set up follow-up. Please [facility] regarding potential paracentesis or thoracentesis. If your breathing worsens please seek medical attention. If you develop abdominal pain or fevers please seek medical attention. Please continue your Lasix and spironolactone.

## 2017-03-02 ENCOUNTER — Telehealth: Payer: Self-pay | Admitting: Family Medicine

## 2017-03-02 NOTE — Telephone Encounter (Signed)
Please advise 

## 2017-03-02 NOTE — Telephone Encounter (Signed)
Pt called and stated that she forgot to mention yesterday that she is taking a lot of diuretics and states that she is having a lot of cramping and spasms. Pt does take potassium, she was wondering if there was anything as far as medication that could help. Please advise, thank you!  Call pt @ [phone]

## 2017-03-03 NOTE — Telephone Encounter (Signed)
We can check lab work to evaluate her potassium and sodium. I wouldn't prescribe any medications for this. Occasionally people find that a small amount of yellow mustard is beneficial though I would prefer to see what her lab work revealed first. Thanks.

## 2017-03-06 NOTE — Telephone Encounter (Signed)
Left message to return call 

## 2017-03-06 NOTE — Telephone Encounter (Signed)
Patient notified and states she has tried yellow mustard. Patient states she is going to the liver clinic tomorrow and will have her check these labs. Patient states you were going to write a letter for her disability?

## 2017-03-07 DIAGNOSIS — D649 Anemia, unspecified: Secondary | ICD-10-CM | POA: Insufficient documentation

## 2017-03-15 NOTE — Telephone Encounter (Signed)
Left message to call back, unsure if she would like us to mail letter to her or have her pick up at front desk

## 2017-03-15 NOTE — Telephone Encounter (Signed)
Letter completed. Please make it available for the patient to pick up.

## 2017-03-15 NOTE — Telephone Encounter (Signed)
Pt states she will call back with the answer if someone will bring her or if it can be mailed. Thank you!

## 2017-04-04 ENCOUNTER — Telehealth: Payer: Self-pay | Admitting: Internal Medicine

## 2017-04-04 NOTE — Telephone Encounter (Signed)
MRI report from 04/20/16 states cirrhosis and transjugular liver biopsy was done on 06/02/16. Pt aware.

## 2017-04-05 ENCOUNTER — Ambulatory Visit
Admission: RE | Admit: 2017-04-05 | Discharge: 2017-04-05 | Disposition: A | Discharge status: Home / Self Care | Payer: MEDICAID

## 2017-04-05 DIAGNOSIS — R0602 Shortness of breath: Secondary | ICD-10-CM

## 2017-04-05 DIAGNOSIS — K7031 Alcoholic cirrhosis of liver with ascites: Principal | ICD-10-CM

## 2017-04-05 MED ORDER — ONDANSETRON HCL 4 MG TABLET
ORAL_TABLET | Freq: Three times a day (TID) | ORAL | 6 refills | 0.00000 days | Status: CP | PRN
Start: 2017-04-05 — End: 2017-07-13

## 2017-04-07 ENCOUNTER — Ambulatory Visit
Admission: RE | Admit: 2017-04-07 | Discharge: 2017-04-07 | Disposition: A | Discharge status: Home / Self Care | Payer: MEDICAID

## 2017-04-07 ENCOUNTER — Inpatient Hospital Stay
Admission: EM | Admit: 2017-04-07 | Discharge: 2017-04-10 | Disposition: A | Discharge status: Home / Self Care | Payer: MEDICAID | Source: Assisted Living Facility

## 2017-04-07 ENCOUNTER — Inpatient Hospital Stay
Admission: EM | Admit: 2017-04-07 | Discharge: 2017-04-10 | Disposition: A | Discharge status: Home / Self Care | Payer: MEDICAID | Source: Assisted Living Facility | Attending: Hospitalist | Admitting: Hospitalist

## 2017-04-07 DIAGNOSIS — R0602 Shortness of breath: Principal | ICD-10-CM

## 2017-04-07 DIAGNOSIS — R0600 Dyspnea, unspecified: Principal | ICD-10-CM

## 2017-04-07 DIAGNOSIS — K7031 Alcoholic cirrhosis of liver with ascites: Principal | ICD-10-CM

## 2017-04-08 DIAGNOSIS — R0602 Shortness of breath: Principal | ICD-10-CM

## 2017-04-09 DIAGNOSIS — R0602 Shortness of breath: Principal | ICD-10-CM

## 2017-04-10 ENCOUNTER — Telehealth: Payer: Self-pay | Admitting: *Deleted

## 2017-04-10 ENCOUNTER — Telehealth: Payer: Self-pay | Admitting: Family Medicine

## 2017-04-10 DIAGNOSIS — R0602 Shortness of breath: Principal | ICD-10-CM

## 2017-04-10 MED ORDER — GABAPENTIN 100 MG CAPSULE
ORAL_CAPSULE | Freq: Every evening | ORAL | 3 refills | 0.00000 days | Status: SS | PRN
Start: 2017-04-10 — End: 2017-10-11

## 2017-04-10 MED ORDER — TRAMADOL 50 MG TABLET
ORAL_TABLET | Freq: Three times a day (TID) | ORAL | 0 refills | 0.00000 days | Status: CP | PRN
Start: 2017-04-10 — End: 2017-04-11

## 2017-04-10 MED ORDER — SPIRONOLACTONE 100 MG TABLET
ORAL_TABLET | Freq: Every day | ORAL | 0 refills | 0 days | Status: CP
Start: 2017-04-10 — End: 2017-12-04

## 2017-04-10 NOTE — Telephone Encounter (Signed)
[facility] needing to schedule pt with a HFU with Dr. Birdie SonsSonnenberg. Pt was in for pneumothorax. Scheduled for 7/23 @ 3:30.

## 2017-04-10 NOTE — Telephone Encounter (Signed)
Julie Graham from [employer] requested a call in reference to pt long term disability.  Contact [phone]

## 2017-04-11 MED ORDER — TRAMADOL 50 MG TABLET
ORAL_TABLET | Freq: Three times a day (TID) | ORAL | 0 refills | 0.00000 days | Status: CP | PRN
Start: 2017-04-11 — End: 2017-04-13

## 2017-04-11 MED ORDER — BACITRACIN 500 UNIT-POLYMYXIN B 10,000 UNIT/GRAM TOPICAL OINTMENT
Freq: Every day | TOPICAL | 0 refills | 0 days | Status: CP
Start: 2017-04-11 — End: 2017-04-14

## 2017-04-11 NOTE — Telephone Encounter (Signed)
Transition Care Management Follow-up Telephone Call  How have you been since you were released from the hospital? Patient stated that it has been very rough being home with pain since being home. Where she had the chest tube.        Do you understand why you were in the hospital? Yes   Do you understand the discharge instrcutions? Yes  Items Reviewed:  Medications reviewed:Yes  Allergies reviewed: yes  Dietary changes reviewed: yes  Referrals reviewed:yes   Functional Questionnaire:   Activities of Daily Living (ADLs):   She states they are independent in the following: In all ADLS States they require assistance with the following: No assist required.   Any transportation issues/concerns?: NO   Any patient concerns? {Yes for pain rated at 6 when moving from where  chest tube placed. Patient did not realize tramadol was called to pharmacy per [facility] EveryWhere, advised patient to  Call pharmacy and if script not at pharmacy to [facility].   Confirmed importance and date/time of follow-up visits scheduled: Yes   Confirmed with patient if condition begins to worsen call PCP or go to the ER.  Patient was given the Call-a-Nurse line [phone]: Yes

## 2017-04-11 NOTE — Telephone Encounter (Signed)
Julie Graham from [employer] would like to know if she just has routine follow up visits, or if she will be having surgery or any therapy for long term disability.

## 2017-04-11 NOTE — Telephone Encounter (Signed)
[facility] notes in care every where..Marland Kitchen

## 2017-04-17 ENCOUNTER — Encounter: Payer: Self-pay | Admitting: Family Medicine

## 2017-04-17 ENCOUNTER — Ambulatory Visit (INDEPENDENT_AMBULATORY_CARE_PROVIDER_SITE_OTHER): Payer: Medicaid Other | Admitting: Family Medicine

## 2017-04-17 VITALS — BP 110/64 | HR 79 | Temp 97.6°F | Wt 174.2 lb

## 2017-04-17 DIAGNOSIS — R252 Cramp and spasm: Secondary | ICD-10-CM | POA: Insufficient documentation

## 2017-04-17 DIAGNOSIS — Z8709 Personal history of other diseases of the respiratory system: Secondary | ICD-10-CM

## 2017-04-17 DIAGNOSIS — I498 Other specified cardiac arrhythmias: Secondary | ICD-10-CM

## 2017-04-17 DIAGNOSIS — R251 Tremor, unspecified: Secondary | ICD-10-CM | POA: Insufficient documentation

## 2017-04-17 HISTORY — DX: Personal history of other diseases of the respiratory system: Z87.09

## 2017-04-17 LAB — GLUCOSE, POCT (MANUAL RESULT ENTRY): POC GLUCOSE: 78 mg/dL (ref 70–99)

## 2017-04-17 MED ORDER — TRAMADOL HCL 50 MG PO TABS: 50.0000 mg | ORAL_TABLET | Freq: Three times a day (TID) | ORAL | 0 refills | Status: DC | PRN

## 2017-04-17 NOTE — Assessment & Plan Note (Signed)
Patient recently hospitalized for pneumothorax following thoracentesis. Has done well since discharge with no breathing issues. Lungs sound much better than any prior exam and she is moving air well. Slight tenderness over the chest tube site though no erythema or drainage. Encouraged to monitor for recurrence of symptoms. Encouraged follow-[facility].

## 2017-04-17 NOTE — Telephone Encounter (Signed)
To my knowledge right now this is just for routine follow-up visits. I do not believe she is scheduled for surgery at this point and I am unaware of any therapy visits. They could discuss with [facility] physicians [facility] as well.

## 2017-04-17 NOTE — Assessment & Plan Note (Addendum)
Patient has had scattered muscle cramps and placed herself on potassium supplementation. She is currently on spironolactone and it was recommended to recheck potassium level at follow-up following [facility]. She is unsure if there is any benefit to the potassium as she continues to have some cramps. I advised that if there is not any benefit she should discontinue the potassium. We'll recheck a BMP today. Patient did ask about Flexeril though I advised given her liver disease this would not be a good medication.

## 2017-04-17 NOTE — Telephone Encounter (Signed)
Left message to return call 

## 2017-04-17 NOTE — Assessment & Plan Note (Addendum)
Patient reports episode of shakiness, sweatiness, and feeling lightheaded. Occurred while she was sitting down. Orthostatics are negative. Point-of-care CBG was 78 in the office. Potentially this could be related to hypoglycemia. She had not eaten very much today. We will check an EKG given her report of possible fluttering in her heart. We'll check a BMP as well. Encouraged adequate food intake. Offered nutrition referral though she declined. If she has recurrent episodes with low blood sugar could consider further workup.

## 2017-04-17 NOTE — Patient Instructions (Signed)
Nice to see you. I'm glad you're doing better than previously. If you develop any breathing issues or pain please be evaluated. I would discontinue the potassium. We'll check lab work today. I suspect your shakiness is from low blood sugar given that your blood sugar was 78. You need to make sure you are eating adequately.

## 2017-04-17 NOTE — Progress Notes (Addendum)
[name], MD Phone: [phone]  Julie Graham is a 51 y.o. female who presents today for follow-up from hospitalization.  Patient was hospitalized at [facility] from 04/07/17-04/10/17 for a pneumothorax following thoracentesis for hydrothorax. She had a chest tube with pigtail catheter placed. This was removed prior to discharge and chest x-ray at that time had been stable. She notes she is breathing better than she has anytime recently and feels better than she has over the last year. She is not having any issues with shortness of breath or cough. She's not had chest pain. She does note slight tenderness at the site of the chest tube site. She notes she felt incredibly well over the weekend and had lots of energy though today felt a little shaky and lightheaded this morning while sitting down. Felt sweaty and her sister reports she was visibly shaky. She notes no syncope or chest pain. Possibly heart fluttering. She had only eaten french fries and gummy worms today. Her sister reports the patient will skip multiple meals in a row at times. Medications reviewed. Discharge summary reviewed.  PMH: nonsmoker.   ROS see history of present illness  Objective  Physical Exam Vitals:   04/17/17 1540  BP: 110/64  Pulse: 79  Temp: 97.6 F (36.4 C)   Laying blood pressure 120/70 pulse 66 Sitting blood pressure 112/68 pulse 73 Standing blood pressure 108/70 pulse 81  BP Readings from Last 3 Encounters:  04/17/17 110/64  03/01/17 120/82  10/07/16 (!) 100/58   Wt Readings from Last 3 Encounters:  04/17/17 174 lb 3.2 oz (79 kg)  03/01/17 198 lb 6.4 oz (90 kg)  10/07/16 206 lb (93.4 kg)    Physical Exam  Constitutional: No distress.  Cardiovascular: Normal rate, regular rhythm and normal heart sounds.   Pulmonary/Chest: Effort normal and breath sounds normal.  Breath sounds normal today which is much improved from the last time I saw her when she had decreased breath sounds right lower half  of her lung  Neurological: She is alert. Gait normal.  Skin: She is not diaphoretic.   EKG: Normal sinus rhythm, rate 83, suspect artifact as cause of computer read ectopic ventricular beats  Assessment/Plan: Please see individual problem list.  History of pneumothorax Patient recently hospitalized for pneumothorax following thoracentesis. Has done well since discharge with no breathing issues. Lungs sound much better than any prior exam and she is moving air well. Slight tenderness over the chest tube site though no erythema or drainage. Encouraged to monitor for recurrence of symptoms. Encouraged follow-up with her physicians at [facility].  Muscle cramps Patient has had scattered muscle cramps and placed herself on potassium supplementation. She is currently on spironolactone and it was recommended to recheck potassium level at follow-up following her discharge from [facility]. She is unsure if there is any benefit to the potassium as she continues to have some cramps. I advised that if there is not any benefit she should discontinue the potassium. We'll recheck a BMP today. Patient did ask about Flexeril though I advised given her liver disease this would not be a good medication.  Shaky Patient reports episode of shakiness, sweatiness, and feeling lightheaded. Occurred while she was sitting down. Orthostatics are negative. Point-of-care CBG was 78 in the office. Potentially this could be related to hypoglycemia. She had not eaten very much today. We will check an EKG given her report of possible fluttering in her heart. We'll check a BMP as well. Encouraged adequate food intake. Offered nutrition  referral though she declined. If she has recurrent episodes with low blood sugar could consider further workup.   Orders Placed This Encounter  Procedures  . Basic Metabolic Panel (BMET)  . POCT Glucose (CBG)  . EKG 12-Lead    Meds ordered this encounter  Medications  . traMADol (ULTRAM) 50 MG tablet     Sig: Take 1 tablet (50 mg total) by mouth every 8 (eight) hours as needed for moderate pain.    Dispense:  30 tablet    Refill:  0  Patient reports she was prescribed tramadol at discharge from the hospital though did not receive a prescription. It appears this was prescribed in the discharge summary. Prescription given to patient today.  Marikay AlarEric Catalaya Garr, MD [facility]

## 2017-04-18 LAB — BASIC METABOLIC PANEL
BUN: 14 mg/dL (ref 6–23)
CHLORIDE: 99 meq/L (ref 96–112)
CO2: 32 meq/L (ref 19–32)
CREATININE: 0.85 mg/dL (ref 0.40–1.20)
Calcium: 9.1 mg/dL (ref 8.4–10.5)
GFR: 75.44 mL/min (ref >60.00)
Glucose, Bld: 100 mg/dL — ABNORMAL HIGH (ref 70–99)
POTASSIUM: 4.1 meq/L (ref 3.5–5.1)
Sodium: 136 mEq/L (ref 135–145)

## 2017-04-18 NOTE — Telephone Encounter (Signed)
notified

## 2017-05-04 ENCOUNTER — Ambulatory Visit
Admission: RE | Admit: 2017-05-04 | Discharge: 2017-05-04 | Disposition: A | Discharge status: Home / Self Care | Payer: MEDICAID

## 2017-05-04 ENCOUNTER — Ambulatory Visit
Admission: RE | Admit: 2017-05-04 | Discharge: 2017-05-04 | Disposition: A | Discharge status: Home / Self Care | Attending: Physician Assistant

## 2017-05-04 DIAGNOSIS — R609 Edema, unspecified: Secondary | ICD-10-CM

## 2017-05-04 DIAGNOSIS — L97222 Non-pressure chronic ulcer of left calf with fat layer exposed: Principal | ICD-10-CM

## 2017-05-04 MED ORDER — GENTAMICIN 0.1 % TOPICAL CREAM
Freq: Every day | TOPICAL | 6 refills | 0 days | Status: SS
Start: 2017-05-04 — End: 2017-12-13

## 2017-05-23 MED ORDER — FUROSEMIDE 40 MG TABLET
ORAL_TABLET | Freq: Two times a day (BID) | ORAL | 4 refills | 0.00000 days | Status: SS
Start: 2017-05-23 — End: 2017-10-10

## 2017-05-25 ENCOUNTER — Ambulatory Visit
Admission: RE | Admit: 2017-05-25 | Discharge: 2017-05-25 | Disposition: A | Discharge status: Home / Self Care | Payer: MEDICAID | Attending: Vascular Surgery | Admitting: Vascular Surgery

## 2017-05-25 ENCOUNTER — Ambulatory Visit
Admission: RE | Admit: 2017-05-25 | Discharge: 2017-05-25 | Disposition: A | Discharge status: Home / Self Care | Payer: MEDICAID

## 2017-05-25 DIAGNOSIS — K7030 Alcoholic cirrhosis of liver without ascites: Principal | ICD-10-CM

## 2017-05-29 DIAGNOSIS — R600 Localized edema: Secondary | ICD-10-CM | POA: Insufficient documentation

## 2017-06-02 ENCOUNTER — Telehealth: Payer: Self-pay | Admitting: Family Medicine

## 2017-06-02 ENCOUNTER — Ambulatory Visit: Payer: Self-pay | Admitting: Family Medicine

## 2017-06-02 NOTE — Telephone Encounter (Signed)
Please advise 

## 2017-06-02 NOTE — Telephone Encounter (Signed)
Pt came to pick up the letter for a handicapp sticker. The letter that was printed out to give to her was a work Physicist, medicalletter. Please advise. [phone].

## 2017-06-02 NOTE — Telephone Encounter (Signed)
Please clarify? Handicap sticker?

## 2017-06-02 NOTE — Telephone Encounter (Signed)
Pt said she spoke to someone here at office about getting a letter for a handicapp sticker. She was called to pick up letter, the letter that I gave her is not correct.

## 2017-06-05 NOTE — Telephone Encounter (Signed)
It looks like she needs a handicap request form. Please fill in the information and check with her to see if she meets criteria for this and then I will sign off on it if she meets criteria.

## 2017-06-06 ENCOUNTER — Ambulatory Visit
Admission: RE | Admit: 2017-06-06 | Discharge: 2017-06-06 | Disposition: A | Discharge status: Home / Self Care | Payer: MEDICAID

## 2017-06-06 DIAGNOSIS — J948 Other specified pleural conditions: Principal | ICD-10-CM

## 2017-06-06 DIAGNOSIS — K7030 Alcoholic cirrhosis of liver without ascites: Principal | ICD-10-CM

## 2017-06-06 NOTE — Telephone Encounter (Signed)
Left message to return call 

## 2017-06-14 NOTE — Telephone Encounter (Signed)
Mailed per patient request

## 2017-06-14 NOTE — Telephone Encounter (Signed)
Signed.

## 2017-06-14 NOTE — Telephone Encounter (Signed)
Patient states she can not walk more then 200 feet without stopping due to SOB

## 2017-06-26 ENCOUNTER — Ambulatory Visit
Admission: RE | Admit: 2017-06-26 | Discharge: 2017-06-26 | Disposition: A | Discharge status: Home / Self Care | Payer: MEDICAID

## 2017-06-26 DIAGNOSIS — K7030 Alcoholic cirrhosis of liver without ascites: Principal | ICD-10-CM

## 2017-07-06 ENCOUNTER — Ambulatory Visit
Admission: RE | Admit: 2017-07-06 | Discharge: 2017-07-06 | Disposition: A | Discharge status: Home / Self Care | Payer: MEDICAID

## 2017-07-06 DIAGNOSIS — K7031 Alcoholic cirrhosis of liver with ascites: Secondary | ICD-10-CM

## 2017-07-06 DIAGNOSIS — J948 Other specified pleural conditions: Principal | ICD-10-CM

## 2017-07-13 MED ORDER — ONDANSETRON HCL 8 MG TABLET: 8 mg | tablet | Freq: Three times a day (TID) | 1 refills | 0 days | Status: AC

## 2017-07-13 MED ORDER — ONDANSETRON HCL 8 MG TABLET
ORAL_TABLET | Freq: Three times a day (TID) | ORAL | 1 refills | 0.00000 days | Status: CP | PRN
Start: 2017-07-13 — End: 2017-07-13

## 2017-07-18 ENCOUNTER — Ambulatory Visit (INDEPENDENT_AMBULATORY_CARE_PROVIDER_SITE_OTHER): Payer: Medicaid Other | Admitting: Family Medicine

## 2017-07-18 ENCOUNTER — Encounter: Payer: Self-pay | Admitting: Family Medicine

## 2017-07-18 VITALS — BP 110/68 | HR 83 | Temp 98.3°F | Ht 65.0 in | Wt 170.6 lb

## 2017-07-18 DIAGNOSIS — L98499 Non-pressure chronic ulcer of skin of other sites with unspecified severity: Secondary | ICD-10-CM | POA: Diagnosis not present

## 2017-07-18 DIAGNOSIS — K7030 Alcoholic cirrhosis of liver without ascites: Secondary | ICD-10-CM

## 2017-07-18 DIAGNOSIS — M79601 Pain in right arm: Secondary | ICD-10-CM | POA: Diagnosis not present

## 2017-07-18 MED ORDER — TRAMADOL HCL 50 MG PO TABS: 50.0000 mg | ORAL_TABLET | Freq: Three times a day (TID) | ORAL | 0 refills | Status: DC | PRN

## 2017-07-18 NOTE — Progress Notes (Signed)
[name], MD Phone: [phone]  Julie Graham is a 51 y.o. female who presents today for follow-up.  Patient reports for the last several days she has had some increased dyspnea. She's been coughing more. Notes slight voice change with this. She states these are indicators of fluid accumulation in her right lung. She had a thoracentesis several weeks ago which removed 2 L of fluid. She has had some nausea as well the last several days. She's felt tender in the right upper quadrant over the last several days as well. Slight pressure sensation the rest of her abdomen. Slight improvement with that today. Some diarrhea. No vomiting. She does not feel that her abdomen is distended. Felt like she had a low-grade fever yesterday when she states she felt warm though no fever or warmth since then.  Patient also reports that her right arm has been hurting her for some time now. Notes it hurts all the time. the whole arm. No injury. Does note she was laying on her right side more and thinks that may have something to do with it. No numbness.  PMH: nonsmoker.   ROS see history of present illness  Objective  Physical Exam Vitals:   07/18/17 1426  BP: 110/68  Pulse: 83  Temp: 98.3 F (36.8 C)  SpO2: 98%    BP Readings from Last 3 Encounters:  07/18/17 110/68  04/17/17 110/64  03/01/17 120/82   Wt Readings from Last 3 Encounters:  07/18/17 170 lb 9.6 oz (77.4 kg)  04/17/17 174 lb 3.2 oz (79 kg)  03/01/17 198 lb 6.4 oz (90 kg)    Physical Exam  Constitutional: No distress.  Cardiovascular: Normal rate, regular rhythm and normal heart sounds.   Pulmonary/Chest: Effort normal.  Slight decreased breath sounds lower two thirds of right lung, normal breath sounds otherwise  Abdominal: Soft. Bowel sounds are normal. She exhibits distension (Slight if any). There is tenderness (right upper quadrant tenderness noted, pressure sensation in the rest of the abdomen on palpation). There is no  rebound and no guarding.  Musculoskeletal: She exhibits no edema.  Decreased range of motion of the right shoulder in internal rotation, external rotation, and abduction, discomfort on palpation of her upper arm laterally, no arm swelling, no left arm tenderness or decreased range of motion  Neurological: She is alert.  Skin: Skin is warm and dry. She is not diaphoretic.  Skin ulcers on lower extremities have healed well.   Assessment/Plan: Please see individual problem list.  Alcoholic cirrhosis ([place]) Patient with alcoholic cirrhosis. Following at the [facility] liver center. Given her likely reaccumulation of hepatic hydrothorax and her right upper quadrant discomfort I spoke with Primus Bravo, the patient's nurse practitioner at the [facility] liver center, regarding her symptoms. Advised of increased dyspnea and that the patient's exam did sound as though she had reaccumulated fluid in her right thorax. Also advised of the right upper quadrant pain and relative tenderness with pressure sensation on palpation of the rest of her abdomen. Discussed that the patient appeared to be minimally distended though much less than prior. Discussed with the patient felt like she was much less distended than prior. Advised that she had a possible low-grade fever yesterday as well. Discussed that the patient appeared well appearing and did not appear to be ill currently. Primus Bravo advised that she would get the patient in for thoracentesis. She suggested checking a CBC and a metabolic panel to evaluate white blood cell count and electrolytes. Discussed that  the patient had an MRI recently with only minimal ascites. She advised that if the patient were to develop worsening abdominal discomfort she should be evaluated. I passed these recommendations on the patient. Advised the patient that she would be receiving a call from the [facility] liver center. Advised the patient if she had fever, worsening abdominal discomfort, or any  worsening other symptoms to be evaluated immediately.  Skin ulcer ([place]) Well healed. Monitor for any recurrence. No edema.  Right arm pain I suspect this is originating from her shoulder and she may be developing frozen shoulder given the decreased range of motion. Potentially could come from her neck though she notes no neck pain and it is not typical radicular pain. Discussed several options including physical therapy, imaging, and referral to orthopedics or sports medicine. Patient opted to monitor for now. Discussed that if she changes her mind she can let us know. Tramadol refilled for discomfort.   Orders Placed This Encounter  Procedures  . Comp Met (CMET)  . CBC    Meds ordered this encounter  Medications  . traMADol (ULTRAM) 50 MG tablet    Sig: Take 1 tablet (50 mg total) by mouth every 8 (eight) hours as needed for moderate pain.    Dispense:  30 tablet    Refill:  0    [name], MD [place]

## 2017-07-18 NOTE — Assessment & Plan Note (Signed)
Well healed. Monitor for any recurrence. No edema.

## 2017-07-18 NOTE — Patient Instructions (Signed)
Nice to see you. We will check lab work and contact her with the results. You [facility] be [facility] a [facility]. If you would like evaluation for your right arm and shoulder please let us know. If you develop any worsening stomach discomfort, stomach swelling, fevers, nausea, vomiting, diarrhea, or any new or changing symptoms please seek medical attention immediately.

## 2017-07-18 NOTE — Assessment & Plan Note (Addendum)
I suspect this is originating from her shoulder and she may be developing frozen shoulder given the decreased range of motion. Potentially could come from her neck though she notes no neck pain and it is not typical radicular pain. Discussed several options including physical therapy, imaging, and referral to orthopedics or sports medicine. Patient opted to monitor for now. Discussed that if she changes her mind she can let us know. Tramadol refilled for discomfort.

## 2017-07-18 NOTE — Assessment & Plan Note (Signed)
Patient with alcoholic cirrhosis. Following at the [facility]. Given her likely reaccumulation of hepatic hydrothorax and her right upper quadrant discomfort I spoke with Julie Graham, the patient's nurse practitioner at [facility], regarding her symptoms. Advised of increased dyspnea and that the patient's exam did sound as though she had reaccumulated fluid in her right thorax. Also advised of the right upper quadrant pain and relative tenderness with pressure sensation on palpation of the rest of her abdomen. Discussed that the patient appeared to be minimally distended though much less than prior. Discussed with the patient felt like she was much less distended than prior. Advised that she had a possible low-grade fever yesterday as well. Discussed that the patient appeared well appearing and did not appear to be ill currently. Julie Graham advised that she would get the patient in for thoracentesis. She suggested checking a CBC and a metabolic panel to evaluate white blood cell count and electrolytes. Discussed that the patient had an MRI recently with only minimal ascites. She advised that if the patient were to develop worsening abdominal discomfort she should be evaluated. I passed these recommendations on the patient. Advised the patient that she would be receiving a call from the [facility] center. Advised the patient if she had fever, worsening abdominal discomfort, or any worsening other symptoms to be evaluated immediately.

## 2017-07-19 LAB — COMPREHENSIVE METABOLIC PANEL
ALT: 15 U/L (ref 0–35)
AST: 35 U/L (ref 0–37)
Albumin: 3.2 g/dL — ABNORMAL LOW (ref 3.5–5.2)
Alkaline Phosphatase: 72 U/L (ref 39–117)
BUN: 8 mg/dL (ref 6–23)
CHLORIDE: 101 meq/L (ref 96–112)
CO2: 32 meq/L (ref 19–32)
Calcium: 9.1 mg/dL (ref 8.4–10.5)
Creatinine, Ser: 0.88 mg/dL (ref 0.40–1.20)
GFR: 72.41 mL/min (ref >60.00)
GLUCOSE: 83 mg/dL (ref 70–99)
POTASSIUM: 4.6 meq/L (ref 3.5–5.1)
SODIUM: 139 meq/L (ref 135–145)
TOTAL PROTEIN: 6.8 g/dL (ref 6.0–8.3)
Total Bilirubin: 0.8 mg/dL (ref 0.2–1.2)

## 2017-07-19 LAB — CBC
HEMATOCRIT: 38.5 % (ref 36.0–46.0)
HEMOGLOBIN: 12.6 g/dL (ref 12.0–15.0)
MCHC: 32.6 g/dL (ref 30.0–36.0)
MCV: 88.9 fl (ref 78.0–100.0)
PLATELETS: 185 10*3/uL (ref 150.0–400.0)
RBC: 4.32 Mil/uL (ref 3.87–5.11)
RDW: 14.5 % (ref 11.5–15.5)
WBC: 5 10*3/uL (ref 4.0–10.5)

## 2017-07-24 ENCOUNTER — Ambulatory Visit
Admission: RE | Admit: 2017-07-24 | Discharge: 2017-07-24 | Disposition: A | Discharge status: Home / Self Care | Payer: MEDICAID

## 2017-07-24 DIAGNOSIS — K7030 Alcoholic cirrhosis of liver without ascites: Principal | ICD-10-CM

## 2017-07-27 ENCOUNTER — Ambulatory Visit
Admission: RE | Admit: 2017-07-27 | Discharge: 2017-07-27 | Disposition: A | Discharge status: Home / Self Care | Payer: MEDICAID

## 2017-07-27 DIAGNOSIS — K769 Liver disease, unspecified: Principal | ICD-10-CM

## 2017-07-27 DIAGNOSIS — J918 Pleural effusion in other conditions classified elsewhere: Secondary | ICD-10-CM

## 2017-07-27 DIAGNOSIS — J948 Other specified pleural conditions: Principal | ICD-10-CM

## 2017-08-10 MED ORDER — FOLIC ACID 1 MG TABLET
ORAL_TABLET | Freq: Every day | ORAL | 5 refills | 0 days | Status: CP
Start: 2017-08-10 — End: 2018-02-14

## 2017-08-31 ENCOUNTER — Ambulatory Visit
Admission: RE | Admit: 2017-08-31 | Discharge: 2017-08-31 | Disposition: A | Discharge status: Home / Self Care | Payer: MEDICAID

## 2017-08-31 ENCOUNTER — Ambulatory Visit
Admission: RE | Admit: 2017-08-31 | Discharge: 2017-08-31 | Disposition: A | Discharge status: Home / Self Care | Payer: MEDICAID | Attending: Vascular & Interventional Radiology | Admitting: Vascular & Interventional Radiology

## 2017-08-31 DIAGNOSIS — J918 Pleural effusion in other conditions classified elsewhere: Secondary | ICD-10-CM

## 2017-08-31 DIAGNOSIS — K7031 Alcoholic cirrhosis of liver with ascites: Principal | ICD-10-CM

## 2017-08-31 DIAGNOSIS — K7030 Alcoholic cirrhosis of liver without ascites: Principal | ICD-10-CM

## 2017-08-31 DIAGNOSIS — K769 Liver disease, unspecified: Secondary | ICD-10-CM

## 2017-09-14 ENCOUNTER — Ambulatory Visit: Payer: Self-pay | Admitting: *Deleted

## 2017-09-14 NOTE — Telephone Encounter (Signed)
Agree with triage that patient needs to be evaluated. Please follow up with the patient to encourage evaluation.

## 2017-09-14 NOTE — Telephone Encounter (Signed)
Pt called with complaints of dizzinesslast episode 2 days ago; feels like blood sugar is low but it shouldn't be because she has happened; has had multiple episodes in the couple of months;  Pt also states that she has fluid in her right lung (pleural effusion) due to cirrohisis and is having shortness of breath with exertion; per pt thoracentesis is scheduled for 09/15/17 at 1400; and is scheduled for surgery (TIPS)  in January; she is most concerned with her dizziness; pt triaged per protocol and pt directed to go to ED; pt states that she wants to see Dr Birdie SonsSonnenberg; conference call initiated with Mal [facility]Brock in [facility] and recommendation was made that pt go to ED due to her symptoms and past medical history; pt declines and states that if something significant happens that she will go [facility] since her [facility] is [facility]; [facility] to make them aware of this encounter.      Reason for Disposition . Difficulty breathing  Answer Assessment - Initial Assessment Questions 1. DESCRIPTION: "Describe your dizziness."     "Feels like blood sugar is low" 2. LIGHTHEADED: "Do you feel lightheaded?" (e.g., somewhat faint, woozy, weak upon standing)    " Weak like a limp dish rag"; feels weak and fatigued when the episode occurs 3. VERTIGO: "Do you feel like either you or the room is spinning or tilting?" (i.e. vertigo)     "somewhat" 4. SEVERITY: "How bad is it?"  "Do you feel like you are going to faint?" "Can you stand and walk?"   - MILD - walking normally   - MODERATE - interferes with normal activities (e.g., work, school)    - SEVERE - unable to stand, requires support to walk, feels like passing out now.      severe 5. ONSET:  "When did the dizziness begin?"     A couple of months ago 6. AGGRAVATING FACTORS: "Does anything make it worse?" (e.g., standing, change in head position)     No, happens at anytime 7. HEART RATE: "Can you tell me your heart rate?" "How many  beats in 15 seconds?"  (Note: not all patients can do this)       no 8. CAUSE: "What do you think is causing the dizziness?"     unknown 9. RECURRENT SYMPTOM: "Have you had dizziness before?" If so, ask: "When was the last time?" "What happened that time?"    09/12/17 when pt was trying to get out of her car 10. OTHER SYMPTOMS: "Do you have any other symptoms?" (e.g., fever, chest pain, vomiting, diarrhea, bleeding)       Cramping in hands 11. PREGNANCY: "Is there any chance you are pregnant?" "When was your last menstrual period?"       Yes; period started today ("last period 2 years ago maybe due to stressful relationship, but started again today")  Protocols used: DIZZINESS [facility]- LIGHTHEADEDNESS-A-AH

## 2017-09-14 NOTE — Telephone Encounter (Signed)
fyi

## 2017-09-15 ENCOUNTER — Ambulatory Visit
Admission: RE | Admit: 2017-09-15 | Discharge: 2017-09-15 | Disposition: A | Discharge status: Home / Self Care | Payer: MEDICAID

## 2017-09-15 DIAGNOSIS — J90 Pleural effusion, not elsewhere classified: Principal | ICD-10-CM

## 2017-09-15 NOTE — Telephone Encounter (Signed)
Please contact patient since neither Olegario MessierKathy or Mal AmabileBrock are here today. I do not see where pt when to ER. Per [facility], pt refused to go yesterday.

## 2017-09-15 NOTE — Telephone Encounter (Signed)
Left message to return call, ok for pec to speak to patient to see how she is doing and if she went to ER or urgent care

## 2017-09-22 NOTE — Telephone Encounter (Signed)
I have tried to contact patient left message to return call to office.[facility] may speak with need update status see message from PCP attached.

## 2017-09-22 NOTE — Telephone Encounter (Signed)
I was not ale to get in contact with patient

## 2017-09-28 NOTE — Telephone Encounter (Signed)
Left message to return call to office.

## 2017-10-09 DIAGNOSIS — K7030 Alcoholic cirrhosis of liver without ascites: Principal | ICD-10-CM

## 2017-10-10 ENCOUNTER — Ambulatory Visit: Admit: 2017-10-10 | Discharge: 2017-10-11 | Disposition: A | Discharge status: Home / Self Care | Payer: MEDICARE

## 2017-10-10 ENCOUNTER — Encounter
Admit: 2017-10-10 | Discharge: 2017-10-11 | Disposition: A | Discharge status: Home / Self Care | Payer: MEDICARE | Attending: Certified Registered"

## 2017-10-10 DIAGNOSIS — K7030 Alcoholic cirrhosis of liver without ascites: Principal | ICD-10-CM

## 2017-10-10 DIAGNOSIS — Z95828 Presence of other vascular implants and grafts: Secondary | ICD-10-CM | POA: Insufficient documentation

## 2017-10-11 MED ORDER — CIPROFLOXACIN 500 MG TABLET
ORAL_TABLET | Freq: Every day | ORAL | 0 refills | 0.00000 days | Status: CP
Start: 2017-10-11 — End: 2017-10-11

## 2017-10-11 MED ORDER — GABAPENTIN 100 MG CAPSULE
ORAL_CAPSULE | Freq: Every evening | ORAL | 0 refills | 0.00000 days | Status: CP | PRN
Start: 2017-10-11 — End: 2017-12-14

## 2017-10-11 MED ORDER — TRAMADOL 50 MG TABLET
ORAL_TABLET | Freq: Three times a day (TID) | ORAL | 0 refills | 0.00000 days | PRN
Start: 2017-10-11 — End: 2017-10-14

## 2017-10-11 MED ORDER — OMEPRAZOLE 40 MG CAPSULE,DELAYED RELEASE
ORAL_CAPSULE | Freq: Every day | ORAL | 0 refills | 0.00000 days | Status: CP
Start: 2017-10-11 — End: 2018-10-11

## 2017-10-11 MED ORDER — CIPROFLOXACIN 500 MG TABLET: 500 mg | tablet | 0 refills | 0 days

## 2017-10-11 MED ORDER — ONDANSETRON 4 MG DISINTEGRATING TABLET: tablet | 0 refills | 0 days

## 2017-10-11 MED ORDER — OMEPRAZOLE 40 MG CAPSULE,DELAYED RELEASE: 40 mg | capsule | Freq: Every day | 0 refills | 0 days | Status: AC

## 2017-10-11 MED ORDER — GABAPENTIN 100 MG CAPSULE: 200 mg | capsule | Freq: Every evening | 0 refills | 0 days

## 2017-10-11 MED ORDER — ONDANSETRON 4 MG DISINTEGRATING TABLET
ORAL_TABLET | Freq: Four times a day (QID) | ORAL | 0 refills | 0.00000 days | Status: CP | PRN
Start: 2017-10-11 — End: 2017-10-11

## 2017-10-11 MED FILL — CIPROFLOXACIN/500MG/TABS: CIPROFLOXACIN/500MG/TABS | 10 days supply | Qty: 10 | Fill #0

## 2017-10-11 MED FILL — GABAPENTIN/100MG/CAPS: GABAPENTIN/100MG/CAPS | 30 days supply | Qty: 60 | Fill #0

## 2017-10-11 MED FILL — OMEPRAZOLE/40MG/CPDR: OMEPRAZOLE/40MG/CPDR | 30 days supply | Qty: 30 | Fill #0

## 2017-10-11 MED FILL — ONDANSETRON ODT/4MG/TBDP: ONDANSETRON ODT/4MG/TBDP | 7 days supply | Qty: 30 | Fill #0

## 2017-10-11 MED FILL — TRAMADOL HCL/50MG/TABS: TRAMADOL HCL/50MG/TABS | 3 days supply | Qty: 9 | Fill #0

## 2017-10-20 ENCOUNTER — Ambulatory Visit: Admit: 2017-10-20 | Discharge: 2017-10-21 | Payer: MEDICARE

## 2017-10-20 DIAGNOSIS — K769 Liver disease, unspecified: Secondary | ICD-10-CM

## 2017-10-20 DIAGNOSIS — J918 Pleural effusion in other conditions classified elsewhere: Secondary | ICD-10-CM

## 2017-10-20 DIAGNOSIS — K7030 Alcoholic cirrhosis of liver without ascites: Principal | ICD-10-CM

## 2017-10-20 DIAGNOSIS — K7290 Hepatic failure, unspecified without coma: Secondary | ICD-10-CM

## 2017-10-20 DIAGNOSIS — J90 Pleural effusion, not elsewhere classified: Secondary | ICD-10-CM

## 2017-10-20 LAB — RED CELL DISTRIBUTION WIDTH: Lab: 15.2 — ABNORMAL HIGH

## 2017-10-20 LAB — GAMMA GLUTAMYL TRANSFERASE: Gamma glutamyl transferase:CCnc:Pt:Ser/Plas:Qn:: 164 — ABNORMAL HIGH

## 2017-10-20 LAB — COMPREHENSIVE METABOLIC PANEL
ALBUMIN: 3.5 g/dL (ref 3.5–5.0)
ALT (SGPT): 53 U/L — ABNORMAL HIGH (ref 15–48)
ANION GAP: 6 mmol/L — ABNORMAL LOW (ref 9–15)
AST (SGOT): 93 U/L — ABNORMAL HIGH (ref 14–38)
BILIRUBIN TOTAL: 1.9 mg/dL — ABNORMAL HIGH (ref 0.0–1.2)
BLOOD UREA NITROGEN: 11 mg/dL (ref 7–21)
BUN / CREAT RATIO: 17
CALCIUM: 8.7 mg/dL (ref 8.5–10.2)
CHLORIDE: 105 mmol/L (ref 98–107)
CO2: 31 mmol/L — ABNORMAL HIGH (ref 22.0–30.0)
CREATININE: 0.66 mg/dL (ref 0.60–1.00)
EGFR MDRD NON AF AMER: >=60 mL/min/{1.73_m2} (ref >=60)
GLUCOSE RANDOM: 90 mg/dL (ref 65–179)
POTASSIUM: 4 mmol/L (ref 3.5–5.0)
PROTEIN TOTAL: 6.9 g/dL (ref 6.5–8.3)
SODIUM: 142 mmol/L (ref 135–145)

## 2017-10-20 LAB — CBC
HEMOGLOBIN: 13.4 g/dL (ref 12.0–16.0)
MEAN CORPUSCULAR HEMOGLOBIN CONC: 32.5 g/dL (ref 31.0–37.0)
MEAN CORPUSCULAR HEMOGLOBIN: 29.1 pg (ref 26.0–34.0)
MEAN CORPUSCULAR VOLUME: 89.5 fL (ref 80.0–100.0)
MEAN PLATELET VOLUME: 7.6 fL (ref 7.0–10.0)
RED BLOOD CELL COUNT: 4.62 10*12/L (ref 4.00–5.20)
RED CELL DISTRIBUTION WIDTH: 15.2 % — ABNORMAL HIGH (ref 12.0–15.0)
WBC ADJUSTED: 5.5 10*9/L (ref 4.5–11.0)

## 2017-10-20 LAB — EGFR MDRD AF AMER: Glomerular filtration rate/1.73 sq M.predicted.black:ArVRat:Pt:Ser/Plas/Bld:Qn:Creatinine-based formula (MDRD): >=60

## 2017-10-20 LAB — INR: Lab: 1.29

## 2017-10-20 LAB — AMMONIA: Ammonia:SCnc:Pt:Plas:Qn:: 226 — ABNORMAL HIGH

## 2017-10-20 MED ORDER — RIFAXIMIN 550 MG TABLET
ORAL_TABLET | Freq: Two times a day (BID) | ORAL | 4 refills | 0 days | Status: CP
Start: 2017-10-20 — End: 2018-04-03

## 2017-10-20 MED ORDER — LACTULOSE 10 GRAM/15 ML ORAL SOLUTION
Freq: Three times a day (TID) | ORAL | 11 refills | 0.00000 days | Status: CP
Start: 2017-10-20 — End: 2018-04-03

## 2017-10-20 MED ORDER — XIFAXAN 550 MG TABLET
ORAL_TABLET | ORAL | 8 refills | 0.00000 days | Status: CP
Start: 2017-10-20 — End: 2018-10-21

## 2017-10-20 MED ORDER — CIPROFLOXACIN 500 MG TABLET
ORAL_TABLET | Freq: Every day | ORAL | 0 refills | 0.00000 days | Status: CP
Start: 2017-10-20 — End: 2018-01-09

## 2017-10-20 MED ORDER — XIFAXAN 550 MG TABLET: 550 mg | tablet | 8 refills | 0 days | Status: AC

## 2017-10-20 NOTE — Unmapped (Signed)
Per test claim for Xifaxan at the [facility], patient needs Medication Assistance Program for Prior Authorization.
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
## 2017-10-20 NOTE — Unmapped (Signed)
New Medications: Xifaxin 550mg , 1 tab twice per day, this will be delivered to your home (may take 2 weeks) Helps with ammonia levels (confusion)    TAKE YOUR LACTULOSE, goal of 3-5 BMS per day    Refill sent in for lactulose and Cipro to your local pharmacy.     Chest x-ray today if lot of fluid will get drain set up and get Liver doppler the same day.       Please follow a 2000mg  sodium restricted diet      It is OK to take up to 2000mg  of acetaminophen (tylenol), please be cautious of combination products. Do not take ibuprofen, naproxen, aspirin, advil, aleve, motrin, Excedrin or headache powders.    Please Return to clinic in 8 weeks  If you have not received a notice about a follow-up appointment please call the Liver Center at [phone]

## 2017-10-23 ENCOUNTER — Ambulatory Visit: Payer: Medicaid Other | Admitting: Family Medicine

## 2017-10-23 DIAGNOSIS — Z0289 Encounter for other administrative examinations: Secondary | ICD-10-CM

## 2017-10-23 NOTE — Unmapped (Unsigned)
XIFAXIN APPROVED FOR $3

## 2017-10-25 NOTE — Unmapped (Signed)
Pre-op complete, no sedation.

## 2017-10-27 ENCOUNTER — Ambulatory Visit: Admit: 2017-10-27 | Discharge: 2017-10-27 | Payer: MEDICARE

## 2017-11-03 MED FILL — XIFAXAN/550MG/TABS: XIFAXAN/550MG/TABS | 30 days supply | Qty: 60 | Fill #0

## 2017-11-03 NOTE — Unmapped (Addendum)
3/11 update: patient did receive this March delivery. However did not receive Feb delivery and wants [place] to open ticket. I did call [employer] and open ticket - did not give hipaa info - just said it was a medication and that q#60 and cost (cash cost $3000). They will call patient (she ok'ed me to let them contact her when I spoke with patient) and then email me with the result of investigation. Patient also took tracking number for package in case she needs it as well.    3/6 update: patient states never received 2/11 xifaxin - ups says left at front door. She will check with neighbors and let us know (she is lot 3, there are other people at same address diff lot #s).  3/6 update again: ins will actually cover a refill now. LM for patient to see if she wants [place] to send out this refill for tomorrow delivery while she continues to research what happened to last package so she can go on and get started.  11/29/17: Final Update: Original package not found by patient. I filled Rx again and shipped it out again and she should receive medication on 11/30/17:GMR      [facility]   Patient Onboarding/Medication Counseling    Ms.Grindstaff is a 52 y.o. female with cirrhosis/risk of HE who I am counseling today on initiation of therapy.    Medication: Xifaxan    Verified patient's date of birth / HIPAA.      Education Provided: ??    Dose/Administration discussed: 1 tab twice daily. This medication should be taken  without regard to food.     Storage requirements: this medicine should be stored at room temperature.     Side effects discussed: Discussed common side effects, including potential gi upset. If patient experiences severe gi upset or changes in fluid status, they need to call the doctor.  Patient will receive a Lexi-Comp drug information handout with shipment.    Handling precautions reviewed:  n/a.    Drug Interactions: other medications reviewed and up to date in Epic.  No drug interactions identified. Comorbidities/Allergies: reviewed and up to date in Epic.    Verified therapy is appropriate and should continue      Delivery Information    Anticipated copay of $3 reviewed with patient. Verified delivery address in FSI and reviewed medication storage requirement.    Scheduled delivery date: Friday, November 29, 2017    Explained that we ship using [employer] and this shipment will not require a signature.      Explained the services we provide at [facility] and that each month we would call to set up refills.  Stressed importance of returning phone calls so that we could ensure they receive their medications in time each month.  Informed patient that we should be setting up refills 7-10 days prior to when they will run out of medication.  Informed patient that welcome packet will be sent.      Patient verbalized understanding of the above information as well as how to contact the pharmacy at [phone] option 4 with any questions/concerns.        Patient Specific Needs      ? Patient has no physical or cognitive barriers.    ? Patient prefers to have medications discussed with  Patient     ? Patient is able to read and understand education materials at a high school level or above.  Lanney GinsMeghan A Rahn Lacuesta  [facility] Specialty Pharmacist

## 2017-11-09 ENCOUNTER — Telehealth: Payer: Self-pay | Admitting: Family Medicine

## 2017-11-09 NOTE — Telephone Encounter (Signed)
Copied from CRM [phone]. Topic: Appointment Scheduling - Scheduling Inquiry for Clinic >> Nov 09, 2017  3:21 PM Oneal GroutSebastian, Jennifer S wrote: Reason for CRM: Had tips procedure for her liver on 10/10/17, states she needs a follow up asap with Dr Antony HasteSonnenburg. Able to work in? Please advise

## 2017-11-09 NOTE — Telephone Encounter (Signed)
scheduled

## 2017-11-10 ENCOUNTER — Telehealth: Payer: Self-pay | Admitting: *Deleted

## 2017-11-10 NOTE — Telephone Encounter (Signed)
faxed

## 2017-11-10 NOTE — Telephone Encounter (Signed)
Please advise 

## 2017-11-10 NOTE — Telephone Encounter (Signed)
Copied from CRM [id number]. Topic: General - Other >> Nov 10, 2017  2:16 PM Raquel SarnaHayes, Teresa G wrote: Pt has an follow up appt with Dr. Birdie SonsSonnenberg, but she also has a court date she has to go to that morning   Lawyer is needing a simple note saying pt has an appt on Mon. at 11:30 incase she has to leave the court before it is finished.  Please fax note to Lawyer office - fax [phone] Att: Harvel QualeJulian Doby - Lawyer

## 2017-11-10 NOTE — Telephone Encounter (Signed)
Created. Please fax

## 2017-11-10 NOTE — Unmapped (Signed)
Needs thoracentesis at [facility]. She was scheduled for this last week but was unable to keep the appointment.   She said she had the flu with nausea and vomiting and couldn???t leave the house.  Feels lousy ??? wants to know if she should feel this way after TIPS. Explained that it often takes 6-8 weeks for the TIPS to  Become functional.    New order for IR thora to [facility].

## 2017-11-13 ENCOUNTER — Encounter: Payer: Self-pay | Admitting: Family Medicine

## 2017-11-13 ENCOUNTER — Ambulatory Visit (INDEPENDENT_AMBULATORY_CARE_PROVIDER_SITE_OTHER): Payer: Medicaid Other | Admitting: Family Medicine

## 2017-11-13 ENCOUNTER — Other Ambulatory Visit: Payer: Self-pay

## 2017-11-13 VITALS — BP 100/60 | HR 68 | Temp 97.6°F | Wt 178.0 lb

## 2017-11-13 DIAGNOSIS — G44209 Tension-type headache, unspecified, not intractable: Secondary | ICD-10-CM | POA: Insufficient documentation

## 2017-11-13 DIAGNOSIS — R55 Syncope and collapse: Secondary | ICD-10-CM

## 2017-11-13 DIAGNOSIS — R002 Palpitations: Secondary | ICD-10-CM

## 2017-11-13 DIAGNOSIS — R42 Dizziness and giddiness: Secondary | ICD-10-CM | POA: Diagnosis not present

## 2017-11-13 DIAGNOSIS — E162 Hypoglycemia, unspecified: Secondary | ICD-10-CM | POA: Diagnosis not present

## 2017-11-13 DIAGNOSIS — J948 Other specified pleural conditions: Secondary | ICD-10-CM | POA: Diagnosis not present

## 2017-11-13 DIAGNOSIS — K7031 Alcoholic cirrhosis of liver with ascites: Secondary | ICD-10-CM

## 2017-11-13 LAB — BASIC METABOLIC PANEL
BUN: 13 mg/dL (ref 6–23)
CALCIUM: 8.6 mg/dL (ref 8.4–10.5)
CO2: 28 meq/L (ref 19–32)
CREATININE: 0.7 mg/dL (ref 0.40–1.20)
Chloride: 101 mEq/L (ref 96–112)
GFR: 94.17 mL/min (ref >60.00)
GLUCOSE: 85 mg/dL (ref 70–99)
Potassium: 4.9 mEq/L (ref 3.5–5.1)
Sodium: 137 mEq/L (ref 135–145)

## 2017-11-13 LAB — AMMONIA: Ammonia: 79 umol/L — ABNORMAL HIGH (ref 11–35)

## 2017-11-13 MED ORDER — BLOOD GLUCOSE MONITOR KIT: PACK | 0 refills | Status: AC

## 2017-11-13 NOTE — Patient Instructions (Signed)
Nice to see you. Please take your lactulose as you have previously been advised.  We are going to check lab work today and contact you with the results. You need to get set up for your thoracentesis as well. If you develop worsening headache, or you develop numbness, weakness, fevers, trouble breathing, chest pain, or any new or changing symptoms please seek medical attention immediately.

## 2017-11-13 NOTE — Progress Notes (Signed)
[name], MD Phone: [phone]  Julie Graham is a 52 y.o. female who presents today for follow-up.  Patient presents in follow-up.  She underwent a TIPS procedure in January and she notes the first week after that she had a fair bit of confusion.  She followed up with GI about 10 days after the procedure and discussed her confusion.  They advised consistent use of lactulose as she had not been using that consistently.  She has not been consistently using the lactulose since then either.  Takes it at most once a day.  They also wrote a prescription for rifaximin which she has not started on yet.  She has not received this.  She notes occasional shaking of her hands.  Notes her confusion has improved though occasionally does still have some.  She had a stomach virus about a week after her TIPS procedure.  She had some nausea and vomiting for 1 day.  Not much diarrhea.  She notes that has been improving.  She continues on ciprofloxacin for SBP prophylaxis.  No significant abdominal discomfort.  She does note occasional dull achy headaches off and on for some time though they have become more frequent since her procedure.  She does have a pill for nausea though she does not know the name.  She notes no numbness or focal weakness.  Overall just feels fatigued and weak.  Some sinus drainage.  Feels like she has had some fluid accumulation as her weight has up slightly.  She was set up for a thoracentesis sometime in the last couple of weeks though had to reschedule related to being sick and is in the process of getting this set up again.  Her breathing is okay and not as bad as it has been in the past.  Occasional lightheadedness that can occur with standing or while seated.  Feels like she is going to pass out when this occurs though has not passed out.  Feels a slight palpitation with this.  She continues on spironolactone.  Wonders about checking her ammonia level today.  Her last ammonia level was 200  per her report.  She has had no fevers.  Social History   Tobacco Use  Smoking Status Never Smoker  Smokeless Tobacco Never Used     ROS see history of present illness  Objective  Physical Exam Vitals:   11/13/17 1128  BP: 100/60  Pulse: 68  Temp: 97.6 F (36.4 C)  SpO2: 97%   Laying blood pressure 110/62 pulse 64 Sitting blood pressure 108/60 pulse 69 Standing blood pressure 102/58 pulse 72  BP Readings from Last 3 Encounters:  11/13/17 100/60  07/18/17 110/68  04/17/17 110/64   Wt Readings from Last 3 Encounters:  11/13/17 178 lb (80.7 kg)  07/18/17 170 lb 9.6 oz (77.4 kg)  04/17/17 174 lb 3.2 oz (79 kg)    Physical Exam  Constitutional: She is oriented to person, place, and time. No distress.  HENT:  Head: Normocephalic and atraumatic.  Mouth/Throat: Oropharynx is clear and moist. No oropharyngeal exudate.  Eyes: Pupils are equal, round, and reactive to light.  Cardiovascular: Normal rate, regular rhythm and normal heart sounds.  Pulmonary/Chest: Effort normal. No respiratory distress. She has no wheezes. She has no rales.  Right lung lower third with decreased breath sounds, normal breath sounds upper two thirds of lung, normal left breath sounds  Musculoskeletal: She exhibits no edema.  Neurological: She is alert and oriented to person, place, and time. Gait  normal.  CN 2-12 intact, 5/5 strength in bilateral biceps, triceps, grip, quads, hamstrings, plantar and dorsiflexion, sensation to light touch intact in bilateral UE and LE  Skin: Skin is warm and dry. She is not diaphoretic.   EKG: Normal sinus rhythm, negative precordial T waves, no evidence of arrhythmia or ischemic changes  Assessment/Plan: Please see individual problem list.  Alcoholic cirrhosis ([place]) Status post TIPS procedure with progressive improvement in confusion.  Lung sounds seem improved from the last time I saw her.  She has been in contact with her GI physician's office and has been  advised that it may take a number of weeks prior to the tips taking full effect.  She was advised today to adequately take her lactulose.  We will check an ammonia level at her request though I did discuss this would not change the plan.  She does not appear confused today.  She will follow-up with GI as planned.  Postural dizziness with presyncope Symptoms of lightheadedness though no syncope.  Orthostatics were negative today.  EKG is reassuring.  We will check for dehydration on lab work.  A few palpitations at times with this.  No chest pain.  Lab work has returned without cause.  Does have a history of hypoglycemia which could potentially cause issues with this.  Also concerning for possible cardiac cause.  We will refer to cardiology for evaluation.  We will have her start checking her blood sugar.  A glucometer will be faxed to her pharmacy.  Hypoglycemia We will have her start checking sugars at home to see if low blood sugar would be contributing to her symptoms.  Tension headache Suspect headaches are tension in nature.  Benign exam.  She will monitor and see if this improves the further she gets away from her TIPS procedure.  Hydrothorax Patient is due for thoracentesis and is working on getting this set up.  She does not appear to be in any distress at this time.   Orders Placed This Encounter  Procedures  . Basic Metabolic Panel (BMET)  . Ammonia  . Ambulatory referral to Cardiology    Referral Priority:   Routine    Referral Type:   Consultation    Referral Reason:   Specialty Services Required    Requested Specialty:   Cardiology    Number of Visits Requested:   1  . EKG 12-Lead    Meds ordered this encounter  Medications  . blood glucose meter kit and supplies KIT    Sig: Check once daily if feeling lightheaded. E16.2.    Dispense:  1 each    Refill:  0    Order Specific Question:   Number of strips    Answer:   100    Order Specific Question:   Number of lancets      Answer:   [place], MD [place]

## 2017-11-13 NOTE — Assessment & Plan Note (Addendum)
Symptoms of lightheadedness though no syncope.  Orthostatics were negative today.  EKG is reassuring.  We will check for dehydration on lab work.  A few palpitations at times with this.  No chest pain.  Lab work has returned without cause.  Does have a history of hypoglycemia which could potentially cause issues with this.  Also concerning for possible cardiac cause.  We will refer to cardiology for evaluation.  We will have her start checking her blood sugar.  A glucometer will be faxed to her pharmacy.

## 2017-11-13 NOTE — Assessment & Plan Note (Signed)
Suspect headaches are tension in nature.  Benign exam.  She will monitor and see if this improves the further she gets away from her TIPS procedure.

## 2017-11-13 NOTE — Assessment & Plan Note (Signed)
Status post TIPS procedure with progressive improvement in confusion.  Lung sounds seem improved from the last time I saw her.  She has been in contact with her GI physician's office and has been advised that it may take a number of weeks prior to the tips taking full effect.  She was advised today to adequately take her lactulose.  We will check an ammonia level at her request though I did discuss this would not change the plan.  She does not appear confused today.  She will follow-up with GI as planned.

## 2017-11-13 NOTE — Assessment & Plan Note (Signed)
We will have her start checking sugars at home to see if low blood sugar would be contributing to her symptoms.

## 2017-11-13 NOTE — Assessment & Plan Note (Signed)
Patient is due for thoracentesis and is working on getting this set up.  She does not appear to be in any distress at this time.

## 2017-11-14 ENCOUNTER — Telehealth: Payer: Self-pay

## 2017-11-14 NOTE — Telephone Encounter (Signed)
-----   Message from Glori LuisEric G Sonnenberg, MD sent at 11/13/2017  5:18 PM EST ----- Please also let her know I placed a referral to cardiology. Thanks. Eric.

## 2017-11-14 NOTE — Telephone Encounter (Signed)
Left message to return call, ok for pec to speak to patient and notify her that we have placed a referral to cardiology and we have faxed in a prescription for a glucometer to the pharmacy.

## 2017-11-14 NOTE — Telephone Encounter (Signed)
Pt. Given information on referral and glucometer.Verbalizes understanding.

## 2017-11-24 ENCOUNTER — Ambulatory Visit: Admit: 2017-11-24 | Discharge: 2017-11-24 | Payer: MEDICARE

## 2017-11-25 NOTE — Unmapped (Signed)
Patient called and was very distressed that she missed her appointment for thoracentesis yesterday.  She stated that she fell asleep in the morning. Her driver came to pick her up before 1pm but was unable to wake her.   Patient is now fearful that she will fall asleep and not wake up.  Patient crying and concerned that she will not be seen as a patient again since she missed an appointment.  She also has never received the xifaxan medication that was approved in January.  Informed Vallery Sa, MSN, NP-C of patient's concerns.  She will contact pharmacy about the xifaxan Rx.  Instructed patient to call the [facility] and reschedule her appointment.

## 2017-11-27 ENCOUNTER — Ambulatory Visit: Admit: 2017-11-27 | Discharge: 2017-11-27 | Payer: MEDICARE

## 2017-11-27 DIAGNOSIS — R0602 Shortness of breath: Secondary | ICD-10-CM

## 2017-11-27 DIAGNOSIS — K769 Liver disease, unspecified: Principal | ICD-10-CM

## 2017-11-27 DIAGNOSIS — J948 Other specified pleural conditions: Principal | ICD-10-CM

## 2017-11-27 DIAGNOSIS — J918 Pleural effusion in other conditions classified elsewhere: Secondary | ICD-10-CM

## 2017-11-27 NOTE — Unmapped (Signed)
HPI: [name] is a 52 y.o. female who will undergo right thoracentesis.    Allergies:   Allergies   Allergen Reactions   ??? Sulfa (Sulfonamide Antibiotics) Hives     Historic from childhood   ??? Cymbalta [Duloxetine] Itching       Medications:   No current facility-administered medications for this encounter.        PMH:   Past Medical History:   Diagnosis Date   ??? Anemia    ??? Cirrhosis (CMS-HCC)    ??? Heart murmur        ASA Grade: ASA 3 - Patient with moderate systemic disease with functional limitations    ROS:  General: Denies fever or chills.  Cardiovascular: Denies chest pain.   Pulmonary: Denies shortness of breath, snoring, sleep apnea, or respiratory infection.    --This procedure has been fully reviewed with the patient/patient???s authorized representative. The risks, benefits and alternatives have been explained, and the patient/patient???s authorized representative has consented to the procedure.  --The patient will accept blood products in an emergent situation.  --The patient does not have a Do Not Resuscitate order in effect.          PE:    Vitals:    11/27/17 1512   BP: 113/62   Pulse: 88   Resp: 20   SpO2: 100%     General: WD, WN female in NAD.  Airway assessment: Class 3 - Can visualize soft palate  Cardiovascular:  Regular rate and rhythm.   Lungs: Respirations nonlabored      Assessment/Plan:    [name] is a 52 y.o. female who will undergo right thoracentesis for hepatohydrothorax in Interventional Radiology.

## 2017-11-28 NOTE — Unmapped (Signed)
[facility]   POST-PROCEDURE NOTE     Patient: Darlene Thompson  DOB: [birth year]  Medical Record Number: [id number] Note Date / Time: November 27, 2017 4:37 PM     Procedure     Procedure: Thoracentesis    Post Procedure Diagnosis:  hepatohydrothorax    Attending: Terrilyn Saver     Fellow/Resident: none    Complications: none    Access: percutaneous    Closure: none    EBL: none    Samples:  none    BRIEF DESCRIPTION:  Large right effusion. Successful removal of 1.5 L.  Additional fluid not removed due to history of trapped lung    PLAN: CXR today    Full report to follow.      Claretta Fraise, MD     November 27, 2017 4:37 PM

## 2017-11-29 MED FILL — XIFAXAN/550MG/TABS: XIFAXAN/550MG/TABS | 30 days supply | Qty: 60 | Fill #1

## 2017-11-29 NOTE — Unmapped (Signed)
Opened in error

## 2017-11-30 ENCOUNTER — Telehealth: Payer: Self-pay

## 2017-11-30 NOTE — Telephone Encounter (Signed)
Patient is requesting test strips and lancets. Patient was prescribed a glucometer to check once daily if feeling lightheaded. Dx: E16.2

## 2017-11-30 NOTE — Telephone Encounter (Signed)
Called and left message to return call to ask what meter she has so I can send in strips [facility]ets [facility], ok for pec to speak to patient

## 2017-12-01 ENCOUNTER — Ambulatory Visit: Admit: 2017-12-01 | Discharge: 2017-12-02 | Payer: MEDICARE

## 2017-12-01 DIAGNOSIS — Z95828 Presence of other vascular implants and grafts: Principal | ICD-10-CM

## 2017-12-01 NOTE — Telephone Encounter (Signed)
Patient states she did not receive a meter, informed her the pharmacy should have this and to check with her pharmacy

## 2017-12-04 MED ORDER — GLUCOSE BLOOD VI STRP
ORAL_STRIP | 12 refills | Status: AC
Start: 2017-12-04 — End: ?

## 2017-12-04 MED ORDER — ACCU-CHEK FASTCLIX LANCETS MISC: 3 refills | Status: AC

## 2017-12-04 MED ORDER — SPIRONOLACTONE 100 MG TABLET
ORAL_TABLET | Freq: Every day | ORAL | 5 refills | 0 days | Status: CP
Start: 2017-12-04 — End: 2018-07-07

## 2017-12-04 MED ORDER — FUROSEMIDE 40 MG TABLET
ORAL_TABLET | Freq: Every day | ORAL | 11 refills | 0.00000 days | Status: CP
Start: 2017-12-04 — End: 2018-12-19

## 2017-12-04 NOTE — Addendum Note (Signed)
Addended by: Inetta FermoHENDRICKS, Trenden Hazelrigg S on: 12/04/2017 04:41 PM   Modules accepted: Orders

## 2017-12-04 NOTE — Telephone Encounter (Signed)
Relation to pt: self  Call back number: [phone] (H) Pharmacy: [id number] - [place], [place] - [address] [phone] (Phone) [phone] (Fax)     Reason for call:  Patient confirmed pharmacy received accu chek meter and would like lancets and test strips sent in today. Patient mentioned she's complexly out of spironolactone (ALDACTONE) 100 MG tablet, please advise

## 2017-12-04 NOTE — Addendum Note (Signed)
Addended by: Glori LuisSONNENBERG, Paisleigh Maroney G on: 12/04/2017 05:57 PM   Modules accepted: Orders

## 2017-12-04 NOTE — Unmapped (Signed)
Patient called to report that she had a thoracentesis done last week and that 1.5 L fluid removed.  She is still having difficulty breathing.   She also weights ~190 lbs and she thought the TIPS shunt was supposed to stop the fluid accumulation.   Informed her that the TIPS takes a 6-8 weeks to mature. Abd US done on 3-8- 19 showed no ascites.  She says she feels miserable and wants to know what to do.   She ran out of spironolactone ??? will send refill.   E-mail to Vallery Sa, MSN, NP-C

## 2017-12-04 NOTE — Telephone Encounter (Signed)
Her spironolactone needs to come from her GI office. It does not appear that this has been filled in our system in >1 year. Lancets and test strips sent to pharmcy.

## 2017-12-05 NOTE — Telephone Encounter (Signed)
Left message to return call, ok for pec to speak to patient and notify patient the spironolactone should come form her GI doctor.

## 2017-12-07 ENCOUNTER — Ambulatory Visit: Payer: Self-pay

## 2017-12-07 NOTE — Telephone Encounter (Signed)
Patient called in for information. She says "I've been dealing with feeling like I'm going to faint for a while now. Dr. Birdie SonsSonnenberg told me to check my blood sugars fasting and keep record to see if this is the cause. I just got the glucometer yesterday, so I checked it before eating or drinking this morning and it was 71. My question is what is the range for a normal blood sugar?" I advised the normal range for glucose on lab work is 70-99 and that hers is near the low end of normal. I asked does she have a snack before bed, she says "sometimes I eat an apple and drink pickle juice to ward off cramps in my body." I advised her to try eating a snack before bed like peanut butter crackers or a sandwich, then monitor her blood sugars in the morning as soon as she wakes up. Keep a record of the morning blood sugars and maybe some afternoon readings. Advised to call the office back for an appointment if the symptoms she's having are not getting better, she verbalized understanding.   Reason for Disposition . General information question, no triage required and triager able to answer question  Answer Assessment - Initial Assessment Questions 1. REASON FOR CALL or QUESTION: "What is your reason for calling today?" or "How can I best help you?" or "What question do you have that I can help answer?"     What is the range for your blood sugar?  Protocols used: INFORMATION ONLY CALL-A-AH

## 2017-12-08 NOTE — Telephone Encounter (Signed)
Patient notified and states she received a refill. Patient states her fasting glucose was 71 and after lunch it was 79. Patient states her glucose last night was 48. She states this morning she tested it fasting and it was in the low 80's. Patient states it is all over the place.

## 2017-12-09 NOTE — Telephone Encounter (Signed)
CBGs appear to be low normal with the exception of the one low overnight.  Certainly this could be related to 1 of her medicines and I will see if [place] CitizenCaroline can review her medications to see if any of them could be identified as a cause of hypoglycemia.  She should also try eating a snack before bed to see if that would help with the low nighttime sugars.  Thanks.

## 2017-12-11 ENCOUNTER — Ambulatory Visit: Admit: 2017-12-11 | Discharge: 2017-12-14 | Disposition: A | Discharge status: Home / Self Care | Payer: MEDICARE

## 2017-12-11 ENCOUNTER — Encounter: Admit: 2017-12-11 | Discharge: 2017-12-14 | Disposition: A | Discharge status: Home / Self Care | Payer: MEDICARE

## 2017-12-11 DIAGNOSIS — J90 Pleural effusion, not elsewhere classified: Principal | ICD-10-CM

## 2017-12-11 DIAGNOSIS — K7682 Hepatic encephalopathy: Secondary | ICD-10-CM | POA: Insufficient documentation

## 2017-12-11 LAB — BLOOD GAS CRITICAL CARE PANEL, VENOUS
BASE EXCESS VENOUS: -1.6 (ref -2.0–2.0)
CALCIUM IONIZED VENOUS (MG/DL): 4.56 mg/dL (ref 4.40–5.40)
HCO3 VENOUS: 22 mmol/L (ref 22–27)
LACTATE BLOOD VENOUS: 3.5 mmol/L — ABNORMAL HIGH (ref 0.5–1.8)
O2 SATURATION VENOUS: 75.3 % (ref 40.0–85.0)
PCO2 VENOUS: 30 mmHg — ABNORMAL LOW (ref 40–60)
PH VENOUS: 7.47 — ABNORMAL HIGH (ref 7.32–7.43)
PO2 VENOUS: 42 mmHg (ref 30–55)
POTASSIUM WHOLE BLOOD: 3.8 mmol/L (ref 3.4–4.6)
SODIUM WHOLE BLOOD: 141 mmol/L (ref 135–145)

## 2017-12-11 LAB — COMPREHENSIVE METABOLIC PANEL
ALBUMIN: 3 g/dL — ABNORMAL LOW (ref 3.5–5.0)
ALKALINE PHOSPHATASE: 144 U/L — ABNORMAL HIGH (ref 38–126)
ALT (SGPT): 32 U/L (ref 15–48)
AST (SGOT): 60 U/L — ABNORMAL HIGH (ref 14–38)
BILIRUBIN TOTAL: 1.9 mg/dL — ABNORMAL HIGH (ref 0.0–1.2)
BLOOD UREA NITROGEN: 16 mg/dL (ref 7–21)
BUN / CREAT RATIO: 26
CALCIUM: 8.5 mg/dL (ref 8.5–10.2)
CHLORIDE: 109 mmol/L — ABNORMAL HIGH (ref 98–107)
CO2: 20 mmol/L — ABNORMAL LOW (ref 22.0–30.0)
CREATININE: 0.62 mg/dL (ref 0.60–1.00)
EGFR MDRD AF AMER: >=60 mL/min/{1.73_m2} (ref >=60)
EGFR MDRD NON AF AMER: >=60 mL/min/{1.73_m2} (ref >=60)
GLUCOSE RANDOM: 138 mg/dL (ref 65–179)
POTASSIUM: 3.9 mmol/L (ref 3.5–5.0)
PROTEIN TOTAL: 6.5 g/dL (ref 6.5–8.3)
SODIUM: 140 mmol/L (ref 135–145)

## 2017-12-11 LAB — INR: Lab: 1.45

## 2017-12-11 LAB — URINALYSIS WITH CULTURE REFLEX
BACTERIA: NONE SEEN /HPF
BILIRUBIN UA: NEGATIVE
GLUCOSE UA: NEGATIVE
HYALINE CASTS: 5 /LPF — ABNORMAL HIGH (ref 0–1)
LEUKOCYTE ESTERASE UA: NEGATIVE
NITRITE UA: NEGATIVE
RBC UA: 2 /HPF (ref <=4)
SPECIFIC GRAVITY UA: 1.026 (ref 1.003–1.030)
SQUAMOUS EPITHELIAL: 1 /HPF (ref 0–5)
WBC UA: 2 /HPF (ref 0–5)

## 2017-12-11 LAB — AST (SGOT): Aspartate aminotransferase:CCnc:Pt:Ser/Plas:Qn:: 60 — ABNORMAL HIGH

## 2017-12-11 LAB — CBC W/ AUTO DIFF
BASOPHILS ABSOLUTE COUNT: 0 10*9/L (ref 0.0–0.1)
BASOPHILS RELATIVE PERCENT: 0.2 %
EOSINOPHILS ABSOLUTE COUNT: 0 10*9/L (ref 0.0–0.4)
EOSINOPHILS RELATIVE PERCENT: 0.1 %
HEMATOCRIT: 38.6 % (ref 36.0–46.0)
LARGE UNSTAINED CELLS: 1 % (ref 0–4)
LYMPHOCYTES ABSOLUTE COUNT: 0.3 10*9/L — ABNORMAL LOW (ref 1.5–5.0)
LYMPHOCYTES RELATIVE PERCENT: 3.1 %
MEAN CORPUSCULAR HEMOGLOBIN CONC: 33.1 g/dL (ref 31.0–37.0)
MEAN CORPUSCULAR HEMOGLOBIN: 29.1 pg (ref 26.0–34.0)
MEAN CORPUSCULAR VOLUME: 88 fL (ref 80.0–100.0)
MEAN PLATELET VOLUME: 7.5 fL (ref 7.0–10.0)
MONOCYTES ABSOLUTE COUNT: 0.3 10*9/L (ref 0.2–0.8)
MONOCYTES RELATIVE PERCENT: 3.5 %
NEUTROPHILS RELATIVE PERCENT: 92 %
PLATELET COUNT: 209 10*9/L (ref 150–440)
RED BLOOD CELL COUNT: 4.38 10*12/L (ref 4.00–5.20)
RED CELL DISTRIBUTION WIDTH: 14 % (ref 12.0–15.0)

## 2017-12-11 LAB — AMMONIA: Ammonia:SCnc:Pt:Plas:Qn:: 232 — ABNORMAL HIGH

## 2017-12-11 LAB — HEPARIN CORRELATION: Lab: 0.2

## 2017-12-11 LAB — SALICYLATE: Salicylates:MCnc:Pt:Ser/Plas:Qn:: <10

## 2017-12-11 LAB — BETA HCG QUANTITATIVE: Choriogonadotropin.beta subunit:ACnc:Pt:Ser/Plas:Qn:: <5

## 2017-12-11 LAB — GLUCOSE WHOLE BLOOD: Glucose:MCnc:Pt:Bld:Qn:: 146

## 2017-12-11 LAB — ETHANOL: Ethanol:MCnc:Pt:Ser/Plas:Qn:GC: <10

## 2017-12-11 LAB — MONOCYTES ABSOLUTE COUNT: Lab: 0.3

## 2017-12-11 LAB — TOXICOLOGY SCREEN, URINE
AMPHETAMINE SCREEN URINE: <500
CANNABINOID SCREEN URINE: <20
COCAINE(METAB.)SCREEN, URINE: <150
METHADONE SCREEN, URINE: <300

## 2017-12-11 LAB — PREGNANCY TEST URINE: Lab: NEGATIVE

## 2017-12-11 LAB — APTT: APTT: 37.8 s — ABNORMAL HIGH (ref 27.7–37.7)

## 2017-12-11 LAB — WBC UA: Lab: 2

## 2017-12-11 LAB — MAGNESIUM: Magnesium:MCnc:Pt:Ser/Plas:Qn:: 1.9

## 2017-12-11 LAB — LACTATE BLOOD VENOUS: Lactate:SCnc:Pt:BldV:Qn:: 3.1 — ABNORMAL HIGH

## 2017-12-11 LAB — LIPASE: Triacylglycerol lipase:CCnc:Pt:Ser/Plas:Qn:: 130

## 2017-12-11 LAB — METHADONE SCREEN, URINE: Lab: <300

## 2017-12-11 LAB — ACETAMINOPHEN LEVEL: Acetaminophen:MCnc:Pt:Ser/Plas:Qn:: <10

## 2017-12-11 LAB — TROPONIN I: Troponin I.cardiac:MCnc:Pt:Ser/Plas:Qn:: <0.034

## 2017-12-11 NOTE — Telephone Encounter (Signed)
Left message to return call, ok for pec to speak to patient about message below 

## 2017-12-11 NOTE — Unmapped (Signed)
ED Attending Transfer Note    I received sign out from prior ED attending. Darlene Thompson is a 52 y.o. female known hx of cirrhosis, prior TIPS procedure. Patient protecting airway, stably SpO2 and RR. I have reviewed the current lab values including Amonia level. Exam consistent w/ hepatic encephalopathy. Will place NG tube and facilitate Lactulose treatment. Will discuss w/ MICU pending admission.    Past Medical History:   Diagnosis Date   ??? Anemia    ??? Cirrhosis (CMS-HCC)    ??? Heart murmur        Critical Care  Performed by: Laurance Flatten, MD  Authorized by: Laurance Flatten, MD     Critical care provider statement:     Critical care time (minutes):  35    Critical care time was exclusive of:  Separately billable procedures and treating other patients and teaching time    Critical care was necessary to treat or prevent imminent or life-threatening deterioration of the following conditions:  CNS failure or compromise    Critical care was time spent personally by me on the following activities:  Discussions with consultants, examination of patient, pulse oximetry, ordering and review of laboratory studies, ordering and performing treatments and interventions, re-evaluation of patient's condition, review of old charts and gastric intubation    I assumed direction of critical care for this patient from another provider in my specialty: yes

## 2017-12-11 NOTE — Unmapped (Signed)
[facility]  Emergency Department Provider Note      ED Clinical Impression     Final diagnoses:   Altered mental status, unspecified altered mental status type (Primary)   Hepatic encephalopathy (CMS-HCC)       Initial Impression, ED Course, Assessment and Plan     Impression: Altered mental status, hepatic encephalopathy, right pleural effusion    Patient with history of alcoholic cirrhosis followed by [facility] hepatology with TIPS procedure 10/10/17 for recurrent hepatic hydrothorax requiring frequent thoracenteses.  TIPS Discharge summary reports no complications at that time.  Discharged postop day 1 on 10/11/2017.  Patient was discharged on spironolactone 100 mg twice daily and furosemide 60 mg twice daily for diuresis.    I spoke with the patient's sister over the phone, who lives with the patient.  Patient was normal, communicative 2 days ago.  Yesterday, patient reported it was hard to breathe and spent the entire day in her room.  She also reported a mild headache to her sister, took Tylenol (2 at a time).  Sister also noted she was complaining of nausea with at least one episode of nonbilious nonbloody emesis.  Today, patient was noted to be severely altered, similar to previous spells when her ammonia is high.  She is not communicating, only screaming.  Sister is not suspicious of overdose with Tylenol or salicylate.  Of note, patient has had a weight gain of approximately 30 pounds over the past 4 days.    Patient maintaining airway, breath sounds diminished over right base.  Saturating 97-99% in room air.  VBG does not indicate acute airway intervention.    GCS 9: Opens eyes to painful stimuli, screams to painful stimuli. Localizes to painful stimuli.    Labs show ammonia 232.  Suspect hepatic encephalopathy.  Chest x-ray and point-of-care ultrasound show large right pleural effusion, suspect hepatic hydrothorax as she has history of this.      Bicarb of 20, lactate of 3.5.    CT head unremarkable. Chest x-ray with large right pleural effusion.  EKG    NG tube with first dose of lactulose in the emergency department.  Anticipate admission to you.    4:12 PM  Spoke with MAO and paged MICU.    4:30 PM  I spoke with the MICU resident, Florentina Addison, over the phone who spoke with her fellow.  I explained that the patient is so altered she is requiring soft restraints, noncommunicative and hepatic encephalopathy is complicated by large right pleural effusion.  Patient is saturating greater than 97% in room air, we do not anticipate the necessity of airway intervention in the next couple hours.  Given this, MICU recommends admission to stepdown.  I will speak with my attending, the MAO and page stepdown at this time.    4:40 PM  I spoke with the medical stepdown resident, who agrees to come evaluate the patient.    On reassessment, patient continues to protect her airway.  Repeat temperature patient is 36.6, patient has been afebrile heart rate stable around 105 blood pressure stable 130s/80s.  Saturating 97% room air.  NG tube in place, patient got first dose of lactulose.  In soft restraints given acutely altered mental status and need for NG tube for lactulose administration.    Admitting team at bedside.      Additional Medical Decision Making     I have reviewed the vital signs and the nursing notes. Labs and radiology results that were available during my  care of the patient were independently reviewed by me and considered in my medical decision making.     I staffed the case with the ED attending, Dr. Lavonia Drafts and Dr. Magdalene Molly.    Portions of this record have been created using Scientist, clinical (histocompatibility and immunogenetics). Dictation errors have been sought, but may not have been identified and corrected.  ____________________________________________       History     Chief Complaint  Altered Mental Status      HPI   Darlene Thompson is a 52 y.o. female history of alcoholic cirrhosis with recurrent hepatic hydrothorax who is brought in by EMS with acutely altered mental status.  Patient is followed by [facility] hepatology with TIPS procedure 10/10/17, discharged on spironolactone and furosemide.    EMS reports borderline tachycardia, maintaining blood pressures, last blood pressure via EMS was 140/90.  In speaking with sister, patient was last normal 2 days ago.  Yesterday, patient with progressive dyspnea, nausea and at least one episode of nonbilious nonbloody patient also complained of headache yesterday and took Tylenol.  She has had a 30 pound weight gain over 4 days, per sister.  Today, sister called EMS because patient was not communicative, not following commands, screaming instead of using words.  Low concern for overdose from sister.  No report of fevers or complaints of chest pain.    Past Medical History:   Diagnosis Date   ??? Anemia    ??? Cirrhosis (CMS-HCC)    ??? Heart murmur        Patient Active Problem List   Diagnosis   ??? Hydrothorax   ??? Cirrhosis (CMS-HCC)   ??? Macrocytic anemia   ??? Anasarca   ??? BMI 38.0-38.9,adult   ??? LLQ pain   ??? Shortness of breath   ??? Ulcer of right lower extremity (CMS-HCC)   ??? Alcoholic cirrhosis of liver with ascites (CMS-HCC)   ??? Anemia   ??? Postprocedural pneumothorax   ??? Headache   ??? Skin ulcer of left calf with fat layer exposed (CMS-HCC)   ??? Peripheral edema   ??? S/P TIPS (transjugular intrahepatic portosystemic shunt)       Past Surgical History:   Procedure Laterality Date   ??? CESAREAN SECTION  1989   ??? CHOLECYSTECTOMY  1993   ??? GASTRIC BYPASS  2009   ??? IR TIPS  10/10/2017    IR TIPS 10/10/2017 Rush Barer, MD IMG VIR [facility]   ??? PR DEBRIDEMENT, SKIN, SUB-Q TISSUE,MUSCLE,BONE,=<20 SQ CM Right 10/24/2016    Procedure: RLE WD DEBRIDEMENT;  Surgeon: Boykin Reaper, MD;  Location: MAIN OR [facility];  Service: Vascular         Current Facility-Administered Medications:   ???  lactated ringers bolus 1,000 mL, 1,000 mL, Intravenous, Once, Robbi Garter, MD, 1,000 mL at 12/11/17 1610  ???  lactulose (CEPHULAC) packet 20 g, 20 g, Oral, Once, Robbi Garter, MD    Current Outpatient Prescriptions:   ???  acetaminophen (TYLENOL) 500 MG tablet, Take 500 mg by mouth every six (6) hours as needed. , Disp: , Rfl:   ???  b complex vitamins tablet, Take 1 tablet by mouth daily., Disp: , Rfl:   ???  BABY OIL, MINERAL OIL, TOP, Apply topically., Disp: , Rfl:   ???  ciprofloxacin HCl (CIPRO) 500 MG tablet, Take 1 tablet (500 mg total) by mouth daily., Disp: 30 tablet, Rfl: 0  ???  cyanocobalamin 1000 MCG tablet, Take 1,000 mcg by mouth daily  as needed. , Disp: , Rfl:   ???  diphenhydramine HCl (BENADRYL ORAL), Take by mouth nightly as needed. 1-2 cap as needed, Disp: , Rfl:   ???  ferrous sulfate 325 (65 FE) MG tablet, Take 325 mg by mouth daily with breakfast., Disp: , Rfl:   ???  folic acid (FOLVITE) 1 MG tablet, Take 1 tablet (1 mg total) by mouth daily., Disp: 30 tablet, Rfl: 5  ???  furosemide (LASIX) 40 MG tablet, Take 1 tablet (40 mg total) by mouth daily., Disp: 30 tablet, Rfl: 11  ???  gabapentin (NEURONTIN) 100 MG capsule, Take 2 capsules (200 mg total) by mouth nightly as needed (pain)., Disp: 60 capsule, Rfl: 0  ???  gentamicin (GARAMYCIN) 0.1 % cream, Apply 1 application topically daily. (Patient not taking: Reported on 10/20/2017), Disp: 30 g, Rfl: 6  ???  lactulose (CHRONULAC) 10 gram/15 mL solution, Take 30 mL (20 g total) by mouth Three (3) times a day., Disp: 2700 mL, Rfl: 11  ???  ondansetron (ZOFRAN-ODT) 4 MG disintegrating tablet, Take 1 tablet (4 mg total) by mouth every six (6) hours as needed., Disp: 30 tablet, Rfl: 0  ???  potassium 99 mg Tab, Take 99 mg by mouth Two (2) times a week., Disp: , Rfl:   ???  rifAXIMin (XIFAXAN) 550 mg Tab, Take 1 tablet (550 mg total) by mouth Two (2) times a day., Disp: 180 tablet, Rfl: 4  ???  spironolactone (ALDACTONE) 100 MG tablet, Take 2 tablets (200 mg total) by mouth daily., Disp: 60 tablet, Rfl: 5    Allergies  Sulfa (sulfonamide antibiotics) and Cymbalta [duloxetine]    Family History Problem Relation Age of Onset   ??? Diabetes type II Mother    ??? Heart failure Mother    ??? Diabetes type II Father    ??? Heart failure Father    ??? COPD Father        Social History  Social History   Substance Use Topics   ??? Smoking status: Never Smoker   ??? Smokeless tobacco: Never Used   ??? Alcohol use No       Review of Systems:  All other systems have been reviewed and are negative except as otherwise documented in the HPI.      Physical Exam     ED Triage Vitals [12/11/17 1422]   Enc Vitals Group      BP       Heart Rate 107      SpO2 Pulse       Resp 10      Temp       Temp src       SpO2 97 %   BP 139/87  - Pulse 107  - Temp 36.6 ??C (97.8 ??F) (Oral)  - Resp 10  - LMP 04/23/2016 (Approximate)  - SpO2 97%     Constitutional: Patient acutely altered, not responding to voice.  Protecting airway.  Screams to sternal rub, localizes to painful stimuli in all 4 extremities.  When arm is dropped about patient's head, she protects her face.  Eyes: Pupils are 4 mm, equal and reactive.   HEENT:       Head: Normocephalic and atraumatic.        Nose: No epistaxis or congestion.       Mouth/Throat: Clear oropharynx.  Tacky mucous membranes.  Small upper lip laceration, no active bleeding.  No stertor.       Neck: full neck flexion and extension,  no masses, no cervical lymphadenopathy.  Managing secretions, no stridor.  Cardiovascular: Borderline tachycardia with regular rhythm. 2+ and symmetric radial pulses.  Capillary refill less than 2 seconds in distal extremities.  Respiratory: Mild increased work of breathing with diminished breath sounds over the right base.  No wheezes or rhonchi.    Gastrointestinal: Obese, soft, non-tender, non-distended.  Musculoskeletal: Significant bilateral lower extremity edema, symmetric.  Neurologic: GCS = 9 (2/2/5) screams to sternal rub.  Doll's eyes reflex intact.  Does not drop hand on face.  Localizes to painful stimuli in bilateral upper extremities, triple flexes to painful stimuli in bilateral lower extremities.  No significant increase in muscle tone, brisk patellar reflexes that are symmetric.  Skin: Skin is warm, dry and intact. No acute rash noted.      EKG     Sinus tachycardia, regular rhythm.  PR interval within normal limits, QRS of 110 ms, QTC of 515 ms.    Radiology     XR Abdomen 1 View   Final Result   Partially imaged enteric tube with tip terminating in the proximal small bowel, possibly jejunum due to history of gastric bypass surgery.      ED POCUS [facility]USH Protocol Emergency         XR Chest 1 view Portable   Final Result      CT Head Wo Contrast   Final Result   No acute intracranial abnormality.      XR Abdomen 1 View    (Results Pending)               Robbi Garter, MD  Resident  12/11/17 [phone]

## 2017-12-11 NOTE — Unmapped (Signed)
Patient transported to CT Scan  Transported by Radiology  How tranported Stretcher  Cardiac Monitor yes

## 2017-12-11 NOTE — Unmapped (Signed)
Patient rounds completed. Pt is confused, unresponsive to questions or commands, but resting in no apparent distress on stretcher. No requests or complaints at this time. Bed in lowest position and locked; call bell and patient belongings within reach.

## 2017-12-11 NOTE — Unmapped (Signed)
Bed: 99-B  Expected date:   Expected time:   Means of arrival:   Comments:  ems

## 2017-12-11 NOTE — Unmapped (Signed)
Pt here via [place] em[place] for eval of decreased responsiveness per family.  Hx of cirrhosis and family reported at scene that she has acted like this previously when ammonia high.

## 2017-12-12 DIAGNOSIS — J90 Pleural effusion, not elsewhere classified: Principal | ICD-10-CM

## 2017-12-12 LAB — COMPREHENSIVE METABOLIC PANEL
ALBUMIN: 2.7 g/dL — ABNORMAL LOW (ref 3.5–5.0)
ALT (SGPT): 32 U/L (ref 15–48)
ANION GAP: 9 mmol/L (ref 9–15)
AST (SGOT): 61 U/L — ABNORMAL HIGH (ref 14–38)
BILIRUBIN TOTAL: 1.3 mg/dL — ABNORMAL HIGH (ref 0.0–1.2)
BLOOD UREA NITROGEN: 14 mg/dL (ref 7–21)
BUN / CREAT RATIO: 26
CALCIUM: 8.7 mg/dL (ref 8.5–10.2)
CHLORIDE: 114 mmol/L — ABNORMAL HIGH (ref 98–107)
CO2: 20 mmol/L — ABNORMAL LOW (ref 22.0–30.0)
CREATININE: 0.53 mg/dL — ABNORMAL LOW (ref 0.60–1.00)
EGFR MDRD AF AMER: >=60 mL/min/{1.73_m2} (ref >=60)
EGFR MDRD NON AF AMER: >=60 mL/min/{1.73_m2} (ref >=60)
GLUCOSE RANDOM: 84 mg/dL (ref 65–179)
POTASSIUM: 3.5 mmol/L (ref 3.5–5.0)
PROTEIN TOTAL: 5.9 g/dL — ABNORMAL LOW (ref 6.5–8.3)
SODIUM: 143 mmol/L (ref 135–145)

## 2017-12-12 LAB — CBC W/ AUTO DIFF
BASOPHILS ABSOLUTE COUNT: 0 10*9/L (ref 0.0–0.1)
BASOPHILS RELATIVE PERCENT: 0.2 %
EOSINOPHILS ABSOLUTE COUNT: 0.1 10*9/L (ref 0.0–0.4)
EOSINOPHILS RELATIVE PERCENT: 1 %
HEMATOCRIT: 36.7 % (ref 36.0–46.0)
HEMOGLOBIN: 11.6 g/dL — ABNORMAL LOW (ref 12.0–16.0)
LARGE UNSTAINED CELLS: 4 % (ref 0–4)
LYMPHOCYTES ABSOLUTE COUNT: 1.4 10*9/L — ABNORMAL LOW (ref 1.5–5.0)
MEAN CORPUSCULAR HEMOGLOBIN CONC: 31.7 g/dL (ref 31.0–37.0)
MEAN CORPUSCULAR HEMOGLOBIN: 28 pg (ref 26.0–34.0)
MEAN CORPUSCULAR VOLUME: 88.5 fL (ref 80.0–100.0)
MEAN PLATELET VOLUME: 7.5 fL (ref 7.0–10.0)
MONOCYTES ABSOLUTE COUNT: 0.5 10*9/L (ref 0.2–0.8)
MONOCYTES RELATIVE PERCENT: 6.8 %
NEUTROPHILS ABSOLUTE COUNT: 5.2 10*9/L (ref 2.0–7.5)
NEUTROPHILS RELATIVE PERCENT: 69.4 %
PLATELET COUNT: 221 10*9/L (ref 150–440)
RED BLOOD CELL COUNT: 4.15 10*12/L (ref 4.00–5.20)
RED CELL DISTRIBUTION WIDTH: 14.1 % (ref 12.0–15.0)
WBC ADJUSTED: 7.5 10*9/L (ref 4.5–11.0)

## 2017-12-12 LAB — PROTIME: Lab: 16.3 — ABNORMAL HIGH

## 2017-12-12 LAB — MAGNESIUM: Magnesium:MCnc:Pt:Ser/Plas:Qn:: 1.7

## 2017-12-12 LAB — HYPOCHROMIA

## 2017-12-12 LAB — PROTIME-INR: INR: 1.43

## 2017-12-12 LAB — PROTEIN TOTAL: Protein:MCnc:Pt:Ser/Plas:Qn:: 5.9 — ABNORMAL LOW

## 2017-12-12 NOTE — Unmapped (Signed)
Daily Progress Note    Assessment/Plan:    Principal Problem:    Hepatic encephalopathy (CMS-HCC)  Active Problems:    Hydrothorax    Alcoholic cirrhosis of liver with ascites (CMS-HCC)    S/P TIPS (transjugular intrahepatic portosystemic shunt)  Resolved Problems:    * No resolved hospital problems. *        Pressure Ulcer(s)    Active Pressure Ulcer     None                 Darlene Thompson is a 52 y.o. female with PMHx decompensated cirrhosis and recurrent hepatic hydrothorax s/p TIPS 10/10/2016 that presented to [facility] with AMS most likely 2/2 Hepatic Encephalopathy.    AMS 2/2 Hepatic Encephalopathy:  Patient presented with AMS and impaired neuromuscular dysfunction on 3/18 with Ammonia of 232 and no other metabolic derangements. TIPS procedure noted 10/10/2017. Chart review and patient family both indicate trouble adhering to lactulose and rifaximin medication regimen. CT head did not indicate any acute intracranial abnormality. HE likely due to medical noncompliance + TIPS. Low suspicion for infectious etiology (nml wct, afebrile), lactate is downtrending 3.1 from 3.5 on 3/18. UA unremarkable. Will follow up Bcx. Lactulose treatment in ED improved GCS from 6 back to 9 morning of 3/19. Patient has had multiple bowel movements. Consulted Hepatology, appreciate any recommendations.Several bowel movements later in the morning,  Pt agitated but not obtunded, clinically improving with lactulose.  -S/p lactulose 20g q2 and enema, cont home Lactulose 30g TID  -Rifaximin 550 mg po BID  -Follow Up BCx 2x 3/18  -500cc bolus IVF  -Awaiting recommendations from Hepatology    Recurrent hepatic hydrothorax:  Patient has had multiple thoracentesis to drain recurrent hydrothorax. GI on last visit notes multiple procedures may be needed before TIPS procedure begins to reduce fluid volume. Patient is afebrile, no leukocytosis on presentation today. Moderate to large right pleural effusion compatible with hepatic hydrothorax on CXR. Consulted [facility] for thoracentesis but unable to perform procedure due to insufficient fluid pocket / fluctuating mental status made procedure unsafe.  -Holding home diuretic regimen with spironolactone 200mg  and lasix 40mg  given patient may be volume down    Alcoholic Cirrhosis:   Denies EtOH use since My 2017. Liver synthetic function has slowly improved since then. Complicated by ascites (previously required paracentesis and hydrothorax. No known varices. Vaccinated for hep A but not B. MELD 13 in 12/11/17 at 19:15. Unable to perform paracentesis as per above.  - Holding diuretic regimen Spironolactone 200mg  and lasix 40mg  for fluid overload  - Holding Ciprofloxacin for SBP prophylasix given QTc elongation on EKG  - Awaiting recommendations from Hepatology     Macrocytic Anemia: Cont home B12 and iron    Chronic Pain: Holding home gabapentin and tramadol given AMS, tylenol PRN    GERD: Protonox daily    Global  - FEN/GI: NPO  - PPx: Protonox  - DVT ppx: lovenox  - Code Status: FULL CODE  - Dispo: [facility]  ___________________________________________________________________    Subjective:  No acute events overnight, mental status improving from obtunded to vocalizing and responding to painful stimuli    Labs/Studies:  Labs and Studies from the last 24hrs per EMR and Reviewed    Objective:  BP 156/72  - Pulse 99  - Temp 36.7 ??C (98.1 ??F) (Oral)  - Resp 19  - LMP 04/23/2016 (Approximate)  - SpO2 98%     Physical Exam:  General: Moves head spontaneously, vocalizes spontaneously  CV:  Tachycardic, normal S1S2, regular rhythm, no m/r/g  PULM: No increased work of breathing. No cyanosis or clubbing. Mild crackles on right lower lung base  ABD: NT/ND, normoactive bowel sounds, hepatomegaly noted  NEURO: Responsive to painful stimuli, Opens eyes to painful stimuli, tracks eyes with name.   PSYCH: Oriented x0  SKIN: Dusky lesions noted on RLE near shin. 2+ LE edema bilaterally up to knees       Matthew Saras, MS3    I attest that I have reviewed the student note and that the components of the history of the present illness, the physical exam, and the assessment and plan documented were performed by me or were performed in my presence by the student where I verified the documentation and performed (or re-performed) the exam and medical decision making.   Tawnya Crook, MD
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
## 2017-12-12 NOTE — Unmapped (Signed)
Patient rounds completed. Med U team is evaluating pt at bedside. Pt remains confused, slightly agitated, and does not respond to questions or commands. Bed in lowest position and locked; call bell and patient belongings within reach.

## 2017-12-12 NOTE — Unmapped (Signed)
Care Management  Initial Transition Planning Assessment    Per H&P: Darlene Thompson is a 52 y.o. female with PMHx as reviewed in the EMR who presented to [facility] with AMS              General  Care Manager assessed the patient by : Telephone conversation with family, Medical record review, Discussion with Clinical Care team (Pt unable to participate in assessment due to encephalopathy. SPoke with sister June Beck by phone [phone]))  Orientation Level:  (not assessed)    Contact/Decision Maker:    Extended Emergency Contact Information  Primary Emergency Contact: Gust Brooms [place]  Home Phone: [phone]  Mobile Phone: [phone]  Relation: Sister  Secondary Emergency Contact: Webb Silversmith [place]  Mobile Phone: [phone]  Relation: Friend           Advance Directive (Medical Treatment)  Does patient have an advance directive covering medical treatment?: Unable to assess (Pt. cognitively impaired, and/or unaccompanied).  Surrogate decision maker appointed:: No  Information provided on advance directive:: No  Patient requests assistance:: No    Advance Directive (Mental Health Treatment)  Does patient have an advance directive covering mental health treatment?: Unable to assess (Pt. cognitively impaired, and/or unaccompanied).    Patient Information:    Lives with: Children, Family members. Pt lives with her sister (June), June's husband and daughter in Social [place] and June's grandchild. Pt's son is in the home and June is caring for him while pt is hospitalized    Type of Residence: Private residence       Type of Residence: Mailing Address:  [address]  [address]  [place] [address]  Patient Phone Number: [phone] (home)           Medical Provider(s): ERIC Gelene Mink, MD  Reason for Admission: Admitting Diagnosis:  Hepatic encephalopathy (CMS-HCC) [K72.90]  Recurrent right pleural effusion [J90]  Altered mental status, unspecified altered mental status type [R41.82]  Past Medical History:   has a past medical history of Anemia; Cirrhosis (CMS-HCC); and Heart murmur.  Past Surgical History:   has a past surgical history that includes Gastric bypass (2009); Cholecystectomy (1993); Cesarean section (1989); pr debridement, skin, sub-q tissue,muscle,bone,=<20 sq cm (Right, 10/24/2016); and IR Tips (10/10/2017).   Previous admit date: 10/10/2017    Primary Insurance- Payor: MEDICAID [place] / Plan: MEDICAID [place] ACCESS / Product Type: *No Product type* /   Secondary Insurance ??? None  Prescription Coverage ??? Yes  Preferred Pharmacy - [id number] - [place], [place] - [address]  [id number] - [place] ([place]), [place] - [address]  [facility] - [place], [place] - [address]  [facility] - [place], [place] - [address]    Transportation home: Private vehicle  Level of function prior to admission: Independent      Support Systems: Children, Family Members, Significant Other    Responsibilities/Dependents at home?: Yes (Describe) (14 you son)    Home Care services in place prior to admission?: No        Equipment Currently Used at Home: walker, rolling       Currently receiving outpatient dialysis?: No       Financial Information:          Need for financial assistance?: No       Discharge Needs Assessment:    Concerns to be Addressed: care coordination/care  conferences, discharge planning    Clinical Risk Factors: Multiple Diagnoses (Chronic), Poor Health Literacy    Barriers to taking medications: No         Readmission Within the Last 30 Days: no previous admission in last 30 days         Anticipated Changes Related to Illness:  (TBD)    Equipment Needed After Discharge:  (TBD)    Discharge Facility/Level of Care Needs:  Home with Home health RN    Patient at risk for readmission?: N/A    Discharge Plan:    Screen findings are: Discharge planning needs identified or anticipated (Comment).    Expected Discharge Date: 12/19/17    Expected Transfer from Critical Care: 12/15/17    Patient and/or family were provided with choice of facilities / services that are available and appropriate to meet post hospital care needs?: Yes   List choices in order highest to lowest preferred, if applicable. : No preference per sisiter    Initial Assessment complete?: Yes

## 2017-12-12 NOTE — Unmapped (Signed)
Medicine Procedure Service (MDM) Consultation    Principal Problem:    Hepatic encephalopathy (CMS-HCC)  Active Problems:    Hydrothorax    Alcoholic cirrhosis of liver with ascites (CMS-HCC)    S/P TIPS (transjugular intrahepatic portosystemic shunt)      Darlene Thompson is a 52 y.o. y/o female that presents to [facility] with Hepatic encephalopathy (CMS-HCC).    Patient seen in for consideration of therapeutic paracentesis per request of the primary team.  We performed a bedside ultrasound and images are reproduced below.  We are unable to perform the procedure at this time due to insufficient fluid pocket. Patient was also not able to cooperate or be re-directed, so procedures would be quite risky currently. If a procedure is warranted in the future and her mental status improves, please re-consult.        Thank you for this consultation.  We will sign off, please call Med M at [phone] if the clinical situation changes and we will reevaluate.

## 2017-12-12 NOTE — Unmapped (Signed)
Problem: Non-Violent Restraints  Goal: Patient will remain free of restraint events  Outcome: Progressing    Goal: Patient will remain free of physical injury  Outcome: Progressing      Problem: Patient Care Overview  Goal: Plan of Care Review  Outcome: Progressing  Initially, pt somnolent, withdrawing only from pain. Still drowsy but increasingly alert, at times eye opening to speech. Able to shake head yes or no in response to some questions. Still very agitated, screaming, and unable to follow commands. Bilateral soft wrist restraints maintained. Lap belt added for safety due to attempting to get OOB. One time order for zyprexa given. Q2h lactulose maintained but no BM overnight. UOP good. 700+ mL for shift. Q2h turns maintained. No falls. SQ lovenox for VTE prophylaxis. Will CTM.       12/12/17 0650   OTHER   Plan of Care Reviewed With patient   Plan of Care Review   Progress improving             Problem: Skin Injury Risk (Adult)  Goal: Skin Health and Integrity  Patient will demonstrate the desired outcomes by discharge/transition of care.   Outcome: Progressing   12/12/17 0724   Skin Injury Risk (Adult)   Skin Health and Integrity making progress toward outcome       Problem: Fall Risk (Adult)  Goal: Absence of Fall  Patient will demonstrate the desired outcomes by discharge/transition of care.   Outcome: Progressing   12/12/17 0724   Fall Risk (Adult)   Absence of Fall making progress toward outcome       Problem: Liver Failure, Acute/Chronic (Adult)  Goal: Signs and Symptoms of Listed Potential Problems Will be Absent, Minimized or Managed (Liver Failure, Acute/Chronic)  Signs and symptoms of listed potential problems will be absent, minimized or managed by discharge/transition of care (reference Liver Failure, Acute/Chronic (Adult) CPG).  Outcome: Progressing   12/12/17 0724   Liver Failure, Acute/Chronic (Adult)   Problems Assessed (Liver Failure, Acute/Chronic) all   Problems Present (Liver Failure) fluid/electrolyte imbalance;neurologic deterioration       Problem: Confusion, Acute (Adult)  Goal: Identify Related Risk Factors and Signs and Symptoms  Related risk factors and signs and symptoms are identified upon initiation of Human Response Clinical Practice Guideline (CPG).  Outcome: Progressing   12/12/17 0724   Confusion, Acute (Adult)   Related Risk Factors (Acute Confusion) metabolic abnormalities   Signs and Symptoms (Acute Confusion) acute onset of symptoms;communication disturbed;disorientation;memory disturbed;perceptions disturbed;thought process diminished/disorganized;understanding disturbed;reasoning/planning disturbed;emotional/behavioral disturbances     Goal: Cognitive/Functional Impairments Minimized  Patient will demonstrate the desired outcomes by discharge/transition of care.  Outcome: Progressing   12/12/17 0724   Confusion, Acute (Adult)   Cognitive/Functional Impairments Minimized making progress toward outcome     Goal: Safety  Patient will demonstrate the desired outcomes by discharge/transition of care.  Outcome: Progressing   12/12/17 0724   Confusion, Acute (Adult)   Safety making progress toward outcome       Problem: VTE, DVT and PE (Adult)  Goal: Signs and Symptoms of Listed Potential Problems Will be Absent, Minimized or Managed (VTE, DVT and PE)  Signs and symptoms of listed potential problems will be absent, minimized or managed by discharge/transition of care (reference VTE, DVT and PE (Adult) CPG).  Outcome: Progressing   12/12/17 0724   VTE, DVT and PE (Adult)   Problems Assessed (VTE, DVT, PE) all   Problems Present (VTE, DVT, PE) none

## 2017-12-12 NOTE — Unmapped (Signed)
Pt airway intact, breathing equal and unlabored and skin is appropriate for ethnicity. Pt is in NAD and VSS. pt transported out of ED by this RN.
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
## 2017-12-13 LAB — AMMONIA: Ammonia:SCnc:Pt:Plas:Qn:: 25

## 2017-12-13 LAB — BASIC METABOLIC PANEL
ANION GAP: 5 mmol/L — ABNORMAL LOW (ref 9–15)
ANION GAP: 6 mmol/L — ABNORMAL LOW (ref 9–15)
BLOOD UREA NITROGEN: 11 mg/dL (ref 7–21)
BLOOD UREA NITROGEN: 12 mg/dL (ref 7–21)
BUN / CREAT RATIO: 19
BUN / CREAT RATIO: 22
CALCIUM: 8.3 mg/dL — ABNORMAL LOW (ref 8.5–10.2)
CALCIUM: 8.2 mg/dL — ABNORMAL LOW (ref 8.5–10.2)
CHLORIDE: 114 mmol/L — ABNORMAL HIGH (ref 98–107)
CHLORIDE: 109 mmol/L — ABNORMAL HIGH (ref 98–107)
CO2: 23 mmol/L (ref 22.0–30.0)
CO2: 25 mmol/L (ref 22.0–30.0)
CREATININE: 0.57 mg/dL — ABNORMAL LOW (ref 0.60–1.00)
CREATININE: 0.55 mg/dL — ABNORMAL LOW (ref 0.60–1.00)
EGFR MDRD AF AMER: >=60 mL/min/{1.73_m2} (ref >=60)
EGFR MDRD AF AMER: >=60 mL/min/{1.73_m2} (ref >=60)
EGFR MDRD NON AF AMER: >=60 mL/min/{1.73_m2} (ref >=60)
GLUCOSE RANDOM: 72 mg/dL (ref 65–179)
POTASSIUM: 3.5 mmol/L (ref 3.5–5.0)
POTASSIUM: 3.7 mmol/L (ref 3.5–5.0)
SODIUM: 140 mmol/L (ref 135–145)

## 2017-12-13 LAB — CREATININE: Creatinine:MCnc:Pt:Ser/Plas:Qn:: 0.55 — ABNORMAL LOW

## 2017-12-13 LAB — CBC W/ AUTO DIFF
BASOPHILS ABSOLUTE COUNT: 0 10*9/L (ref 0.0–0.1)
BASOPHILS RELATIVE PERCENT: 0.5 %
EOSINOPHILS ABSOLUTE COUNT: 0.1 10*9/L (ref 0.0–0.4)
EOSINOPHILS RELATIVE PERCENT: 2.2 %
HEMATOCRIT: 32.6 % — ABNORMAL LOW (ref 36.0–46.0)
HEMOGLOBIN: 10.4 g/dL — ABNORMAL LOW (ref 12.0–16.0)
LARGE UNSTAINED CELLS: 4 % (ref 0–4)
LYMPHOCYTES ABSOLUTE COUNT: 1.6 10*9/L (ref 1.5–5.0)
MEAN CORPUSCULAR HEMOGLOBIN CONC: 31.7 g/dL (ref 31.0–37.0)
MEAN CORPUSCULAR HEMOGLOBIN: 27.9 pg (ref 26.0–34.0)
MEAN CORPUSCULAR VOLUME: 88 fL (ref 80.0–100.0)
MEAN PLATELET VOLUME: 7.5 fL (ref 7.0–10.0)
MONOCYTES ABSOLUTE COUNT: 0.5 10*9/L (ref 0.2–0.8)
MONOCYTES RELATIVE PERCENT: 7.5 %
NEUTROPHILS ABSOLUTE COUNT: 3.8 10*9/L (ref 2.0–7.5)
NEUTROPHILS RELATIVE PERCENT: 60.3 %
RED BLOOD CELL COUNT: 3.71 10*12/L — ABNORMAL LOW (ref 4.00–5.20)
RED CELL DISTRIBUTION WIDTH: 14.2 % (ref 12.0–15.0)
WBC ADJUSTED: 6.2 10*9/L (ref 4.5–11.0)

## 2017-12-13 LAB — COMPREHENSIVE METABOLIC PANEL
ALBUMIN: 2.2 g/dL — ABNORMAL LOW (ref 3.5–5.0)
ALKALINE PHOSPHATASE: 82 U/L (ref 38–126)
ALT (SGPT): 42 U/L (ref 15–48)
ANION GAP: 5 mmol/L — ABNORMAL LOW (ref 9–15)
AST (SGOT): 49 U/L — ABNORMAL HIGH (ref 14–38)
BLOOD UREA NITROGEN: 11 mg/dL (ref 7–21)
BUN / CREAT RATIO: 19
CALCIUM: 8.3 mg/dL — ABNORMAL LOW (ref 8.5–10.2)
CHLORIDE: 114 mmol/L — ABNORMAL HIGH (ref 98–107)
CO2: 23 mmol/L (ref 22.0–30.0)
CREATININE: 0.57 mg/dL — ABNORMAL LOW (ref 0.60–1.00)
EGFR MDRD NON AF AMER: >=60 mL/min/{1.73_m2} (ref >=60)
GLUCOSE RANDOM: 72 mg/dL (ref 65–179)
POTASSIUM: 3.5 mmol/L (ref 3.5–5.0)
PROTEIN TOTAL: 4.9 g/dL — ABNORMAL LOW (ref 6.5–8.3)
SODIUM: 142 mmol/L (ref 135–145)

## 2017-12-13 LAB — GLUCOSE FLUID: Lab: 78

## 2017-12-13 LAB — MAGNESIUM: Magnesium:MCnc:Pt:Ser/Plas:Qn:: 1.7

## 2017-12-13 LAB — GLUCOSE, BODY FLUID

## 2017-12-13 LAB — MEAN PLATELET VOLUME: Lab: 7.5

## 2017-12-13 LAB — CO2: Carbon dioxide:SCnc:Pt:Ser/Plas:Qn:: 23

## 2017-12-13 LAB — BLOOD UREA NITROGEN: Urea nitrogen:MCnc:Pt:Ser/Plas:Qn:: 11

## 2017-12-13 LAB — PROTIME: Lab: 17.1 — ABNORMAL HIGH

## 2017-12-13 NOTE — Unmapped (Signed)
Indications: symptomatic pleural effusion    Monitoring:  The patient was monitored continuously by heart rate, blood pressure, pulse oximetry, and EKG tracing.     Procedure Details:      After the risks benefits and alternatives of the procedure were thoroughly explained, informed consent was obtained including the risks of chest pain, cough, bleeding, infection, injury to the lung or orther organ and a pneumothorax requiring chest tube placement. Immediately prior to the procedure, the time out was executed including correct patient identification and agreement on the procedure to be performed.     Using ultrasound guidance a large,hypoechoic, simple pleural effusion was noted in the RIGHT pleural space. The area was prepped with chlorhexadine and draped in sterile fashion.  Following this 17 mL of 1% lidocaine as injected subcutaneously to provide topical anesthesia then deep to the pleura.  A small incision was then made parallel and superior to the rib, the thoracentesis needle with catheter was inserted into the chest wall and advanced under constant aspiration.  Upon aspiration of pleural fluid, the catheter was advanced into the pleural space and the needle was removed.  The catheter was then connected to a drainange bag and the fluid was removed under manual drainage.  The procedure was completed with symptoms of chest pain/pressure The catheter was then removed during slow forced exhalation and bandaged.     Findings:   1100 mL of serous pleural fluid was removed. The procedure was terminated due to symptoms of chest pain/pressure. The fluid was sent for cytology, pH, glucose, protein, LDH, Cell count and differential, gram stain and culture, AFB, ADA, albumin, cholesterol, flow cytometry, ANA or pro-BNP.     Estimate Blood Loss:  None    Complications: none          Condition: stable    Plan: A follow up chest x-ray was not ordered as the procedure was uneventful.     Viviann Spare, MD, PhD  December 13, 2017  Pulmonary & Critical Care Fellow  [facility]   Pager: [phone]

## 2017-12-13 NOTE — Unmapped (Signed)
Problem: Non-Violent Restraints  Goal: Patient will remain free of restraint events  Outcome: Progressing  Patient is awake and alert to self only. Patient is disoriented to time, place, and situation. Patient has poor attention, safety awareness, and has been trying to get out of bed and pull out her IV and NGT during the day. Patient in bilateral soft wrist restraints and has a lap belt. MD order is in chart. Patient has had multiple liquid bowel movements and has a FMS in place at this time. Family is at the bedside at this time. Patient has been free of falls or injury during this admission. Will continue plan of care.  Goal: Patient will remain free of physical injury  Outcome: Progressing      Problem: Patient Care Overview  Goal: Plan of Care Review  Outcome: Progressing    Goal: Individualization and Mutuality  Outcome: Progressing    Goal: Discharge Needs Assessment  Outcome: Progressing    Goal: Interprofessional Rounds/Family Conf  Outcome: Progressing      Problem: Skin Injury Risk (Adult)  Goal: Identify Related Risk Factors and Signs and Symptoms  Related risk factors and signs and symptoms are identified upon initiation of Human Response Clinical Practice Guideline (CPG).   Outcome: Progressing    Goal: Skin Health and Integrity  Patient will demonstrate the desired outcomes by discharge/transition of care.   Outcome: Progressing      Problem: Fall Risk (Adult)  Goal: Identify Related Risk Factors and Signs and Symptoms  Related risk factors and signs and symptoms are identified upon initiation of Human Response Clinical Practice Guideline (CPG).   Outcome: Progressing    Goal: Absence of Fall  Patient will demonstrate the desired outcomes by discharge/transition of care.   Outcome: Progressing      Problem: Liver Failure, Acute/Chronic (Adult)  Goal: Signs and Symptoms of Listed Potential Problems Will be Absent, Minimized or Managed (Liver Failure, Acute/Chronic)  Signs and symptoms of listed potential problems will be absent, minimized or managed by discharge/transition of care (reference Liver Failure, Acute/Chronic (Adult) CPG).   Outcome: Progressing      Problem: Confusion, Acute (Adult)  Goal: Identify Related Risk Factors and Signs and Symptoms  Related risk factors and signs and symptoms are identified upon initiation of Human Response Clinical Practice Guideline (CPG).   Outcome: Progressing    Goal: Cognitive/Functional Impairments Minimized  Patient will demonstrate the desired outcomes by discharge/transition of care.   Outcome: Progressing    Goal: Safety  Patient will demonstrate the desired outcomes by discharge/transition of care.   Outcome: Progressing      Problem: VTE, DVT and PE (Adult)  Goal: Signs and Symptoms of Listed Potential Problems Will be Absent, Minimized or Managed (VTE, DVT and PE)  Signs and symptoms of listed potential problems will be absent, minimized or managed by discharge/transition of care (reference VTE, DVT and PE (Adult) CPG).   Outcome: Progressing

## 2017-12-13 NOTE — Unmapped (Signed)
Problem: Patient Care Overview  Goal: Plan of Care Review  Outcome: Progressing  Pt initially drowsy this shift, now A&Ox4, VSS, afebrile, O2 sats >90% on RA.  Pt OOB to [facility] with assist x1 and bathing self. Foley & restraints d/c this shift.  Pt started on 3g sodium diet, tolerating well.  2 bowel movements thus far this shift.  Pt on lovenox for VTE prophylaxis, all safety precautions in place, will continue to monitor.

## 2017-12-13 NOTE — Unmapped (Signed)
PRE-PROCEDURE HISTORY AND PHYSICAL EXAM    Darlene Thompson presents for her scheduled THORACENTESIS W/ IMAGING.    The indication for the procedure(s) is pleural effusion.      Past Medical History:   Diagnosis Date   ??? Anemia    ??? Cirrhosis (CMS-HCC)    ??? Heart murmur      Past Surgical History:   Procedure Laterality Date   ??? CESAREAN SECTION  1989   ??? CHOLECYSTECTOMY  1993   ??? GASTRIC BYPASS  2009   ??? IR TIPS  10/10/2017    IR TIPS 10/10/2017 Rush Barer, MD IMG VIR H&V [facility]   ??? PR DEBRIDEMENT, SKIN, SUB-Q TISSUE,MUSCLE,BONE,=<20 SQ CM Right 10/24/2016    Procedure: RLE WD DEBRIDEMENT;  Surgeon: Boykin Reaper, MD;  Location: MAIN OR [facility];  Service: Vascular       Allergies  Allergies   Allergen Reactions   ??? Sulfa (Sulfonamide Antibiotics) Hives     Historic from childhood   ??? Cymbalta [Duloxetine] Itching       Medications  acetaminophen, b complex vitamins, ciprofloxacin HCl, cyanocobalamin, diphenhydramine HCl, ferrous sulfate, folic acid, furosemide, gabapentin, gentamicin, lactulose, mineral oil, ondansetron, potassium, rifAXIMin, and spironolactone    Physical Examination  Vitals:    12/13/17 0730   BP: 119/61   Pulse: 87   Resp: 17   Temp: 36.6 ??C (97.9 ??F)   SpO2: 97%     Body mass index is 30.45 kg/m??.  Mental Status: disoriented     ASSESSMENT AND PLAN  Darlene Thompson has been evaluated and deemed appropriate to undergo the planned THORACENTESIS W/ IMAGING.    I explained the possible benefits and risks of the @ORPROCCALL @, including:  1. Slowing of breathing and abnormal heart rhythms.  2. Pneumothorax ??? 4 per 1,000 procedures  3. Bleeding ??? 2% of procedures  4. Infection  5. Aspiration of stomach contents  6. Bronchospasm / Asthma-like conditions  7. Other complications ??? adverse drug reactions, inflammation at IV site, sore throat, or dental injury  8. Death ??? 7 per 100,000 procedures, typically in patients who are seriously ill prior to procedure

## 2017-12-13 NOTE — Unmapped (Signed)
Daily Progress Note    Assessment/Plan:    Principal Problem:    Hepatic encephalopathy (CMS-HCC)  Active Problems:    Hydrothorax    Alcoholic cirrhosis of liver with ascites (CMS-HCC)    S/P TIPS (transjugular intrahepatic portosystemic shunt)  Resolved Problems:    * No resolved hospital problems. *        Pressure Ulcer(s)    Active Pressure Ulcer     None                 Darlene Thompson is a 52 y.o. female with PMHx decompensated cirrhosis and recurrent hepatic hydrothorax s/p TIPS 10/10/2016 that presented to [facility] with AMS most likely 2/2 Hepatic Encephalopathy.    AMS 2/2 Hepatic Encephalopathy:  Patient presented with AMS and impaired neuromuscular dysfunction on 3/18 with Ammonia of 232 and no other metabolic derangements, CT head normal. TIPS procedure noted 10/10/2017. HE likely due to medical noncompliance + TIPS. Low suspicion for infectious etiology (nml wct, afebrile), lactate is downtrending 3.1 from 3.5 on 3/18. UA unremarkable, Bcx no growth 24 hrs. Lactulose + Rifaximin regimen has improved mental status progressively. Consulted Hepatology, appreciated recs. Patient mental status has improved but continues to fluctuate.  -Ethyl Gluconate for signs of EtOH use  -D/C'd Fentanyl given mental status changes  -S/p lactulose 20g q2 and enema, cont Lactulose 20g Q4 PO  -Rifaximin 550 mg po BID  -Replete K+ and increase potassium in diet now that she is becoming more alert  -No TIPS revision required as per Hepatology recs    Recurrent hepatic hydrothorax:  Patient has had multiple thoracentesis to drain recurrent hydrothorax. Mature TIPS should have begun to reduce fluid overload, GI indicates normally clinical picture would call for adjusting gradient across shunt, however patients with Hepatic Encephalopathy are contraindicated. Patient is afebrile, no leukocytosis on presentation today. Moderate to large right pleural effusion compatible with hepatic hydrothorax on CXR. S/p thoracentesis today, 3/20. 1.1L of serous fluid removed, procedure terminated due to symptoms of chest pain/pressure.  -F/u pleural fluid labs and cytology studies  -Holding home diuretic regimen with spironolactone 200mg  and lasix 40mg  given patient may be volume down    Alcoholic Cirrhosis:   Denies EtOH use since My 2017. Liver synthetic function has slowly improved since then. Complicated by ascites (previously required paracentesis and hydrothorax. No known varices. Vaccinated for hep A but not B. MELD 13 in 12/11/17 at 19:15. Performed paracentesis as noted above.  - Consulted Hepatology, appreciate recs   - Holding diuretic regimen Spironolactone 200mg  and lasix 40mg  for fluid overload  - Restarted Ciprofloxacin for SBP prophylasix given QTc was down to mid 400's   - Obtain MRI abdomen with and without contrast when the patient can follow instructions in order to follow-up lesion in segment 4 noted on study from 06/26/2017    Macrocytic Anemia: Cont home B12 and iron    Chronic Pain: Holding home gabapentin and tramadol given AMS, tylenol PRN    GERD: Protonox daily    Global  - FEN/GI: NPO  - PPx: Protonox  - DVT ppx: lovenox  - Code Status: FULL CODE  - Dispo: [facility]  ___________________________________________________________________    Subjective:  Patient stable while awake overnight, desats to mid-80s while sleeping without 2L [place]. At 12am became alert and oriented, able to swallow medications. 3am became agitated, pulled out NG tube. Given fentanyl PRN for back pain. 1 Bowel Movement reported.    Labs/Studies:  Labs and Studies from the last  24hrs per EMR and Reviewed    Objective:  BP 109/54  - Pulse 84  - Temp 36.6 ??C (97.9 ??F) (Oral)  - Resp 20  - Wt 83 kg (182 lb 15.7 oz)  - LMP 04/23/2016 (Approximate)  - SpO2 99%  - BMI 30.45 kg/m??     Physical Exam:  General: Fluctuating alertness and ability to interact  CV: Tachycardic, normal S1S2, regular rhythm, no m/r/g  PULM: No increased work of breathing. No cyanosis or clubbing. Mild crackles on right lower lung base  ABD: NT/ND, normoactive bowel sounds, hepatomegaly noted  NEURO: Oriented x2  SKIN: Dusky lesions noted on RLE near shin. 1+ LE edema bilaterally up to knees     Matthew Saras, MS3    I attest that I have reviewed the student note and that the components of the history of the present illness, the physical exam, and the assessment and plan documented were performed by me or were performed in my presence by the student where I verified the documentation and performed (or re-performed) the exam and medical decision making.   Tawnya Crook, MD

## 2017-12-13 NOTE — Unmapped (Signed)
Problem: Patient Care Overview  Goal: Plan of Care Review  Outcome: Progressing  Pt with VSS on RA while awake, 2L [place] while asleep. Desatts to the mid-80s while sleeping w/o 2L [place]. Pt fairly obtunded at the beginning of the shift. Around midnight, became A&Ox3, able to take medications and safely swallow. Became more obtunded again around 0300, unable to wake up to take medications. Got an order for an NGT, as pt had pulled out prior NGT around midnight. As RN was about to replace NGT, pt able to wake up and take medication again. Pt with BMx1 so far this shift, FMS fell out around midnight. UO adequate, Foley in place. Given fentanyl PRN for pain, mostly in back 2/2 being in bed. Bilateral wrist restraints and lap belt in place 2/2 mental status fluctuating, taking out tubes and pulling off wires when more out of it. Falls precautions maintained. Contact precautions maintained. WCTM.
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
## 2017-12-14 DIAGNOSIS — J90 Pleural effusion, not elsewhere classified: Principal | ICD-10-CM

## 2017-12-14 LAB — NEUTROPHILS RELATIVE PERCENT: Lab: 48.5

## 2017-12-14 LAB — COMPREHENSIVE METABOLIC PANEL
ALBUMIN: 2.3 g/dL — ABNORMAL LOW (ref 3.5–5.0)
ALKALINE PHOSPHATASE: 106 U/L (ref 38–126)
ANION GAP: 7 mmol/L — ABNORMAL LOW (ref 9–15)
AST (SGOT): 45 U/L — ABNORMAL HIGH (ref 14–38)
BLOOD UREA NITROGEN: 10 mg/dL (ref 7–21)
BUN / CREAT RATIO: 21
CALCIUM: 8.1 mg/dL — ABNORMAL LOW (ref 8.5–10.2)
CHLORIDE: 109 mmol/L — ABNORMAL HIGH (ref 98–107)
CO2: 22 mmol/L (ref 22.0–30.0)
CREATININE: 0.48 mg/dL — ABNORMAL LOW (ref 0.60–1.00)
EGFR MDRD AF AMER: >=60 mL/min/{1.73_m2} (ref >=60)
EGFR MDRD NON AF AMER: >=60 mL/min/{1.73_m2} (ref >=60)
GLUCOSE RANDOM: 100 mg/dL (ref 65–179)
POTASSIUM: 3.7 mmol/L (ref 3.5–5.0)
PROTEIN TOTAL: 5.1 g/dL — ABNORMAL LOW (ref 6.5–8.3)
SODIUM: 138 mmol/L (ref 135–145)

## 2017-12-14 LAB — CBC W/ AUTO DIFF
BASOPHILS ABSOLUTE COUNT: 0 10*9/L (ref 0.0–0.1)
EOSINOPHILS ABSOLUTE COUNT: 0.2 10*9/L (ref 0.0–0.4)
EOSINOPHILS RELATIVE PERCENT: 4.1 %
HEMATOCRIT: 33 % — ABNORMAL LOW (ref 36.0–46.0)
HEMOGLOBIN: 10.9 g/dL — ABNORMAL LOW (ref 12.0–16.0)
LARGE UNSTAINED CELLS: 5 % — ABNORMAL HIGH (ref 0–4)
LYMPHOCYTES ABSOLUTE COUNT: 1.4 10*9/L — ABNORMAL LOW (ref 1.5–5.0)
LYMPHOCYTES RELATIVE PERCENT: 33.2 %
MEAN CORPUSCULAR HEMOGLOBIN CONC: 33 g/dL (ref 31.0–37.0)
MEAN CORPUSCULAR HEMOGLOBIN: 29.7 pg (ref 26.0–34.0)
MEAN CORPUSCULAR VOLUME: 90 fL (ref 80.0–100.0)
MEAN PLATELET VOLUME: 8.1 fL (ref 7.0–10.0)
NEUTROPHILS ABSOLUTE COUNT: 2.1 10*9/L (ref 2.0–7.5)
NEUTROPHILS RELATIVE PERCENT: 48.5 %
PLATELET COUNT: 170 10*9/L (ref 150–440)
RED BLOOD CELL COUNT: 3.67 10*12/L — ABNORMAL LOW (ref 4.00–5.20)
RED CELL DISTRIBUTION WIDTH: 14.2 % (ref 12.0–15.0)
WBC ADJUSTED: 4.3 10*9/L — ABNORMAL LOW (ref 4.5–11.0)

## 2017-12-14 LAB — BILIRUBIN TOTAL: Bilirubin:MCnc:Pt:Ser/Plas:Qn:: 0.8

## 2017-12-14 LAB — MAGNESIUM: Magnesium:MCnc:Pt:Ser/Plas:Qn:: 1.8

## 2017-12-14 LAB — PROTIME-INR: INR: 1.38

## 2017-12-14 LAB — INR: Lab: 1.38

## 2017-12-14 NOTE — Unmapped (Signed)
See procedure note.

## 2017-12-14 NOTE — Unmapped (Signed)
MRI Screening Form needs to be filled out in EPIC in order to proceed with examination. Please ensure to list all surgeries and implants.
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
## 2017-12-14 NOTE — Unmapped (Signed)
Problem: Patient Care Overview  Goal: Plan of Care Review  Outcome: Progressing  Pt A&Ox3, slightly disoriented to time but otherwise intact and appropriate. VSS on RA, 2L [place] while asleep. C/o some pain with coughing to thoracentesis site, treated with PRNs as able. Pt refused 0200 lactulose 2/2 wanting to sleep, has had many BMs today. Falls precautions maintained. Adequate UO.Contact precautions maintained. WCTM.     Problem: Skin Injury Risk (Adult)  Goal: Identify Related Risk Factors and Signs and Symptoms  Related risk factors and signs and symptoms are identified upon initiation of Human Response Clinical Practice Guideline (CPG).   Outcome: Progressing   12/14/17 0326   Skin Injury Risk (Adult)   Related Risk Factors (Skin Injury Risk) critical care admission;edema     Goal: Skin Health and Integrity  Patient will demonstrate the desired outcomes by discharge/transition of care.   Outcome: Progressing   12/14/17 0326   Skin Injury Risk (Adult)   Skin Health and Integrity making progress toward outcome       Problem: Fall Risk (Adult)  Goal: Identify Related Risk Factors and Signs and Symptoms  Related risk factors and signs and symptoms are identified upon initiation of Human Response Clinical Practice Guideline (CPG).   Outcome: Progressing   12/14/17 0326   Fall Risk (Adult)   Related Risk Factors (Fall Risk) environment unfamiliar;sleep pattern alteration;slippery/uneven surfaces   Signs and Symptoms (Fall Risk) presence of risk factors     Goal: Absence of Fall  Patient will demonstrate the desired outcomes by discharge/transition of care.   Outcome: Progressing   12/14/17 0326   Fall Risk (Adult)   Absence of Fall making progress toward outcome       Problem: Liver Failure, Acute/Chronic (Adult)  Goal: Signs and Symptoms of Listed Potential Problems Will be Absent, Minimized or Managed (Liver Failure, Acute/Chronic)  Signs and symptoms of listed potential problems will be absent, minimized or managed by discharge/transition of care (reference Liver Failure, Acute/Chronic (Adult) CPG).   Outcome: Progressing   12/14/17 0326   Liver Failure, Acute/Chronic (Adult)   Problems Assessed (Liver Failure, Acute/Chronic) all   Problems Present (Liver Failure) fluid/electrolyte imbalance       Problem: Confusion, Acute (Adult)  Goal: Identify Related Risk Factors and Signs and Symptoms  Related risk factors and signs and symptoms are identified upon initiation of Human Response Clinical Practice Guideline (CPG).   Outcome: Progressing   12/12/17 0724 12/14/17 0326   Confusion, Acute (Adult)   Related Risk Factors (Acute Confusion) metabolic abnormalities --    Signs and Symptoms (Acute Confusion) --  emotional/behavioral disturbances     Goal: Cognitive/Functional Impairments Minimized  Patient will demonstrate the desired outcomes by discharge/transition of care.   Outcome: Progressing   12/14/17 0326   Confusion, Acute (Adult)   Cognitive/Functional Impairments Minimized making progress toward outcome     Goal: Safety  Patient will demonstrate the desired outcomes by discharge/transition of care.   Outcome: Progressing   12/14/17 0326   Confusion, Acute (Adult)   Safety making progress toward outcome       Problem: VTE, DVT and PE (Adult)  Goal: Signs and Symptoms of Listed Potential Problems Will be Absent, Minimized or Managed (VTE, DVT and PE)  Signs and symptoms of listed potential problems will be absent, minimized or managed by discharge/transition of care (reference VTE, DVT and PE (Adult) CPG).   Outcome: Progressing   12/14/17 0326   VTE, DVT and PE (Adult)   Problems  Assessed (VTE, DVT, PE) all   Problems Present (VTE, DVT, PE) none

## 2017-12-14 NOTE — Unmapped (Signed)
Physician Discharge Summary  Identifying Information:   Darlene Thompson  Jan 20, 1966  [id number]    Admit date: 12/11/2017    Discharge date: 12/14/2017     Discharge Service: Med [facility] ([facility])    Discharge Attending Physician: Janell Quiet Mock, MD    Discharge to: Home    Discharge Diagnoses:  Principal Problem:    Hepatic encephalopathy (CMS-HCC)  Active Problems:    Hydrothorax    Alcoholic cirrhosis of liver with ascites (CMS-HCC)    S/P TIPS (transjugular intrahepatic portosystemic shunt)  Resolved Problems:    * No resolved hospital problems. *    Post Discharge Follow Up Issues:   1. BP and BMP check within a week since resuming diuretic regimen with home spironolactone and lasix upon discharge  2. MRI abdomen with and without contrast in order to follow-up lesion in segment 4 noted on study from 06/26/2017  3. Consider Hep B vaccination  4. Continued thoracentesis for symptomatic management and/or revision of TIPS per hepatology    Hospital Course:   Darlene Thompson is a 52 y.o. female with PMHx of decompensated cirrhosis and recurrent hepatic hydrothorax s/p TIPS 10/10/2016 who presented to [facility] with AMS secondary to hepatic encephalopathy.  ??  Hepatic encephalopathy: Patient presented obtunded with impaired neuromuscular dysfunction on 12/11/17 and ammonia level of 232 and no other metabolic derangements. CT head was normal. TIPS procedure was performed on 10/10/2017. Later, patient stated that she had missed her lactulose. Hepatic encephalopathy likely due to medical noncompliance in the setting of recent TIPS. Patient was febrile but low suspicion for infectious etiology, given no leukocytosis and lactate trended down. UA was unremarkable, Bcx no growth past 48 hrs. Patient was started on lactulose 20g q2 and home Rifaximin 550 mg BID. Hepatology was consulted, who recommended against TIPS revision and suggested continued medical management. She also had a lactulose enema and was switched from lactulose 20g q2 to q4. Borderline low K+ was repleted to help facilitate renal excretion of ammonia. Patient was intermittently agitated but her mental status improved with lactulose and multiple bowel movements over the next 2 days and ammonia level on repeat check was 25. Patient was discharged on home lactulose 20g TID and Rifaximin 550 mg BID.  ??  Recurrent hepatic hydrothorax: Patient has had multiple thoracentesis to drain recurrent hydrothorax, s/p TIPS on 1/15. CXR showed moderate to large right pleural effusion increased from prior compatible with hepatic hydrothorax. However, patient was febrile and hence thoracentesis was performed for diagnostic and therapeutic purposes in case patient had any underlying infection. 1100 mL of serous pleural fluid was removed on 3/20. The procedure was terminated due to symptoms of chest pain/pressure. Pleural fluid analysis suggested transudative (pseudo-exudative) in nature: fluid-to-serum protein >0.5 and fluid LDH >2/3 of normal LDH but was falsely positive for exudative given that patient had low protein concentration (<2.5 g/dL) in pleural fluid and a serum-to-pleural fluid albumin gradient >1.1 g/d similar to ascitic fluid, suggesting hepatic hydrothorax.   ??  Alcoholic cirrhosis: Patient denied EtOH use since May 2017 with liver synthetic function has slowly improved since then. Cirrhosis has been complicated by ascites requiring paracentesis and hydrothorax. No known varices. Patient is vaccinated for hep A but not B. MELD 13 in 12/11/17 at 19:15. Medicine procedure service was consulted for paracentesis. Patient did not have a sizeable pocket for paracentesis. Patient was discharged on home spironolactone 200mg  and lasix 40mg ??for fluid overload, ciprofloxacin for SBP prophylaxis and lactulose 20g TID and Rifaximin  550 mg BID. Patient needs MRI abdomen with and without contrast in order to follow-up lesion in segment 4 noted on study from 06/26/2017. Macrocytic anemia: Continued home B12 and iron  ??  Chronic pain:??Tylenol prn    Procedures: Thoracentesis  _____________________________________________________________________________  Discharge Day Services:  BP 103/48  - Pulse 78  - Temp 36.3 ??C (Oral)  - Resp 16  - Wt 88.3 kg (194 lb 10.7 oz)  - LMP 04/23/2016 (Approximate)  - SpO2 100%  - BMI 32.39 kg/m??   Pt seen on the day of discharge and determined appropriate for discharge.    Condition at Discharge: stable    Length of Discharge: I spent greater than 30 mins in the discharge of this patient.  _____________________________________________________________________________  Discharge Medications:     Your Medication List      STOP taking these medications    gabapentin 100 MG capsule  Commonly known as:  NEURONTIN     ondansetron 4 MG disintegrating tablet  Commonly known as:  ZOFRAN-ODT        CONTINUE taking these medications    ciprofloxacin HCl 500 MG tablet  Commonly known as:  CIPRO  Take 1 tablet (500 mg total) by mouth daily.     folic acid 1 MG tablet  Commonly known as:  FOLVITE  Take 1 tablet (1 mg total) by mouth daily.     furosemide 40 MG tablet  Commonly known as:  LASIX  Take 1 tablet (40 mg total) by mouth daily.     lactulose 10 gram/15 mL solution  Commonly known as:  CHRONULAC  Take 30 mL (20 g total) by mouth Three (3) times a day.     rifAXIMin 550 mg Tab  Commonly known as:  Xifaxan  Take 1 tablet (550 mg total) by mouth Two (2) times a day.     spironolactone 100 MG tablet  Commonly known as:  ALDACTONE  Take 2 tablets (200 mg total) by mouth daily.          _____________________________________________________________________________  Pending Test Results (if blank, then none):   Order Current Status    Albumin Level, Body Fluid Collected (12/13/17 1110)    Body fluid cell count Collected (12/13/17 1110)    Protein, Body Fluid Collected (12/13/17 1110)    Cytology - Fluid In process    Ethyl Glucuronide Screen, Urine In process Blood Culture #1 Preliminary result    Blood Culture #2 Preliminary result    Pleural Fluid Culture Preliminary result        Most Recent Labs:  Microbiology Results (last day)     Procedure Component Value Date/Time Date/Time    EPI Surveillance Screen [[id number]] Collected:  12/12/17 1618    Lab Status:  Final result Specimen:  Swab from Rectal Updated:  12/14/17 1002     Epi Surveillance Screen Carbapenem Resistant Enterobacteriaceae NOT DETECTED    Body fluid cell count [phone]] Collected:  12/13/17 1110    Lab Status:  Final result Specimen:  Fluid, Pleural Updated:  12/13/17 1812     Fluid Type Fluid, Pleural     Color, Fluid Yellow     Appearance, Fluid Clear     Nucleated Cells, Fluid 113 ul      RBC, Fluid 644 ul      Neutrophil %, Fluid 1.0 %      Lymphocytes %, Fluid 49.0 %      Mono/Macro % , Fluid 50.0 %      #Cells  Counted BF Diff 100     Fluid Comments Macrophages present.  Macrophages with inclusions present.  Erythrophagocytosis present.     Blood Culture #1 [[id number]]  (Normal) Collected:  12/11/17 1552    Lab Status:  Preliminary result Specimen:  Blood from Line venous Updated:  12/13/17 1615     Blood Culture, Routine No Growth at 48 hours    Blood Culture #2 [[id number]]  (Normal) Collected:  12/11/17 1552    Lab Status:  Preliminary result Specimen:  Blood from Line venous Updated:  12/13/17 1615     Blood Culture, Routine No Growth at 48 hours    Cholesterol, Body Fluid [[id number]] Collected:  12/13/17 1110    Lab Status:  Final result Specimen:  Fluid, Pleural Updated:  12/13/17 1222     Cholesterol, Fluid <50 mg/dL      Chol, Fluid Type Fluid, Pleural    Narrative:         The reference range for this body fluid has not been established. The test result must be integrated into the clinical context for interpretation.  This test was developed and its performance characteristics determined by the [facility] of the [employer], [employer]. This test has not been cleared or approved by the FDA. The laboratory is regulated under CAP and CLIA as qualified to perform high-complexity testing. This test is to be used for clinical purposes and should not be regarded as investigational or for research.    Protein, Body Fluid [[id number]] Collected:  12/13/17 1110    Lab Status:  Final result Specimen:  Fluid, Pleural Updated:  12/13/17 1222     Protein, Fluid 2.8 g/dL      Protein, Fluid Type Fluid, Pleural    Narrative:         The reference range for this body fluid has not been established. The test result must be integrated into the clinical context for interpretation.  This test was developed and its performance characteristics determined by the [facility] the [employer] [employer]. This test has not been cleared or approved by the FDA. The laboratory is regulated under CAP and CLIA as qualified to perform high-complexity testing. This test is to be used for clinical purposes and should not be regarded as investigational or for research.    Lactate Dehydrogenase, Body Fluid [[id number]] Collected:  12/13/17 1110    Lab Status:  Final result Specimen:  Fluid, Pleural Updated:  12/13/17 1222     LDH, Fluid 346 U/L      LD, Fluid Type Fluid, Pleural    Narrative:         The reference range for this body fluid has not been established. The test result must be integrated into the clinical context for interpretation.  This test was developed and its performance characteristics determined by the [facility] the [employer] [employer]. This test has not been cleared or approved by the FDA. The laboratory is regulated under CAP and CLIA as qualified to perform high-complexity testing. This test is to be used for clinical purposes and should not be regarded as investigational or for research.    Albumin Level, Body Fluid [[id number]] Collected:  12/13/17 1110    Lab Status:  Final result Specimen:  Fluid, Pleural Updated:  12/13/17 1222     Albumin, Fluid 1.3 g/dL      Albumin, Fluid Type Fluid, Pleural    Narrative:  The reference range for this body fluid has not been established. The test result must be integrated into the clinical context for interpretation.  This test was developed and its performance characteristics determined by the [facility] the [employer] [employer]. This test has not been cleared or approved by the FDA. The laboratory is regulated under CAP and CLIA as qualified to perform high-complexity testing. This test is to be used for clinical purposes and should not be regarded as investigational or for research.    Amylase, body fluid [[id number]] Collected:  12/13/17 1110    Lab Status:  Final result Specimen:  Fluid, Pleural Updated:  12/13/17 1222     Amylase, Fluid <30 U/L      Amylase, Fluid Type Fluid, Pleural    Narrative:         The reference range for this body fluid has not been established. The test result must be integrated into the clinical context for interpretation.  This test was developed and its performance characteristics determined by the [facility] the [employer] [employer]. This test has not been cleared or approved by the FDA. The laboratory is regulated under CAP and CLIA as qualified to perform high-complexity testing. This test is to be used for clinical purposes and should not be regarded as investigational or for research.    Pleural Fluid Culture [[id number]] Collected:  12/13/17 1111    Lab Status:  Preliminary result Specimen:  Fluid, Pleural from Pleural, right Updated:  12/13/17 1206     Gram Stain Result 1+ Polymorphonuclear leukocytes      No organisms seen    Narrative:       Specimen Source: Pleural, right    PH, Fluid [[id number]] Collected:  12/13/17 1110    Lab Status:  Final result Specimen:  Fluid, Pleural Updated:  12/13/17 1132     pH, Fluid 7.52     pH, Fluid Type Fluid, Pleural    Narrative:         The reference range for this body fluid has not been established. The test result must be integrated into the clinical context for interpretation.  This test was developed and its performance characteristics determined by the [facility] the [employer] [employer]. This test has not been cleared or approved by the FDA. The laboratory is regulated under CAP and CLIA as qualified to perform high-complexity testing. This test is to be used for clinical purposes and should not be regarded as investigational or for research.    Body fluid cell count [[id number]] Collected:  12/13/17 1110    Lab Status:  No result Specimen:  Fluid, Ascitic     Protein, Body Fluid [[id number]] Collected:  12/13/17 1110    Lab Status:  No result Specimen:  Fluid, Ascitic     Albumin Level, Body Fluid [[id number]] Collected:  12/13/17 1110    Lab Status:  No result Specimen:  Fluid, Ascitic from Other-enter as order comment           Lab Results   Component Value Date    WBC 4.3 (L) 12/14/2017    HGB 10.9 (L) 12/14/2017    HCT 33.0 (L) 12/14/2017    PLT 170 12/14/2017       Lab Results   Component Value Date    NA 138 12/14/2017    K 3.7 12/14/2017    CL 109 (H) 12/14/2017    CO2 22.0 12/14/2017  BUN 10 12/14/2017    CREATININE 0.48 (L) 12/14/2017    CALCIUM 8.1 (L) 12/14/2017    MG 1.7 12/13/2017    PHOS 3.1 10/11/2017       Lab Results   Component Value Date    ALKPHOS 106 12/14/2017    BILITOT 0.8 12/14/2017    BILIDIR 0.80 (H) 01/15/2017    PROT 5.1 (L) 12/14/2017    ALBUMIN 2.3 (L) 12/14/2017    ALT 36 12/14/2017    AST 45 (H) 12/14/2017    GGT 164 (H) 10/20/2017       Lab Results   Component Value Date    PT 15.7 (H) 12/14/2017    INR 1.38 12/14/2017    APTT 37.8 (H) 12/11/2017     Hospital Radiology:  Xr Chest 1 View Portable    Result Date: 12/11/2017  EXAM: XR CHEST PORTABLE DATE: 12/11/2017 3:06 PM ACCESSION: [id number] UN DICTATED: 12/11/2017 3:26 PM INTERPRETATION LOCATION: [facility] CLINICAL INDICATION: 52 years old Female with ALTERED MENTAL STATUS-  COMPARISON: 11/27/17, and earlier TECHNIQUE: Portable AP  semiupright view of the chest. CONCLUSIONS: -Decrease in lung inflation, and increase in peripheral nodular opacities in right lung, which could represent round atelectasis. Underlying process (including pneumonia) not excluded. Chest CT (preferably with contrast) could be considered if further characterization is warranted. - Background pulmonary vascular congestion /cephalization unchanged. - Moderate to large right pleural effusion increased from prior (compatible with hepatic hydrothorax). No sizable left effusion or pneumothoraces. -Cardiomediastinal silhouette unchanged.     Ct Head Wo Contrast    Result Date: 12/11/2017  EXAM: Computed tomography, head or brain without contrast material. DATE: 12/11/2017 2:56 PM ACCESSION: [id number] UN DICTATED: 12/11/2017 3:08 PM INTERPRETATION LOCATION: [facility] CLINICAL INDICATION: 52 years old Female with ALTERED MENTAL STATUS-  COMPARISON: Head CT dated 05/10/2009 TECHNIQUE: Axial CT images of the head  from skull base to vertex without contrast. FINDINGS:There is no evidence of intracranial hemorrhage or acute infarct. There is no midline shift. No fractures are evident. Minimal mucosal thickening in the right maxillary sinus. The remainder of the paranasal sinuses and mastoid air cells are clear.     No acute intracranial abnormality.    Pvl Venous Duplex Lower Extremity Right    Result Date: 12/12/2017   Peripheral Vascular Lab     [address]   [place], [place] [address]  PVL VENOUS DUPLEX LOWER EXTREMITY RIGHT Patient Demographics Pt. Name: MALAYNA NOORI Location: PVL Inpatient Bedside MRN:      [id number]           Sex:      F DOB:      09-Sep-1966          Age:      83 years  Study Information Authorizing Provider [phone] [facility]      Performed Time        12/12/2017 Name                 Kanen Mottola 10:08:25 AM Ordering Physician   Tawnya Crook      Patient Location      [facility] Accession Number     [id number] [facility]      Technologist          Coralee Rud  RVT Diagnosis:                              Geographical information systems officer Ordered Reason For Exam: DVT Indication: RLE swelling Risk Factors: Factor V Leiden. Anticoagulation: (Lovenox). Protocol The major deep veins from the inguinal ligament to the ankle are assessed for bilaterally for compressibility and spectral Doppler flow characteristics. The assessed veins include bilateral common femoral vein, femoral vein in the thigh, popliteal vein, and intramuscular calf veins. The iliac vein is assessed indirectly using Doppler waveform analysis. The great saphenous vein is assessed for compressibility at the saphenofemoral junction, and the small saphenous vein assessed for compressibility behind the knee. Limitations: Patient compativeness.  Right Duplex Findings Not visualized segments included the calf veins. All veins visualized appear fully compressible. Unable to assess Doppler waveforms due to patient combativeness.  Left Duplex Findings The only veins visualized were the FV at mid and distal thigh which appear fully compressible. Unable to assess all other veins due to patient combativeness.  Right Technical Summary Not visualized segments included the calf veins. No evidence of deep venous obstruction in the lower extremity. Unable to assess Doppler waveforms due to patient combativeness.  Left Technical Summary No evidence of obstruction in the FV at mid and distal thigh. Unable to assess all other veins due to patient combativeness.  Final Interpretation Right Portions of this examination were limited- see technologist comments above. There is no evidence of DVT in the lower extremity. Left Portions of this examination were limited- see technologist comments above. There is no evidence of DVT in the lower extremity.  Electronically signed by [id number] Jodell Cipro MD on 12/12/2017 at 12:12:34 PM.      Xr Abdomen 1 View    Result Date: 12/12/2017  EXAM: XR ABDOMEN 1 VIEW DATE: 12/12/2017 12:12 PM ACCESSION: [id number] UN DICTATED: 12/12/2017 1:22 PM INTERPRETATION LOCATION: [facility] CLINICAL INDICATION: 52 years old Female with NGT (CATHETER NON VASC FIT & ADJ)-  COMPARISON: Abdomen radiographs on 12/11/2017. TECHNIQUE: Supine view of the abdomen. FINDINGS: Partially imaged enteric tube tip overlying the lower mid abdomen, likely in the jejunum given history of Roux-en-Y gastric bypass. Mild gaseous dilatation of the small and large bowel. Stable TIPS stent in the right upper quadrant. Right pleural effusion, unchanged. The left lung base is clear.     Enteric tube with tip overlying the lower mid abdomen, likely in the jejunum.    Xr Abdomen 1 View    Result Date: 12/12/2017  EXAM: XR ABDOMEN 1 VIEW DATE: 12/11/2017 11:19 PM ACCESSION: [id number] UN DICTATED: 12/11/2017 11:55 PM INTERPRETATION LOCATION: [facility] CLINICAL INDICATION: 52 years old Female with NGT (CATHETER NON VASC FIT & ADJ)-  COMPARISON: CT abdomen pelvis 12/03/2016, abdominal radiograph same day at 1701 hours TECHNIQUE: Supine view of the abdomen. FINDINGS: Limited study for evaluation of enteric tube. Interval advancement of enteric tube with tip overlying the lower mid abdomen, likely in the jejunum given history of Roux-en-Y gastric bypass. Gas is seen in nondilated loops of large and small bowel. Unchanged TIPS stent. Partially imaged right lung base opacities and right pleural effusion.     Interval advancement of enteric tube with tip and side-port likely in the jejunum.  Xr Abdomen 1 View    Result Date: 12/11/2017  EXAM: XR ABDOMEN 1 VIEW DATE: 12/11/2017 5:06 PM ACCESSION: [id number] UN DICTATED: 12/11/2017 5:07 PM INTERPRETATION LOCATION: [facility] CLINICAL INDICATION: 52 years old Female with NGT (CATHETER NON VASC FIT & ADJ)- NG pulled back-  COMPARISON: 12/11/2017 abdominal radiograph and chest radiograph TECHNIQUE: Semiupright view of the abdomen. FINDINGS: Nasogastric tube slightly retracted with tip and sidehole terminating within the expected region of the gastric pouch in a patient status post gastric bypass surgery. Unchanged TIPS stent. Partially visualized bowel gas pattern is nonobstructed. Unchanged tiny calcification overlying the right upper quadrant. The lungs are partially visualized and appear grossly similar compared to recent prior chest radiograph on 12/11/2017. Consistent with peripheral nodular opacities in the right lung suggesting round atelectasis and/or pneumonia. Again consider chest CT for differentiation. Underlying pulmonary vascular congestion and cephalization is unchanged.     - Nasogastric tube slightly retracted with tip and sidehole terminating within the expected region of the gastric pouch.    Xr Abdomen 1 View    Result Date: 12/11/2017  EXAM: XR ABDOMEN 1 VIEW DATE: 12/11/2017 4:27 PM ACCESSION: [id number] UN DICTATED: 12/11/2017 4:30 PM INTERPRETATION LOCATION: [facility] CLINICAL INDICATION: 52 years old Female with NGT (CATHETER NON VASC FIT & ADJ)-  COMPARISON: Concurrent chest radiograph. TECHNIQUE: Supine view of the abdomen. FINDINGS: The evaluation is limited due to the presence of motion artifacts. Sequela of TIPS visualized overlying the right upper quadrant. Partially imaged enteric tube with tip terminating in the proximal small bowel which is likely secondary to gastric bypass surgery. Nonobstructing gas pattern visualized in the small and large bowel. No abnormal soft tissue masses noted. A tiny calcification overlying the right upper quadrant.. No evidence of pneumoperitoneum. Please refer to concurrent chest radiographs for findings in the chest.     Partially imaged enteric tube with tip terminating in the proximal small bowel, possibly jejunum due to history of gastric bypass surgery.    [facility] Rush Protocol Emergency    Result Date: 12/11/2017  Rapid Ultrasound for Shock and Hypotension (RUSH) Indication: A focused ultrasound exam of the peritoneal space (including one of more the following areas: sub-phrenic, Morison???s pouch, splenorenal, superior colic gutters, and retro-vesicular), pericardial space/IVC, pleural spaces, and/or aorta was performed to evaluate for internal blood loss, pericardial effusion, RV/LV systolic function, volume status, intrathoracic pathology, and/or large vessel pathology. The ultrasound was performed with the following indications, as noted in the H&P: undifferentiated Altered Mental Status with history of hydrothorax. Identified structures: PUMP: heart (parasternal long axis, parasternal short axis and IVC (long axis)) TANK (intravascular): abdominal and thoracic spaces PIPES: abdominal aorta (both transverse and longitudinal, from the diaphragmatic hiatus to the aortic bifurcation) Findings: Exam of the above structures revealed the following findings::   PUMP (heart & IVC) - CPT: 72536-64 (cardiac/IVC)  Pericardial effusion: Absent   Pericardial tamponade: N/A  Global LV function: Normal  Right ventricular size: Normal   Signs of RV strain: N/A  IVC: Normal respiratory variation  TANK (abdomen & thoracic spaces) - CPT: 40347-42 (abdomen) + 59563-87 (chest)  Evaluation for free fluid in:   Morison's pouch: Absent   Pleural space:  Present    If pleural fluid is present, which side: Right   Evaluation of pneumothorax: Absent  PIPES (abdominal aorta) - CPT: 56433-29(JJOAC)  Not well visualized Other findings: Large right pleural effusion. Limitations: None. Impression: PUMP No sonographic evidence of significant cardiac dysfunction, No sonographic evidence of significant pericardial effusion and Normal  RV TANK Pleural effusion PIPES N/A Interpreted by: Robbi Garter, MD _____________________________________________________________________________  Discharge Instructions:   Activity Instructions     Activity as tolerated             Diet Instructions     Discharge diet (specify)       Discharge Nutrition Therapy:  Sodium Restricted (2gm Na)    We recommend that you stick to a lower sodium (less than 2 grams/day of salt) diet to help you keep fluid from building up in your legs, lungs, and abdomen.               Follow Up instructions and Outpatient Referrals     Call MD for:       Confusion not releived with extra doses of lactulose and resulting bowel movements, any vomiting blood or dark/tarry stools, any severe abdominal pain.         Call MD for:  difficulty breathing, headache or visual disturbances       Call MD for:  severe uncontrolled pain       Call MD for:  temperature >38.5 Celsius       Discharge instructions       Ms. Moro, you were admitted to [facility] for confusion that was due to liver failure.  THis is now improved with aggressive treatment with lactulose and resulting bowel movemetns.  We strongly recommend that you take lactulose every day and up to several times a day in order to achieve 3-5 bowel movements a day in order to prevent confusion.    You may need to take more lacutlose if you start to get any confusion or foggy thinking and we recommend that someone checks on you every day to make sure your thinking is clear.     We recommend close follow up with your primary care provider and/or your primary hepatology team in the coming week to discuss your recent hospital stay and to have your labs checked.  Plan on making this/these appointments even if you are feeling better and be sure to take your medications and a list of questions with you so your providers can better guide your care.     Continue to stick to a lower sodium diet to prevent fluid build up.    Avoid medications like oxycodone, tramadol, gabapentin as these can make confusioin worse.  Talk to your prescribing providers regarding the risk and benefits of these medications given your liver disease and confusion risk.    We recommend that you have another MRI of your abdomen in the coming week(s).  Your GI providers will help get this ordered/scheduled.             Appointments which have been scheduled for you    Dec 15, 2017 10:20 AM EDT  (Arrive by 9:50 AM)  RETURN ALCOHOL PSYCHOLSOCIAL with Launa Flight, ANP  [facility]) [address]  [place] [address]  [phone]
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
## 2017-12-14 NOTE — Unmapped (Addendum)
Darlene Thompson is a 52 y.o. female with PMHx of decompensated cirrhosis and recurrent hepatic hydrothorax s/p TIPS 10/10/2016 who presented to [facility] with AMS secondary to hepatic encephalopathy.  ??  Hepatic encephalopathy: Patient presented obtunded with impaired neuromuscular dysfunction on 12/11/17 and ammonia level of 232 and no other metabolic derangements. CT head was normal. TIPS procedure was performed on 10/10/2017. Later, patient stated that she had missed her lactulose. Hepatic encephalopathy likely due to medical noncompliance in the setting of recent TIPS. Patient was febrile but low suspicion for infectious etiology, given no leukocytosis and lactate trended down. UA was unremarkable, Bcx no growth past 48 hrs. Patient was started on lactulose 20g q2 and home Rifaximin 550 mg BID. Hepatology was consulted, who recommended against TIPS revision and suggested continued medical management. She also had a lactulose enema and was switched from lactulose 20g q2 to q4. Borderline low K+ was repleted to help facilitate renal excretion of ammonia. Patient was intermittently agitated but her mental status improved with lactulose and multiple bowel movements over the next 2 days and ammonia level on repeat check was 25. Patient was discharged on home lactulose 20g TID and Rifaximin 550 mg BID.  ??  Recurrent hepatic hydrothorax: Patient has had multiple thoracentesis to drain recurrent hydrothorax, s/p TIPS on 1/15. CXR showed moderate to large right pleural effusion increased from prior compatible with hepatic hydrothorax. However, patient was febrile and hence thoracentesis was performed for diagnostic and therapeutic purposes in case patient had any underlying infection. 1100 mL of serous pleural fluid was removed on 3/20. The procedure was terminated due to symptoms of chest pain/pressure. Pleural fluid analysis suggested transudative (pseudo-exudative) in nature: fluid-to-serum protein >0.5 and fluid LDH >2/3 of normal LDH but was falsely positive for exudative given that patient had low protein concentration (<2.5 g/dL) in pleural fluid and a serum-to-pleural fluid albumin gradient >1.1 g/d similar to ascitic fluid, suggesting hepatic hydrothorax.   ??  Alcoholic cirrhosis: Patient denied EtOH use since May 2017 with liver synthetic function has slowly improved since then. Cirrhosis has been complicated by ascites requiring paracentesis and hydrothorax. No known varices. Patient is vaccinated for hep A but not B. MELD 13 in 12/11/17 at 19:15. Medicine procedure service was consulted for paracentesis. Patient did not have a sizeable pocket for paracentesis. Patient was discharged on home spironolactone 200mg  and lasix 40mg ??for fluid overload, ciprofloxacin for SBP prophylaxis and lactulose 20g TID and Rifaximin 550 mg BID. Patient needs MRI abdomen with and without contrast in order to follow-up lesion in segment 4 noted on study from 06/26/2017.   ??  Macrocytic anemia: Continued home B12 and iron  ??  Chronic pain:??Tylenol prn

## 2017-12-16 LAB — ETHYL GLUCURONIDE: Lab: NEGATIVE

## 2017-12-19 NOTE — Telephone Encounter (Signed)
Called and talked with patient and she states that she would like to come in and see Dr. Birdie SonsSonnenberg she feels like there other issues that could also be contributing to her blood sugars being off. Patient is scheduled for an appointment with Dr. Birdie SonsSonnenberg on 12-26-17 @ 11:30

## 2017-12-22 ENCOUNTER — Ambulatory Visit: Admit: 2017-12-22 | Discharge: 2017-12-23 | Payer: MEDICARE

## 2017-12-22 DIAGNOSIS — G629 Polyneuropathy, unspecified: Secondary | ICD-10-CM

## 2017-12-22 DIAGNOSIS — K7030 Alcoholic cirrhosis of liver without ascites: Principal | ICD-10-CM

## 2017-12-22 DIAGNOSIS — R630 Anorexia: Secondary | ICD-10-CM

## 2017-12-22 LAB — CBC
HEMOGLOBIN: 12.1 g/dL (ref 12.0–16.0)
MEAN CORPUSCULAR HEMOGLOBIN CONC: 32.4 g/dL (ref 31.0–37.0)
MEAN CORPUSCULAR HEMOGLOBIN: 28.7 pg (ref 26.0–34.0)
MEAN CORPUSCULAR VOLUME: 88.4 fL (ref 80.0–100.0)
MEAN PLATELET VOLUME: 7.5 fL (ref 7.0–10.0)
PLATELET COUNT: 234 10*9/L (ref 150–440)
RED BLOOD CELL COUNT: 4.21 10*12/L (ref 4.00–5.20)
RED CELL DISTRIBUTION WIDTH: 14.1 % (ref 12.0–15.0)
WBC ADJUSTED: 5.9 10*9/L (ref 4.5–11.0)

## 2017-12-22 LAB — AMMONIA
AMMONIA: 25 umol/L (ref 9–33)
Ammonia:SCnc:Pt:Plas:Qn:: 25

## 2017-12-22 LAB — COMPREHENSIVE METABOLIC PANEL
ALKALINE PHOSPHATASE: 164 U/L — ABNORMAL HIGH (ref 38–126)
ALT (SGPT): 37 U/L (ref 15–48)
ANION GAP: 6 mmol/L — ABNORMAL LOW (ref 9–15)
AST (SGOT): 68 U/L — ABNORMAL HIGH (ref 14–38)
BILIRUBIN TOTAL: 1.2 mg/dL (ref 0.0–1.2)
BLOOD UREA NITROGEN: 9 mg/dL (ref 7–21)
BUN / CREAT RATIO: 14
CALCIUM: 8.9 mg/dL (ref 8.5–10.2)
CHLORIDE: 104 mmol/L (ref 98–107)
CO2: 29 mmol/L (ref 22.0–30.0)
CREATININE: 0.63 mg/dL (ref 0.60–1.00)
EGFR MDRD NON AF AMER: >=60 mL/min/{1.73_m2} (ref >=60)
GLUCOSE RANDOM: 73 mg/dL (ref 65–99)
POTASSIUM: 4.1 mmol/L (ref 3.5–5.0)
PROTEIN TOTAL: 6.7 g/dL (ref 6.5–8.3)
SODIUM: 139 mmol/L (ref 135–145)

## 2017-12-22 LAB — PROTIME: Lab: 13.8 — ABNORMAL HIGH

## 2017-12-22 LAB — BILIRUBIN TOTAL: Bilirubin:MCnc:Pt:Ser/Plas:Qn:: 1.2

## 2017-12-22 LAB — PROTIME-INR: PROTIME: 13.8 s — ABNORMAL HIGH (ref 10.2–12.8)

## 2017-12-22 LAB — MEAN PLATELET VOLUME: Lab: 7.5

## 2017-12-22 MED ORDER — MIRTAZAPINE 7.5 MG TABLET
ORAL_TABLET | Freq: Every evening | ORAL | 3 refills | 0.00000 days | Status: CP
Start: 2017-12-22 — End: 2018-03-21

## 2017-12-22 MED ORDER — GABAPENTIN 100 MG CAPSULE
ORAL_CAPSULE | Freq: Three times a day (TID) | ORAL | 3 refills | 0.00000 days | Status: CP
Start: 2017-12-22 — End: 2018-12-19
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
## 2017-12-22 NOTE — Unmapped (Signed)
New Medications: Remeron 7.5mg  at bedtime    Gabapentin refill sent in.     These are the tests you have coming up: MRI    Referral to Psychologist to help with anxiety.       Please follow a 2000mg  sodium restricted diet  Increase your protein intake by eating eggs, chicken, fish, yogurt and/or supplements.       It is OK to take up to 2000mg  of acetaminophen (tylenol), please be cautious of combination products. Do not take ibuprofen, naproxen, aspirin, advil, aleve, motrin, Excedrin or headache powders. Avoid herbal or natural supplements.  If you have questions about over-the-counter medications please contact our nurse at [phone]    Please Return to clinic in 6 weeks  If you have not received a notice about a follow-up appointment please call the Liver Center at [phone]

## 2017-12-23 NOTE — Progress Notes (Deleted)
Cardiology Office Note  Date:  12/23/2017   ID:  Julie Graham, DOB 1966/05/25, MRN [id number]  PCP:  Leone Haven, MD   No chief complaint on file.   HPI:  Julie Graham is a 52 y.o. female  alcoholism/cirrhosis  TIPS procedure in January Confusion/encephalopathy On lactulose  on ciprofloxacin for SBP prophylaxis.   ammonia level was 200   Referred by Dr. Caryl Bis for palpitations, postural dizziness with presyncope    PMH:   has a past medical history of Alcoholism ([place]), Anal fissure, Anastomotic ulcer, acute, Anxiety, Blood in stool, Cirrhosis ([place]), Confusion, Depression, Heart murmur, History of pneumothorax (04/17/2017), Hypertension, Hypoalbuminemia, Portal hypertension ([place]), SBP (spontaneous bacterial peritonitis) ([place]) (05/12/2016), and UTI (lower urinary tract infection).  PSH:    Past Surgical History:  Procedure Laterality Date  . CESAREAN SECTION  1989  . CHOLECYSTECTOMY    . GASTRIC BYPASS  2009  . IR GENERIC HISTORICAL  06/02/2016   IR TRANSCATHETER BX 06/02/2016 Marybelle Killings, MD [facility]-INTERV RAD    Current Outpatient Medications  Medication Sig Dispense Refill  . ACCU-CHEK FASTCLIX LANCETS MISC Check once daily, E16.2 102 each 3  . Ascorbic Acid (VITAMIN C IMMUNE HEALTH PO) Take by mouth.    . blood glucose meter kit and supplies KIT Check once daily if feeling lightheaded. E16.2. 1 each 0  . ciprofloxacin (CIPRO) 500 MG tablet Take 1 tablet (500 mg total) by mouth daily. 180 tablet 0  . Cyanocobalamin (VITAMIN B-12 CR PO) Take by mouth.    . docusate sodium (COLACE) 100 MG capsule Take 100 mg by mouth 2 (two) times daily.    . folic acid (FOLVITE) 1 MG tablet TAKE 1 TABLET BY MOUTH  DAILY 90 tablet 0  . furosemide (LASIX) 20 MG tablet Take 3 tablets (60 mg total) by mouth 2 (two) times daily. 180 tablet 0  . glucose blood test strip accu chek once daily, E16.2 100 each 12  . Lactulose 20 GM/30ML SOLN Take 30 mLs (20 g total) by mouth daily. 30 mL 3  .  ondansetron (ZOFRAN) 4 MG tablet Take 1 tablet (4 mg total) by mouth 2 (two) times daily as needed for nausea or vomiting. (Patient taking differently: Take 8 mg by mouth 2 (two) times daily as needed for nausea or vomiting. ) 20 tablet 0  . spironolactone (ALDACTONE) 100 MG tablet Take 1 tablet (100 mg total) by mouth 2 (two) times daily. 180 tablet 0  . traMADol (ULTRAM) 50 MG tablet Take 1 tablet (50 mg total) by mouth every 8 (eight) hours as needed for moderate pain. 30 tablet 0  . triamcinolone cream (KENALOG) 0.1 % Apply 1 application topically 2 (two) times daily. 30 g 0   Current Facility-Administered Medications  Medication Dose Route Frequency Provider Last Rate Last Dose  . 0.9 %  sodium chloride infusion  500 mL Intravenous Continuous Pyrtle, Lajuan Lines, MD      . albumin human 25 % solution 50 g  50 g Intravenous UD Pyrtle, Lajuan Lines, MD         Allergies:   Sulfa antibiotics   Social History:  The patient  reports that she has never smoked. She has never used smokeless tobacco. She reports that she does not drink alcohol or use drugs.   Family History:   family history includes Alcoholism in her maternal grandfather; Arthritis in her father and mother; Breast cancer in her cousin; COPD in her father and mother; Diabetes in  her father and mother; Factor V Leiden deficiency in her father; Heart disease in her father; Heart failure in her mother; Hyperlipidemia in her father and mother; Hypertension in her father and mother; Lung cancer in her maternal grandmother; Lymphoma in her mother; Stroke in her maternal grandfather and mother; Sudden death in her paternal uncle.    Review of Systems: ROS   PHYSICAL EXAM: VS:  There were no vitals taken for this visit. , BMI There is no height or weight on file to calculate BMI. GEN: Well nourished, well developed, in no acute distress  HEENT: normal  Neck: no JVD, carotid bruits, or masses Cardiac: RRR; no murmurs, rubs, or gallops,no edema   Respiratory:  clear to auscultation bilaterally, normal work of breathing GI: soft, nontender, nondistended, + BS MS: no deformity or atrophy  Skin: warm and dry, no rash Neuro:  Strength and sensation are intact Psych: euthymic mood, full affect    Recent Labs: 07/18/2017: ALT 15; Hemoglobin 12.6; Platelets 185.0 11/13/2017: BUN 13; Creatinine, Ser 0.70; Potassium 4.9; Sodium 137    Lipid Panel No results found for: CHOL, HDL, LDLCALC, TRIG    Wt Readings from Last 3 Encounters:  11/13/17 178 lb (80.7 kg)  07/18/17 170 lb 9.6 oz (77.4 kg)  04/17/17 174 lb 3.2 oz (79 kg)       ASSESSMENT AND PLAN:  No diagnosis found.   Disposition:   F/U  6 months  No orders of the defined types were placed in this encounter.    Signed, Esmond Plants, M.D., Ph.D. 12/23/2017  [place], [place]

## 2017-12-25 ENCOUNTER — Ambulatory Visit: Payer: Medicaid Other | Admitting: Cardiovascular Disease

## 2017-12-26 ENCOUNTER — Ambulatory Visit: Payer: Medicaid Other | Admitting: Family Medicine

## 2017-12-26 ENCOUNTER — Encounter: Payer: Self-pay | Admitting: Cardiovascular Disease

## 2017-12-27 ENCOUNTER — Ambulatory Visit: Payer: Medicaid Other | Admitting: Family Medicine

## 2017-12-27 DIAGNOSIS — R4182 Altered mental status, unspecified: Principal | ICD-10-CM

## 2017-12-28 ENCOUNTER — Ambulatory Visit
Admit: 2017-12-28 | Discharge: 2017-12-31 | Disposition: A | Discharge status: Home / Self Care | Payer: MEDICARE | Admitting: Internal Medicine

## 2017-12-28 DIAGNOSIS — R4182 Altered mental status, unspecified: Principal | ICD-10-CM

## 2017-12-28 LAB — BILIRUBIN TOTAL: Bilirubin:MCnc:Pt:Ser/Plas:Qn:: 1.3 — ABNORMAL HIGH

## 2017-12-28 LAB — CBC W/ AUTO DIFF
BASOPHILS ABSOLUTE COUNT: 0.1 10*9/L (ref 0.0–0.1)
BASOPHILS RELATIVE PERCENT: 0.8 %
EOSINOPHILS ABSOLUTE COUNT: 0.1 10*9/L (ref 0.0–0.4)
EOSINOPHILS RELATIVE PERCENT: 1.5 %
HEMATOCRIT: 40 % (ref 36.0–46.0)
LARGE UNSTAINED CELLS: 4 % (ref 0–4)
LYMPHOCYTES ABSOLUTE COUNT: 1.9 10*9/L (ref 1.5–5.0)
LYMPHOCYTES RELATIVE PERCENT: 31.1 %
MEAN CORPUSCULAR HEMOGLOBIN CONC: 32.2 g/dL (ref 31.0–37.0)
MEAN CORPUSCULAR HEMOGLOBIN: 29.2 pg (ref 26.0–34.0)
MONOCYTES ABSOLUTE COUNT: 0.4 10*9/L (ref 0.2–0.8)
MONOCYTES RELATIVE PERCENT: 6.3 %
NEUTROPHILS ABSOLUTE COUNT: 3.5 10*9/L (ref 2.0–7.5)
NEUTROPHILS RELATIVE PERCENT: 56.6 %
PLATELET COUNT: 265 10*9/L (ref 150–440)
RED BLOOD CELL COUNT: 4.41 10*12/L (ref 4.00–5.20)
RED CELL DISTRIBUTION WIDTH: 14.4 % (ref 12.0–15.0)
WBC ADJUSTED: 6.1 10*9/L (ref 4.5–11.0)

## 2017-12-28 LAB — URINALYSIS WITH CULTURE REFLEX
BACTERIA: NONE SEEN /HPF
BILIRUBIN UA: NEGATIVE
GLUCOSE UA: NEGATIVE
KETONES UA: NEGATIVE
LEUKOCYTE ESTERASE UA: NEGATIVE
NITRITE UA: NEGATIVE
PH UA: 7 (ref 5.0–9.0)
PROTEIN UA: NEGATIVE
RBC UA: <1 /HPF (ref <=4)
SPECIFIC GRAVITY UA: 1.012 (ref 1.003–1.030)
SQUAMOUS EPITHELIAL: <1 /HPF (ref 0–5)
WBC UA: 1 /HPF (ref 0–5)

## 2017-12-28 LAB — TOXICOLOGY SCREEN, URINE
AMPHETAMINE SCREEN URINE: <500
BARBITURATE SCREEN URINE: <200
CANNABINOID SCREEN URINE: <20
COCAINE(METAB.)SCREEN, URINE: <150

## 2017-12-28 LAB — COMPREHENSIVE METABOLIC PANEL
ALBUMIN: 2.9 g/dL — ABNORMAL LOW (ref 3.5–5.0)
ALBUMIN: 2.4 g/dL — ABNORMAL LOW (ref 3.5–5.0)
ALKALINE PHOSPHATASE: 133 U/L — ABNORMAL HIGH (ref 38–126)
ALKALINE PHOSPHATASE: 112 U/L (ref 38–126)
ALT (SGPT): 44 U/L (ref 15–48)
ALT (SGPT): 40 U/L (ref 15–48)
ANION GAP: 6 mmol/L — ABNORMAL LOW (ref 9–15)
ANION GAP: 7 mmol/L — ABNORMAL LOW (ref 9–15)
AST (SGOT): 65 U/L — ABNORMAL HIGH (ref 14–38)
AST (SGOT): 53 U/L — ABNORMAL HIGH (ref 14–38)
BILIRUBIN TOTAL: 1.3 mg/dL — ABNORMAL HIGH (ref 0.0–1.2)
BILIRUBIN TOTAL: 1.2 mg/dL (ref 0.0–1.2)
BLOOD UREA NITROGEN: 11 mg/dL (ref 7–21)
BLOOD UREA NITROGEN: 11 mg/dL (ref 7–21)
BUN / CREAT RATIO: 16
BUN / CREAT RATIO: 21
CALCIUM: 8.8 mg/dL (ref 8.5–10.2)
CALCIUM: 8.2 mg/dL — ABNORMAL LOW (ref 8.5–10.2)
CHLORIDE: 113 mmol/L — ABNORMAL HIGH (ref 98–107)
CHLORIDE: 117 mmol/L — ABNORMAL HIGH (ref 98–107)
CO2: 21 mmol/L — ABNORMAL LOW (ref 22.0–30.0)
CO2: 19 mmol/L — ABNORMAL LOW (ref 22.0–30.0)
CREATININE: 0.7 mg/dL (ref 0.60–1.00)
CREATININE: 0.53 mg/dL — ABNORMAL LOW (ref 0.60–1.00)
EGFR MDRD AF AMER: >=60 mL/min/{1.73_m2} (ref >=60)
EGFR MDRD AF AMER: >=60 mL/min/{1.73_m2} (ref >=60)
EGFR MDRD NON AF AMER: >=60 mL/min/{1.73_m2} (ref >=60)
GLUCOSE RANDOM: 83 mg/dL (ref 65–179)
POTASSIUM: 4.4 mmol/L (ref 3.5–5.0)
POTASSIUM: 3.9 mmol/L (ref 3.5–5.0)
PROTEIN TOTAL: 6.5 g/dL (ref 6.5–8.3)
PROTEIN TOTAL: 5.9 g/dL — ABNORMAL LOW (ref 6.5–8.3)
SODIUM: 140 mmol/L (ref 135–145)

## 2017-12-28 LAB — LACTATE BLOOD VENOUS: Lactate:SCnc:Pt:BldV:Qn:: 1.7

## 2017-12-28 LAB — MAGNESIUM: Magnesium:MCnc:Pt:Ser/Plas:Qn:: 1.9

## 2017-12-28 LAB — RED BLOOD CELL COUNT: Lab: 4.41

## 2017-12-28 LAB — PROTIME: Lab: 15.7 — ABNORMAL HIGH

## 2017-12-28 LAB — COCAINE(METAB.)SCREEN, URINE: Lab: <150

## 2017-12-28 LAB — AMMONIA: Ammonia:SCnc:Pt:Plas:Qn:: 192 — ABNORMAL HIGH

## 2017-12-28 LAB — COLOR

## 2017-12-28 LAB — LIPASE: Triacylglycerol lipase:CCnc:Pt:Ser/Plas:Qn:: 305 — ABNORMAL HIGH

## 2017-12-28 LAB — ETHANOL: Ethanol:MCnc:Pt:Ser/Plas:Qn:GC: <10

## 2017-12-28 LAB — PREGNANCY TEST URINE: Lab: NEGATIVE

## 2017-12-28 LAB — PHOSPHORUS: Phosphate:MCnc:Pt:Ser/Plas:Qn:: 3.7

## 2017-12-28 LAB — PROTEIN TOTAL: Protein:MCnc:Pt:Ser/Plas:Qn:: 5.9 — ABNORMAL LOW

## 2017-12-28 NOTE — Unmapped (Signed)
No changes in Pt assessment. Pt remains largely non-verbal and drowsy,

## 2017-12-28 NOTE — Unmapped (Signed)
Pt in stretcher, airway intact, breathing regular and unlabored, bed in low position and locked. Pt needs, belongings, pain, toileting addressed. Pt updated on plan of care. Awaiting further orders from LIP.

## 2017-12-28 NOTE — Unmapped (Signed)
Patient rounds completed. The following patient needs were addressed:  Personal Belongings, Plan of Care, Call Bell in Reach and Bed Position Low     Pt laying in bed growling. Occasional movements to pull NG .

## 2017-12-28 NOTE — Unmapped (Signed)
ASSESSMENT OF CHIEF COMPLAINT:      Constitutional: .Pt sedated at this time and somewhat difficult to arouse. Non-toxic appearing, NAD.  Airway: Patent without interventions, Pt managing own secretions.  Respiratory: Pt's breathing regular, even, and unlabored. Equal chest rise. Lungs clear to auscultation in all fields. No noted cough. Pt does not endorse any SOB. Perfusion appears adequate, capillary refill <3 seconds.   Cardiovascular: Pt Cardiac rhythm and rate regular, S1&S2 present on auscultation. NSR on the monitor, with no noted ectopies. Pt normotensive. Skin appropriate for ethnicity. +2 pulses BUE.  Neuro: Pt largely difficult to arouse. Growls with stimulation. Purposeful movements toward removing NG tube  ENT:       Head: Normocephalic and atraumatic.       Nose: Intact, no noted drainage or congestion.       Mouth/Throat: Mucous membranes are moist and intact. No difficulty or pain noted with swallowing.       Neck: Trachea Midline, no noted lymphadenopathy  Gastrointestinal: Soft and nontender. No stated nausea, vomiting, Diarrhea, constipation, or loss of appettite  Musculoskeletal: Normal range of motion in all extremities. Active and Passive movement all extremities able to overcome gravity and resistance. Peripheral pulses intact.   Skin: Scattered Ecchymoses on legs and arms

## 2017-12-28 NOTE — Unmapped (Signed)
Report received from Juliet Rude RN. Patient care transferred at this time.

## 2017-12-28 NOTE — Unmapped (Signed)
[facility] Emergency Department Provider Note    ED Clinical Impression     Final diagnoses:   Hepatic encephalopathy (CMS-HCC) (Primary)   Altered mental status, unspecified altered mental status type       Initial Impression, ED Course, Assessment and Plan     Impression: 52 year old female history of substance abuse, hepatic encephalopathy, liver failure who presents for evaluation of altered mental status.  On patient's arrival to the ED patient is aggressive, altered, continually trying to jump out of the bed.  When trying to examine her she is combative and flailing with fists.  Unable to obtain history.  Due to patient and staff safety was given IM Versed and Haldol.  Put in physical restraints.  On history of unable to obtain as far as that patient likely fell.  Will obtain workup for altered mental status and once patient is able we will send to CT scan to ensure no intracranial pathology.     Patient continues to yell and scream, curse words, fighting restraints.  Given another dose of IM Versed and Haldol.  Was able to obtain labs.  Will wait for results, still able to go to CT scan due to agitation.    Labs show no elevated white count, normal hemoglobin.  Electrolytes unremarkable, ammonia 192.  Likely cause of altered mental status.  Was able to obtain a head CT which did not show any evidence of bleed.  Chest x-ray shows right-sided pleural effusion is consistent with prior.  Patient does have a known history of a pleural effusion that requires intermittent thoracentesis.  We will plan to admit for hepatic encephalopathy.      ____________________________________________    Time seen: December 28, 2017 1:45 AM    Additional Medical Decision Making     I have reviewed the vital signs and the nursing notes. Labs and radiology results that were available during my care of the patient were independently reviewed by me and considered in my medical decision making.     I staffed the case with the ED attending, Dr. Nemiah Commander.      Portions of this record have been created using Scientist, clinical (histocompatibility and immunogenetics). Dictation errors have been sought, but may not have been identified and corrected.  ____________________________________________            History     Chief Complaint  Altered Mental Status      HPI   Verlia Kaney is a 52 y.o. female (history limited due to patient's altered mental status) per EMS there called out to the patient's home by family member due to altered mental status and a fall.  On arrival seen patient was aggressive difficulty cooperating.  Mental status waxing and waning though protecting airway.  Due to severity of agitation EMS was unable to obtain vital signs however patient was falling around appear to be no immediate respiratory or cardiac arrest.  Does have a history of hepatic encephalopathy, supposed be taking lactulose.  No obvious trauma was noted on scene, also no evidence of pill bottles or other findings concerning for ingestions.          Past Medical History:   Diagnosis Date   ??? Anemia    ??? Cirrhosis (CMS-HCC)    ??? Heart murmur        Past Surgical History:   Procedure Laterality Date   ??? CESAREAN SECTION  1989   ??? CHOLECYSTECTOMY  1993   ??? GASTRIC BYPASS  2009   ???  IR TIPS  10/10/2017    IR TIPS 10/10/2017 Rush Barer, MD IMG [facility]   ??? PR DEBRIDEMENT, SKIN, SUB-Q TISSUE,MUSCLE,BONE,=<20 SQ CM Right 10/24/2016    Procedure: RLE WD DEBRIDEMENT;  Surgeon: Boykin Reaper, MD;  [facility];  Service: Vascular   ??? PR THORACENTESIS NEEDLE/CATH PLEURA W/IMAGING N/A 12/13/2017    Procedure: THORACENTESIS W/ IMAGING;  Surgeon: Maxwell Caul, MD;  Location: BRONCH PROCEDURE LAB [facility];  Service: Pulmonary         Current Facility-Administered Medications:   ???  haloperidol lactate (HALDOL) injection 5 mg, 5 mg, Intramuscular, Q6H PRN, Lindalou Hose, MD, 5 mg at 12/27/17 2348  ???  midazolam (VERSED) injection 5 mg, 5 mg, Intravenous, Once, Alene Mires, MD, Stopped at 12/28/17 0121    Current Outpatient Prescriptions:   ???  ciprofloxacin HCl (CIPRO) 500 MG tablet, Take 1 tablet (500 mg total) by mouth daily., Disp: 30 tablet, Rfl: 0  ???  folic acid (FOLVITE) 1 MG tablet, Take 1 tablet (1 mg total) by mouth daily., Disp: 30 tablet, Rfl: 5  ???  furosemide (LASIX) 40 MG tablet, Take 1 tablet (40 mg total) by mouth daily., Disp: 30 tablet, Rfl: 11  ???  gabapentin (NEURONTIN) 100 MG capsule, Take 1 capsule (100 mg total) by mouth Three (3) times a day., Disp: 270 capsule, Rfl: 3  ???  lactulose (CHRONULAC) 10 gram/15 mL solution, Take 30 mL (20 g total) by mouth Three (3) times a day., Disp: 2700 mL, Rfl: 11  ???  mirtazapine (REMERON) 7.5 MG tablet, Take 1 tablet (7.5 mg total) by mouth nightly., Disp: 90 tablet, Rfl: 3  ???  rifAXIMin (XIFAXAN) 550 mg Tab, Take 1 tablet (550 mg total) by mouth Two (2) times a day., Disp: 180 tablet, Rfl: 4  ???  spironolactone (ALDACTONE) 100 MG tablet, Take 2 tablets (200 mg total) by mouth daily., Disp: 60 tablet, Rfl: 5    Allergies  Sulfa (sulfonamide antibiotics) and Cymbalta [duloxetine]    Family History   Problem Relation Age of Onset   ??? Diabetes type II Mother    ??? Heart failure Mother    ??? Diabetes type II Father    ??? Heart failure Father    ??? COPD Father        Social History  Social History   Substance Use Topics   ??? Smoking status: Never Smoker   ??? Smokeless tobacco: Never Used   ??? Alcohol use No       Review of Systems  A full 12 point review of systems was negative except for those mentioned in the above HPI.    Physical Exam     VITAL SIGNS:    ED Triage Vitals   Enc Vitals Group      BP 12/27/17 2310 152/81      Heart Rate 12/27/17 2310 69      SpO2 Pulse 12/28/17 0000 84      Resp 12/27/17 2310 17      Temp 12/27/17 2335 36.4 ??C (97.5 ??F)      Temp Source 12/27/17 2335 Axillary      SpO2 12/27/17 2310 100 %      Weight --       Height --       Head Circumference --       Peak Flow --       Pain Score --       Pain Loc --  Pain Edu? --       Excl. in GC? --        Constitutional: Altered, unable to answer questions, only curses and flails arms when trying to examine  Eyes: Conjunctivae are normal.  ENT       Head: Normocephalic and atraumatic  Cardiovascular: Normal rate, regular rhythm. Normal and symmetric distal pulses are present in all extremities.  Respiratory: Normal respiratory effort. Breath sounds are normal.  Gastrointestinal: Soft, nontender  Musculoskeletal: Nontender with normal range of motion in all extremities. No edema in lower extremities.  Neurologic: Normal speech and language. No gross focal neurologic deficits are a altered, no focal neurological deficits appreciated  Skin: Skin is warm, dry and intact. No rash noted.  Psychiatric: Altered, combative         Harriet Pho, MD  Resident  12/28/17 [phone]

## 2017-12-28 NOTE — Unmapped (Signed)
Pt noted to be reaching for NG tube and attempting to pull it out. Pt placed back in soft wrist restraints. MD notified for order

## 2017-12-28 NOTE — Unmapped (Signed)
Patient rounds completed. The following patient needs were addressed:  Personal Belongings, Plan of Care, Call Bell in Reach and Bed Position Low . Admit team at bedside
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
## 2017-12-28 NOTE — Unmapped (Signed)
Patient rounds completed. The following patient needs were addressed:  pt sleeping, quiet at the moment, q15 minute checks

## 2017-12-28 NOTE — Unmapped (Signed)
Patient rounds completed. The following patient needs were addressed:  Personal Belongings, Plan of Care, Call Bell in Reach and Bed Position Low     Pt laying in bed, continues to growl.  Marland Kitchen

## 2017-12-28 NOTE — Unmapped (Signed)
Report given to Leanord Hawking. Patient care transferred at this time.

## 2017-12-28 NOTE — Unmapped (Signed)
Patient transported to CT Scan  Transported by Nurse and Physician  How tranported Stretcher  Cardiac Monitor yes

## 2017-12-28 NOTE — Unmapped (Signed)
Pt given second dose of sedation meds to reach therapeutic dose. Pt still combative and altered at this time.

## 2017-12-28 NOTE — Unmapped (Signed)
Report called to [facility]

## 2017-12-28 NOTE — Unmapped (Signed)
No changes in Pt assessment

## 2017-12-28 NOTE — Unmapped (Signed)
Pt sleeping at this time. Previously sedated for erratic behavior. will complete full assessment upon awakening. NAD at this time

## 2017-12-28 NOTE — Unmapped (Signed)
Bed: 23-B  Expected date:   Expected time:   Means of arrival:   Comments:  Per charge

## 2017-12-28 NOTE — Unmapped (Signed)
NG tube assessed via auscultation for proper placement

## 2017-12-28 NOTE — Unmapped (Signed)
Pt bibEMS from home r/t AMS and combativeness. Hx of liver problems. Pt currently drowsy.

## 2017-12-28 NOTE — Unmapped (Signed)
Md attempting to obtain verbal hx from pt at this time.

## 2017-12-28 NOTE — Unmapped (Signed)
RN to Pt room. Pt found to be in 2 point soft wrist restraints. As Pt sleeping. This RN unfastened restraints.

## 2017-12-28 NOTE — Unmapped (Addendum)
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  ??  Hepatic Encephalopathy: ammonia 192 on admission (25 one week prior on 3/29) in setting of significant history of medication noncompliance. Agitated behavior on presentation and was given versed and haldol in ED subsequently was sedated and unable to tolerate PO so NG tube was placed for lactulose with goal of 4-5 BMs/day and Rifaximin. On 4/5, patient was more alert and fully oriented so NG tube was removed and she was transitioned to PO meds. Patient continued to improve and was educated on medication compliance prior to discharge.   ??  Alcoholic cirrhosis: From prior notes, no alcohol use since May 2017. History of decompensation with ascites and encephalopathy with recurrent hepatic hydrothorax w/ last thora on 3/20 w/ 1100 ml out. CXR this admission showed improvement in hydrothorax compared to prior. S/p TIPS 10/10/17. MELD-Na 11 on arrival. Patient was continued on lasix 40 daily and spiro 200 daily as well as cipro for SBP ppx.   ??  Chronic pain: Home gabapentin was initially held due to AMS but restarted prior to discharge.  Anxiety/depression: Held home remeron but was restarted once patient was more alert

## 2017-12-28 NOTE — Unmapped (Signed)
Patient rounds completed. The following patient needs were addressed:  Personal Belongings, Plan of Care, Call Bell in Reach and Bed Position Low     Pt resting in bed, eyes closed. Breathing even and unlabored .

## 2017-12-28 NOTE — Unmapped (Signed)
NG advanced 9cm per order of admit team to 74 at the nare. NG placement confirmed by auscultation.

## 2017-12-28 NOTE — Unmapped (Signed)
Pt given medications in attempt to sedate for testing.

## 2017-12-29 DIAGNOSIS — R4182 Altered mental status, unspecified: Principal | ICD-10-CM

## 2017-12-29 LAB — COMPREHENSIVE METABOLIC PANEL
ALBUMIN: 3 g/dL — ABNORMAL LOW (ref 3.5–5.0)
ALT (SGPT): 44 U/L (ref 15–48)
ANION GAP: 6 mmol/L — ABNORMAL LOW (ref 9–15)
AST (SGOT): 54 U/L — ABNORMAL HIGH (ref 14–38)
BILIRUBIN TOTAL: 1.6 mg/dL — ABNORMAL HIGH (ref 0.0–1.2)
BLOOD UREA NITROGEN: 11 mg/dL (ref 7–21)
BUN / CREAT RATIO: 17
CALCIUM: 9 mg/dL (ref 8.5–10.2)
CHLORIDE: 115 mmol/L — ABNORMAL HIGH (ref 98–107)
CO2: 22 mmol/L (ref 22.0–30.0)
CREATININE: 0.65 mg/dL (ref 0.60–1.00)
EGFR MDRD NON AF AMER: >=60 mL/min/{1.73_m2} (ref >=60)
GLUCOSE RANDOM: 104 mg/dL (ref 65–179)
PROTEIN TOTAL: 6.8 g/dL (ref 6.5–8.3)
SODIUM: 143 mmol/L (ref 135–145)

## 2017-12-29 LAB — INR: Lab: 1.36

## 2017-12-29 LAB — CBC
HEMATOCRIT: 39 % (ref 36.0–46.0)
HEMOGLOBIN: 12.7 g/dL (ref 12.0–16.0)
MEAN CORPUSCULAR HEMOGLOBIN CONC: 32.6 g/dL (ref 31.0–37.0)
MEAN CORPUSCULAR HEMOGLOBIN: 29.1 pg (ref 26.0–34.0)
MEAN CORPUSCULAR VOLUME: 89.3 fL (ref 80.0–100.0)
MEAN PLATELET VOLUME: 7.2 fL (ref 7.0–10.0)
PLATELET COUNT: 291 10*9/L (ref 150–440)
RED BLOOD CELL COUNT: 4.36 10*12/L (ref 4.00–5.20)

## 2017-12-29 LAB — MEAN CORPUSCULAR VOLUME: Lab: 89.3

## 2017-12-29 LAB — PROTEIN TOTAL: Protein:MCnc:Pt:Ser/Plas:Qn:: 6.8

## 2017-12-29 LAB — PROTIME-INR: PROTIME: 15.5 s — ABNORMAL HIGH (ref 10.2–12.8)

## 2017-12-29 NOTE — Unmapped (Signed)
Problem: Patient Care Overview  Goal: Plan of Care Review  Outcome: Progressing  Pt is drowsy to alert. Mentation clearing. Oriented to self, place, and year. Pt is delayed in response and forgetful. Remains in NSR- low ST with rate in 80's-110 bpm. BP stable. Afebrile. No respiratory distress or oxygen requirement to this hour. Denies pain. Pt has had several loose BM's. Lactulose order changed from scheduled to PRN for 4-5 BM's. Pt is voiding and has not required I&O cath to this hour. Developed a dry cough with intermittent chest pain. MDW Marcie Bal) notified. Pt went off unit for chest x-ray. Ambulates in room and to [facility]. Fall precautions in place. Sq lovenox administered for VTE prophylaxis. Transfer orders in for 8BT.   Goal: Individualization and Mutuality  Outcome: Progressing    Goal: Discharge Needs Assessment  Outcome: Progressing    Goal: Interprofessional Rounds/Family Conf  Outcome: Progressing      Problem: Fall Risk (Adult)  Goal: Identify Related Risk Factors and Signs and Symptoms  Related risk factors and signs and symptoms are identified upon initiation of Human Response Clinical Practice Guideline (CPG).   Outcome: Progressing    Goal: Absence of Fall  Patient will demonstrate the desired outcomes by discharge/transition of care.   Outcome: Progressing      Problem: Skin Injury Risk (Adult)  Goal: Identify Related Risk Factors and Signs and Symptoms  Related risk factors and signs and symptoms are identified upon initiation of Human Response Clinical Practice Guideline (CPG).   Outcome: Progressing    Goal: Skin Health and Integrity  Patient will demonstrate the desired outcomes by discharge/transition of care.   Outcome: Progressing      Problem: VTE, DVT and PE (Adult)  Goal: Signs and Symptoms of Listed Potential Problems Will be Absent, Minimized or Managed (VTE, DVT and PE)  Signs and symptoms of listed potential problems will be absent, minimized or managed by discharge/transition of care (reference VTE, DVT and PE (Adult) CPG).  Outcome: Progressing      Problem: Confusion, Acute (Adult)  Goal: Identify Related Risk Factors and Signs and Symptoms  Related risk factors and signs and symptoms are identified upon initiation of Human Response Clinical Practice Guideline (CPG).  Outcome: Progressing    Goal: Cognitive/Functional Impairments Minimized  Patient will demonstrate the desired outcomes by discharge/transition of care.  Outcome: Progressing    Goal: Safety  Patient will demonstrate the desired outcomes by discharge/transition of care.  Outcome: Progressing

## 2017-12-29 NOTE — Unmapped (Addendum)
Care Management  Initial Transition Planning Assessment    No acute events overnight. Patient more alert this morning and was oriented to person, place, and time for me. She had 6 bowel movements since midnight after lactulose enema yesterday. Patient's only complaint is sore throat likely due to NG tube. We discussed removing this today. No indication for CIWA as patient reports last drink in May 2017.              General  Care Manager assessed the patient by : In person interview with patient, In person interview with family, Medical record review, Discussion with Clinical Care team  Orientation Level:  (not assessed, mental status improving per nursing)    Contact/Decision Maker:    Contact Details  Contact Details: Primary Contact, Secondary Contact  Primary Contact Name: June Beck  Primary Contact Relationship: Sibling  Phone #1: [phone]  Secondary Contact Name: Sharika Mosquera  Secondary Contact Relationship: Son  Phone #3: [phone]    Advance Directive (Medical Treatment)  Does patient have an advance directive covering medical treatment?: Patient does not have advance directive covering medical treatment., Patient would like information.  Reason patient does not have an advance directive covering medical treatment:: Patient needs follow-up to complete one.  Information provided on advance directive:: Other (Comment) (Unable to provide education at this time)  Patient requests assistance:: Yes, referral made to case manager    Advance Directive (Mental Health Treatment)  Does patient have an advance directive covering mental health treatment?: Patient does not have advance directive covering mental health treatment.  Reason patient does not have an advance directive covering mental health treatment:: Patient needs follow-up to complete one.    Patient Information:    Lives with: Children, Family members.Sister June and her spouse    Type of Residence: Private residence     Type of Residence: Mailing Address:  [address]  [address]  [place] [address]  Patient Phone Number: [phone] (home)           Medical Provider(s): ERIC Gelene Mink, MD  Reason for Admission: Admitting Diagnosis:  Hepatic encephalopathy (CMS-HCC) [K72.90]  Altered mental status, unspecified altered mental status type [R41.82]  Past Medical History:   has a past medical history of Anemia; Cirrhosis (CMS-HCC); and Heart murmur.  Past Surgical History:   has a past surgical history that includes Gastric bypass (2009); Cholecystectomy (1993); Cesarean section (1989); pr debridement, skin, sub-q tissue,muscle,bone,=<20 sq cm (Right, 10/24/2016); IR Tips (10/10/2017); and pr thoracentesis needle/cath pleura w/imaging (N/A, 12/13/2017).   Previous admit date: 12/11/2017    Primary Insurance- Payor: MEDICAID [place] / Plan: MEDICAID [place] ACCESS / Product Type: *No Product type* /   Secondary Insurance ??? None  Prescription Coverage ??? Yes  Preferred Pharmacy - [id number] - [place], [place] - [address]  [id number] - [place] - [address]  [facility] - [place], [place] - [address]  [facility] - [place], [place] - [address]    Transportation home: Private vehicle  Level of function prior to admission: Independent    Support Systems: Children, Family Members, Significant Other    Responsibilities/Dependents at home?:  (11 yo son)    Home Care services in place prior to admission?: No          Outpatient/Community Resources in place prior to admission: Clinic       Equipment Currently Used at Home:  walker, rolling       Currently receiving outpatient dialysis?: No       Financial Information:          Need for financial assistance?: No       Discharge Needs Assessment:    Concerns to be Addressed: care coordination/care conferences    Clinical Risk Factors: Multiple Diagnoses (Chronic), Readmission < 30 Days    Barriers to taking medications: No    Prior overnight hospital stay or ED visit in last 90 days: Yes    Readmission Within the Last 30 Days: previous discharge plan unsuccessful (Pt continued pattern of noncompliance with medication)         Anticipated Changes Related to Illness: none    Equipment Needed After Discharge: none    Discharge Facility/Level of Care Needs:  (home home health)         Discharge Plan: home with Home Health (no preference of agencies)         Expected Discharge Date: 01/01/18    Expected Transfer from Critical Care: 12/30/17            Initial Assessment complete?: Yes

## 2017-12-29 NOTE — Unmapped (Signed)
Medicine (Med W) Daily Progress Note    Subjective:  No acute events overnight. Patient more alert this morning and was oriented to person, place, and time for me. She had 6 bowel movements since midnight after lactulose enema yesterday. Patient's only complaint is sore throat likely due to NG tube. We discussed removing this today. No indication for CIWA as patient reports last drink in May 2017.  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  ??  Hepatic Encephalopathy: ammonia 192 on admission in setting of significant history of medication noncompliance. Sedated upon admission for agitated behavior and is now more alert and oriented.   - bedside abdominal ultrasound revealed no fluid pocket to tap for diag para  - remove NG tube; transition to PO   - decrease lactulose 40g q4hrs to TID with goal 4-5 BMs/day  - continue rifaximin 550mg  TID  ??  Alcoholic cirrhosis: S/p TIPS 10/10/17. MELD-Na 11 on arrival. Reported no alcohol use since May 2017. Complicated by ascites, encephalopathy, recurrent hepatic hydrothorax (last thora on 3/20 w/ 1100 ml out). No hx of varices. CXR this admission showed improvement in hydrothorax compared to prior.   - continuing lasix 40 daily  - continuing spiro 200 daily  - continuing cipro for SBP ppx  - Consider getting MRI inpatient that was scheduled for outpatient (evaluate lesion in segment 4 noted 06/2017)  ??  Chronic pain: Holding home gabapentin initially   Anxiety/depression: Holding home remeron for now (started at visit on 3/29 for anxiety)    Diet: NPO for now  DVT Ppx: Lovenox  GI Ppx: Not indicated    Labs/Studies:  Labs and Studies from the last 24hrs per EMR and Reviewed    Objective:  Temp:  [36.4 ??C-37.1 ??C] 37.1 ??C  Heart Rate:  [68-112] 102  SpO2 Pulse:  [68-109] 101  Resp:  [13-26] 25  BP: (129-181)/(77-91) 168/83  SpO2:  [94 %-100 %] 98 %    GEN: lying in bed with NG tube in place, no acute distress, appears confused  EYES: mild sceral icterus  ENT: MMM, NG tube in place  CV: RRR, no murmurs appreciated  PULM: CTAB   ABD: soft, NT/ND, +BS, negative fluid wave  EXT: No edema, mottled appearing pigmentation along bilateral lower extremities (appears chronic)

## 2017-12-29 NOTE — Unmapped (Signed)
Problem: Patient Care Overview  Goal: Plan of Care Review  Outcome: Progressing  Patient alert to self only began shift disoriented x4 but gradually started responding to her name by end of shift and answering yes or no questions appropriately but would not elaborate on answers when asked by RN therefore difficult to assess if mental status is clearing. Sinus tach in 110s, hypertensive with SBPs in 170s/low 180s but below call parameters on room air. Pt continued to try to pull at NG tube and attempt to get out of bed. Bilat wrist restraints continued for that reason. Lactulose enema given at 1900 and lactulose continued Q4 afterwards. Pt had 5 Bms during shift.  Bladder scanned and I/O cath'd 425 out. Pt did not urinate on own at all during shift. Contact precautions maintained. Patient turned Q2. No pain scoring on NAPs scale. Subq lovenox given for VTE prophylaxis. Falls precautions maintained. Will continue to monitor and maintain safety.    12/29/17 0424   OTHER   Plan of Care Reviewed With patient   Plan of Care Review   Progress improving       Problem: Fall Risk (Adult)  Goal: Identify Related Risk Factors and Signs and Symptoms  Related risk factors and signs and symptoms are identified upon initiation of Human Response Clinical Practice Guideline (CPG).   Outcome: Progressing   12/29/17 0424   Fall Risk (Adult)   Related Risk Factors (Fall Risk) gait/mobility problems;fatigue/slow reaction;impaired vision;environment unfamiliar   Signs and Symptoms (Fall Risk) presence of risk factors     Goal: Absence of Fall  Patient will demonstrate the desired outcomes by discharge/transition of care.   Outcome: Progressing   12/29/17 0424   Fall Risk (Adult)   Absence of Fall making progress toward outcome       Problem: Skin Injury Risk (Adult)  Goal: Identify Related Risk Factors and Signs and Symptoms  Related risk factors and signs and symptoms are identified upon initiation of Human Response Clinical Practice Guideline (CPG).   Outcome: Progressing   12/29/17 0424   Skin Injury Risk (Adult)   Related Risk Factors (Skin Injury Risk) cognitive impairment;critical care admission;mobility impaired     Goal: Skin Health and Integrity  Patient will demonstrate the desired outcomes by discharge/transition of care.   Outcome: Progressing   12/29/17 0424   Skin Injury Risk (Adult)   Skin Health and Integrity making progress toward outcome

## 2017-12-30 LAB — CBC
HEMATOCRIT: 35 % — ABNORMAL LOW (ref 36.0–46.0)
MEAN CORPUSCULAR HEMOGLOBIN: 28.1 pg (ref 26.0–34.0)
MEAN CORPUSCULAR VOLUME: 88.7 fL (ref 80.0–100.0)
MEAN PLATELET VOLUME: 7.6 fL (ref 7.0–10.0)
PLATELET COUNT: 193 10*9/L (ref 150–440)
RED BLOOD CELL COUNT: 3.95 10*12/L — ABNORMAL LOW (ref 4.00–5.20)
RED CELL DISTRIBUTION WIDTH: 14.3 % (ref 12.0–15.0)
WBC ADJUSTED: 6 10*9/L (ref 4.5–11.0)

## 2017-12-30 LAB — COMPREHENSIVE METABOLIC PANEL
ALKALINE PHOSPHATASE: 99 U/L (ref 38–126)
ALT (SGPT): 31 U/L (ref 15–48)
ANION GAP: 11 mmol/L (ref 9–15)
AST (SGOT): 41 U/L — ABNORMAL HIGH (ref 14–38)
BILIRUBIN TOTAL: 1.2 mg/dL (ref 0.0–1.2)
BLOOD UREA NITROGEN: 15 mg/dL (ref 7–21)
BUN / CREAT RATIO: 22
CALCIUM: 8.4 mg/dL — ABNORMAL LOW (ref 8.5–10.2)
CHLORIDE: 104 mmol/L (ref 98–107)
CO2: 22 mmol/L (ref 22.0–30.0)
CREATININE: 0.67 mg/dL (ref 0.60–1.00)
EGFR MDRD AF AMER: >=60 mL/min/{1.73_m2} (ref >=60)
EGFR MDRD NON AF AMER: >=60 mL/min/{1.73_m2} (ref >=60)
GLUCOSE RANDOM: 140 mg/dL (ref 65–179)
POTASSIUM: 3.3 mmol/L — ABNORMAL LOW (ref 3.5–5.0)
PROTEIN TOTAL: 6 g/dL — ABNORMAL LOW (ref 6.5–8.3)
SODIUM: 137 mmol/L (ref 135–145)

## 2017-12-30 LAB — PLATELET COUNT: Lab: 193

## 2017-12-30 LAB — CREATININE: Creatinine:MCnc:Pt:Ser/Plas:Qn:: 0.67

## 2017-12-30 LAB — INR: Lab: 1.43

## 2017-12-30 NOTE — Unmapped (Signed)
Medicine (Med W) Daily Progress Note    Subjective:    Glucose transiently 20 overnight, unclear if true as was asymptomatic and repeats have been normal. Lucid today, understands entire situation and states her sister can help her more at home this time.     Interval Plan: continue lactulose with goal of 4-5BM/d. 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  ??  Hepatic Encephalopathy: ammonia 192 on admission in setting of significant history of medication noncompliance. Sedated upon admission for agitated behavior and is now more alert and oriented.   - bedside abdominal ultrasound revealed no fluid pocket to tap for diag para  - decrease lactulose 40g q4hrs to TID with goal 4-5 BMs/day  - continue rifaximin 550mg  TID  ??  Alcoholic cirrhosis: S/p TIPS 10/10/17. MELD-Na 11 on arrival. Reported no alcohol use since May 2017. Complicated by ascites, encephalopathy, recurrent hepatic hydrothorax (last thora on 3/20 w/ 1100 ml out). No hx of varices. CXR this admission showed improvement in hydrothorax compared to prior.   - continuing lasix 40 daily  - continuing spiro 200 daily  - continuing cipro for SBP ppx  - Consider getting MRI inpatient that was scheduled for outpatient (evaluate lesion in segment 4 noted 06/2017)  ??  Chronic pain: Holding home gabapentin initially   Anxiety/depression: Holding home remeron for now (started at visit on 3/29 for anxiety)    Diet: Regular  DVT Ppx: Lovenox  GI Ppx: Not indicated    Labs/Studies:  Labs and Studies from the last 24hrs per EMR and Reviewed    Objective:  Temp:  [36.4 ??C-36.9 ??C] 36.9 ??C  Heart Rate:  [77-114] 77  SpO2 Pulse:  [90-117] 117  Resp:  [18-24] 18  BP: (111-139)/(60-80) 127/63  SpO2:  [97 %-100 %] 100 %    GEN: Sitting upright, interactive, pleasant  EYES: mild sceral icterus  CV: RRR, no murmurs appreciated  PULM: CTAB   ABD: soft, NT/ND, +BS, negative fluid wave  EXT: No edema, mottled appearing pigmentation along bilateral lower extremities (appears chronic)

## 2017-12-30 NOTE — Unmapped (Signed)
Pt is A&O x 4, on RA. Pt with 7/10 complaint of bilateral leg pain this shift. One time dose of Tramadol given with some relief. MD aware. Pt had a blood sugar of 20 at shift change. Pt received orange juice and one round of D50W with a rechecked blood sugar of 119. Pt noted to be weak when ambulating to the bathroom, RN/NA needed as a stand by assist. Pt remains free from falls: low bed in place, call bell within reach, hourly rounds completed. VSS at this time. Will continue to monitor.       Problem: Patient Care Overview  Goal: Plan of Care Review  Outcome: Progressing  Flowsheets (Taken 12/30/2017 0506)  Progress: improving  Plan of Care Reviewed With: patient;family     Problem: Fall Risk (Adult)  Goal: Identify Related Risk Factors and Signs and Symptoms  Description  Related risk factors and signs and symptoms are identified upon initiation of Human Response Clinical Practice Guideline (CPG).   Outcome: Progressing  Flowsheets (Taken 12/29/2017 0424 by Coral Ceo, RN)  Related Risk Factors (Fall Risk): gait/mobility problems;fatigue/slow reaction;impaired vision;environment unfamiliar  Signs and Symptoms (Fall Risk): presence of risk factors  Goal: Absence of Fall  Description  Patient will demonstrate the desired outcomes by discharge/transition of care.   Outcome: Progressing  Flowsheets (Taken 12/30/2017 0506)  Absence of Fall: making progress toward outcome     Problem: Skin Injury Risk (Adult)  Goal: Identify Related Risk Factors and Signs and Symptoms  Description  Related risk factors and signs and symptoms are identified upon initiation of Human Response Clinical Practice Guideline (CPG).   Outcome: Progressing  Flowsheets (Taken 12/29/2017 0424 by Coral Ceo, RN)  Related Risk Factors (Skin Injury Risk): cognitive impairment;critical care admission;mobility impaired  Goal: Skin Health and Integrity  Description  Patient will demonstrate the desired outcomes by discharge/transition of care. Outcome: Progressing  Flowsheets (Taken 12/30/2017 0506)  Skin Health and Integrity: making progress toward outcome     Problem: VTE, DVT and PE (Adult)  Goal: Signs and Symptoms of Listed Potential Problems Will be Absent, Minimized or Managed (VTE, DVT and PE)  Description  Signs and symptoms of listed potential problems will be absent, minimized or managed by discharge/transition of care (reference VTE, DVT and PE (Adult) CPG).   Outcome: Progressing  Flowsheets (Taken 12/29/2017 1641 by Cherylann Banas, RN)  Problems Assessed (VTE, DVT, PE): all  Problems Present (VTE, DVT, PE): none     Problem: Confusion, Acute (Adult)  Goal: Identify Related Risk Factors and Signs and Symptoms  Description  Related risk factors and signs and symptoms are identified upon initiation of Human Response Clinical Practice Guideline (CPG).   Outcome: Progressing  Flowsheets (Taken 12/30/2017 0506)  Related Risk Factors (Acute Confusion): pain;polypharmacy;sleep disturbance  Signs and Symptoms (Acute Confusion): emotional/behavioral disturbances  Goal: Cognitive/Functional Impairments Minimized  Description  Patient will demonstrate the desired outcomes by discharge/transition of care.   Outcome: Progressing  Flowsheets (Taken 12/30/2017 0506)  Cognitive/Functional Impairments Minimized: making progress toward outcome     Problem: Neurologic Function Impaired (Liver Failure)  Goal: Optimal Neurologic Function  Outcome: Progressing  Intervention: Protect and Optimize Cognitive Function  Flowsheets (Taken 12/30/2017 0506)  Hepatic Encephalopathy Management: sedation use minimized  Seizure Precautions: activity supervised;clutter-free environment maintained     Problem: Oral Intake Inadequate (Liver Failure)  Goal: Optimal Nutrition Intake  Outcome: Progressing  Intervention: Promote and Optimize Nutrition  Flowsheets (Taken 12/30/2017 0506)  Oral Nutrition Promotion: social  interaction promoted  Environmental Support: calm environment promoted;distractions minimized     Problem: Pain (Liver Failure)  Goal: Acceptable Pain Control  Outcome: Progressing  Intervention: Monitor and Manage Pain  Flowsheets (Taken 12/30/2017 0506)  Pain Management Interventions: pain management plan reviewed with patient/caregiver     Problem: Pain Acute  Goal: Optimal Pain Control  Outcome: Progressing  Intervention: Develop Pain Management Plan  Flowsheets (Taken 12/30/2017 0506)  Pain Management Interventions: pain management plan reviewed with patient/caregiver

## 2017-12-30 NOTE — Unmapped (Signed)
Adult Nutrition Consult     Visit Type: RN Consult via Interdisciplinary Screening and Assessment Form. Identifiers: Loss of body weight without trying and Decreased appetite over the last month  Reason for Visit:  Assessment    ASSESSMENT:   HPI & PMH: Darlene Thompson is a 52 year old woman with HTN, anxiety/depression, gastric bypass '09, alcoholic cirrhosis c/b hepatic encephalopathy and hepatic hydrothorax s/p TIPS 10/10/17 who presented with encephalopathy.    Nutrition Hx: Pt reports good appetite/intake however chart suggests pt eating 0% of meals on current admission.  She reports eating a good breakfast today and has some items at bedside to [facility] on.  She denies chew/swallow concerns or constipation/diarrhea however reports baseline nausea that she has had since 2008.  She reports her wt has fluctuated based on fluid status however does not know her dry wt and believes it has been trending down.      Nutritionally Pertinent Meds: Cipro, Folic Acid, Lasix, Aldactone    Labs: K - 3.3, Ca - 8.4     Abd/GI: Last BM on 4/5     Skin:   Patient Lines/Drains/Airways Status    Active Wounds     Name:   Placement date:   Placement time:   Site:   Days:    Wound 10/19/16 right anterior leg   10/19/16    1406    ???   437    Wound 10/19/16 proximal medial right leg   10/19/16    1409    ???   437    Wound 10/19/16 Distal medial right leg   10/19/16    1409    ???   437    Wound 03/20/17 Leg Left;Posterior   03/20/17    1336    Leg   285    Surgical Site 07/11/16 Back   07/11/16    2000     536    Surgical Site 10/24/16 Leg Right   10/24/16    1233     432               Current nutrition therapy order:   Nutrition Orders          Nutrition Therapy General (Regular) starting at 04/05 1428           Anthropometric Data:  -- Height: 165.1 cm (5' 5)   -- Last recorded weight: 75.3 kg (166 lb)  -- Admission weight: 75.3 kg  -- IBW: 56.75 kg  -- Percent IBW: 133%  -- BMI: Body mass index is 27.62 kg/m??.   -- Weight changes this admission:   Last 5 Recorded Weights    12/28/17 1415 12/30/17 0651   Weight: 77 kg (169 lb 12.1 oz) 75.3 kg (166 lb)      -- Weight history PTA: Wt hx suggests wt fluctuates with fluid status.  Difficult to assess overall trends due to fluid status.   Wt Readings from Last 10 Encounters:   12/30/17 75.3 kg (166 lb)   12/22/17 82.8 kg (182 lb 9.6 oz)   12/13/17 88.3 kg (194 lb 10.7 oz)   10/20/17 75.1 kg (165 lb 9.6 oz)   10/11/17 75.2 kg (165 lb 12.8 oz)   09/15/17 74 kg (163 lb 2.3 oz)   07/24/17 74 kg (163 lb 1.6 oz)   07/06/17 78.9 kg (174 lb)   06/06/17 77.6 kg (171 lb)   05/25/17 79.7 kg (175 lb 9.6 oz)  Daily Estimated Nutrient Needs:   Energy: 1610-9604 kcals [Per [place] [employer]-Jeor Equation using admission body weight, 75.3 kg (12/30/17 1517)]  Protein: 68-85 gm [1.2-1.5 gm/kg using ideal body weight, 56.75 kg (12/30/17 1517)]  Carbohydrate:   [no restriction]  Fluid:   [per MD team]     Nutrition Focused Physical Exam:  Fat Areas Examined  Orbital: Moderate loss  Upper Arm: Moderate loss      Muscle Areas Examined  Temple: Moderate loss  Clavicle: Moderate loss  Acromion: Moderate loss  Dorsal Hand: Mild loss  Anterior Thigh: Mild loss  Posterior Calf: Mild loss         Nutrition Evaluation  Overall Impressions: Moderate muscle loss;Moderate fat loss (12/30/17 1518)     DIAGNOSIS:  Malnutrition Assessment using AND/ASPEN Clinical Characteristics:    Non-severe (Moderate) Protein-Calorie Malnutrition in the context of chronic illness (12/30/17 1519)        Fat Loss: Moderate  Muscle Loss: Moderate              Overall nutrition impression: Pt with moderate mm/fat losses on physical exam.  She reports her appetite/intake fluctuates with fluid status.  She reports a decline PTA however states she is eating better at this time.  She denies need for oral supplements/snacks as she feels she is currently eating enough.  Difficult to assess as wt trending down with diureses and po records indicate 0% po intake on current admission.  She would benefit from initiation of a calorie count to better estimate po intake.        GOALS:  Oral Intake:       - Patient to consume 75% of 3 meals per day.  Laboratory Data:       - Electrolyte and renal profile will trend towards normal limits.  - Blood Glucose and/or A1C values trend towards normal limits.     RECOMMENDATIONS AND INTERVENTIONS:  Recommend continue current nutrition therapy   Consider a calorie count to better assess po adequacy  Replete lytes PRN  Please weigh pt weekly    RD Follow Up Parameters:  1-2 times per week (and more frequent as indicated)     Linde Gillis, RD, LDN  [phone]

## 2017-12-31 LAB — COMPREHENSIVE METABOLIC PANEL
ALBUMIN: 3.1 g/dL — ABNORMAL LOW (ref 3.5–5.0)
ALKALINE PHOSPHATASE: 125 U/L (ref 38–126)
ALT (SGPT): 29 U/L (ref 15–48)
ANION GAP: 8 mmol/L — ABNORMAL LOW (ref 9–15)
AST (SGOT): 51 U/L — ABNORMAL HIGH (ref 14–38)
BLOOD UREA NITROGEN: 17 mg/dL (ref 7–21)
BUN / CREAT RATIO: 27
CALCIUM: 8.9 mg/dL (ref 8.5–10.2)
CHLORIDE: 103 mmol/L (ref 98–107)
CO2: 25 mmol/L (ref 22.0–30.0)
CREATININE: 0.63 mg/dL (ref 0.60–1.00)
EGFR MDRD AF AMER: >=60 mL/min/{1.73_m2} (ref >=60)
EGFR MDRD NON AF AMER: >=60 mL/min/{1.73_m2} (ref >=60)
GLUCOSE RANDOM: 113 mg/dL (ref 65–179)
POTASSIUM: 4.4 mmol/L (ref 3.5–5.0)
PROTEIN TOTAL: 7.1 g/dL (ref 6.5–8.3)
SODIUM: 136 mmol/L (ref 135–145)

## 2017-12-31 LAB — CBC
HEMATOCRIT: 38.1 % (ref 36.0–46.0)
HEMOGLOBIN: 12.2 g/dL (ref 12.0–16.0)
MEAN CORPUSCULAR HEMOGLOBIN CONC: 31.9 g/dL (ref 31.0–37.0)
MEAN CORPUSCULAR HEMOGLOBIN: 28.4 pg (ref 26.0–34.0)
MEAN CORPUSCULAR VOLUME: 88.9 fL (ref 80.0–100.0)
MEAN PLATELET VOLUME: 7.8 fL (ref 7.0–10.0)
PLATELET COUNT: 266 10*9/L (ref 150–440)
RED BLOOD CELL COUNT: 4.29 10*12/L (ref 4.00–5.20)
WBC ADJUSTED: 6.3 10*9/L (ref 4.5–11.0)

## 2017-12-31 LAB — MEAN CORPUSCULAR HEMOGLOBIN: Lab: 28.4

## 2017-12-31 LAB — MAGNESIUM
MAGNESIUM: 2.1 mg/dL (ref 1.6–2.2)
Magnesium:MCnc:Pt:Ser/Plas:Qn:: 2.1

## 2017-12-31 LAB — EGFR MDRD AF AMER: Glomerular filtration rate/1.73 sq M.predicted.black:ArVRat:Pt:Ser/Plas/Bld:Qn:Creatinine-based formula (MDRD): >=60

## 2017-12-31 LAB — PROTIME: Lab: 14.8 — ABNORMAL HIGH

## 2017-12-31 NOTE — Unmapped (Signed)
Pt is A&O x 4, on RA. Pt with 8/10 complaint of generalized body and rib cage pain this shift. One time dose of Tramadol given with adequate relief. Voltaren gel applied to legs and to right side. Pt also expressed some c/o restlessness. Pt given Melatonin for sleep. Pt resting well thus far this shift. Pt given PRN Lactulose earlier this shift, with 2 BMs. Pt on lovenox for VTE. Pt remains free from falls: low bed in place, call bell within reach, hourly rounds completed. VSS at this time. Will continue to monitor.       Problem: Patient Care Overview  Goal: Plan of Care Review  Outcome: Progressing  Flowsheets  Taken 12/30/2017 2015 by Letta Moynahan, RN  Plan of Care Reviewed With: patient  Taken 12/31/2017 0326 by Kennyth Arnold, RN  Progress: improving  Goal: Individualization and Mutuality  Outcome: Progressing  Flowsheets  Taken 12/30/2017 2015 by Letta Moynahan, RN  Patient-Specific Goals (Include Timeframe): pt will remain free of falls throughout shift  Patient-Specific Preferences: bed locked and lowest position, uses call bell appropriately.  Taken 12/31/2017 0326 by Kennyth Arnold, RN  Patient Specific Preferences: pt prefers for door to remain closed, knock and wait for response before entering  Goal: Discharge Needs Assessment  Outcome: Progressing  Goal: Interprofessional Rounds/Family Conf  Outcome: Progressing     Problem: Fall Risk (Adult)  Goal: Identify Related Risk Factors and Signs and Symptoms  Description  Related risk factors and signs and symptoms are identified upon initiation of Human Response Clinical Practice Guideline (CPG).   Outcome: Progressing  Flowsheets  Taken 12/31/2017 0326 by Kennyth Arnold, RN  Related Risk Factors (Fall Risk): fatigue/slow reaction;impaired vision;slippery/uneven surfaces  Taken 12/29/2017 0424 by Coral Ceo, RN  Signs and Symptoms (Fall Risk): presence of risk factors  Goal: Absence of Fall  Description  Patient will demonstrate the desired outcomes by discharge/transition of care.   Outcome: Progressing     Problem: Skin Injury Risk (Adult)  Goal: Identify Related Risk Factors and Signs and Symptoms  Description  Related risk factors and signs and symptoms are identified upon initiation of Human Response Clinical Practice Guideline (CPG).   Outcome: Progressing  Flowsheets (Taken 12/31/2017 0326)  Related Risk Factors (Skin Injury Risk): critical care admission;edema  Goal: Skin Health and Integrity  Description  Patient will demonstrate the desired outcomes by discharge/transition of care.   Outcome: Progressing  Flowsheets (Taken 12/31/2017 0326)  Skin Health and Integrity: making progress toward outcome     Problem: VTE, DVT and PE (Adult)  Goal: Signs and Symptoms of Listed Potential Problems Will be Absent, Minimized or Managed (VTE, DVT and PE)  Description  Signs and symptoms of listed potential problems will be absent, minimized or managed by discharge/transition of care (reference VTE, DVT and PE (Adult) CPG).   Outcome: Progressing  Flowsheets (Taken 12/29/2017 1641 by Cherylann Banas, RN)  Problems Assessed (VTE, DVT, PE): all  Problems Present (VTE, DVT, PE): none     Problem: Confusion, Acute (Adult)  Goal: Identify Related Risk Factors and Signs and Symptoms  Description  Related risk factors and signs and symptoms are identified upon initiation of Human Response Clinical Practice Guideline (CPG).   Outcome: Progressing  Goal: Cognitive/Functional Impairments Minimized  Description  Patient will demonstrate the desired outcomes by discharge/transition of care.   Outcome: Progressing  Goal: Safety  Description  Patient will demonstrate the desired outcomes by discharge/transition of care.   Outcome:  Progressing     Problem: Neurologic Function Impaired (Liver Failure)  Goal: Optimal Neurologic Function  Outcome: Progressing     Problem: Oral Intake Inadequate (Liver Failure)  Goal: Optimal Nutrition Intake  Outcome: Progressing  Intervention: Promote and Optimize Nutrition  Flowsheets (Taken 12/30/2017 0506)  Oral Nutrition Promotion: social interaction promoted  Environmental Support: calm environment promoted;distractions minimized     Problem: Pain (Liver Failure)  Goal: Acceptable Pain Control  Outcome: Progressing     Problem: Pain Acute  Goal: Optimal Pain Control  Outcome: Progressing  Intervention: Develop Pain Management Plan  Flowsheets (Taken 12/30/2017 0506)  Pain Management Interventions: pain management plan reviewed with patient/caregiver  Intervention: Prevent or Manage Pain  Flowsheets (Taken 12/31/2017 0326)  Sleep/Rest Enhancement: awakenings minimized     Problem: Hypertension Comorbidity  Goal: Blood Pressure in Desired Range  Outcome: Progressing  Intervention: Maintain Hypertension-Management Strategies  Flowsheets (Taken 12/31/2017 0326)  Syncope Management: legs elevated;position changed slowly  Medication Review/Management: medications reviewed

## 2017-12-31 NOTE — Unmapped (Signed)
Physician Discharge Summary    Identifying Information:   Darlene Thompson  April 25, 1966  [id number]    Admit date: 12/27/2017    Discharge date: 12/31/2017     Discharge Service: Med General [facility] (MDW)    Discharge Attending Physician: Blanchie Serve, MD    Discharge to: Home with Home Health and/or PT/OT    Discharge Diagnoses:  Principal Problem:    Hepatic encephalopathy (CMS-HCC)  Active Problems:    Hydrothorax    Alcoholic cirrhosis of liver with ascites (CMS-HCC)    S/P TIPS (transjugular intrahepatic portosystemic shunt)    Anxiety  Resolved Problems:    * No resolved hospital problems. [facility] follow-up:   [ ]  address medication compliance with lactulose and rifaximin  [ ]  needs MRI outpatient to evaluate lesion in segment 4 noted 06/2017    Hospital Course:   Darlene Thompson is a 52 year old woman with HTN, anxiety/depression, gastric bypass '09, alcoholic cirrhosis c/b hepatic encephalopathy and hepatic hydrothorax s/p TIPS 10/10/17 who presented with hepatic encephalopathy.  ??  Hepatic Encephalopathy: ammonia 192 on admission (25 one week prior on 3/29) in setting of significant history of medication noncompliance. Agitated behavior on presentation and was given versed and haldol in ED subsequently was sedated and unable to tolerate PO so NG tube was placed for lactulose with goal of 4-5 BMs/day and Rifaximin. On 4/5, Darlene Thompson was more alert and fully oriented so NG tube was removed and she was transitioned to PO meds. Darlene Thompson continued to improve and was educated on medication compliance prior to discharge.   ??  Alcoholic cirrhosis: From prior notes, no alcohol use since May 2017. History of decompensation with ascites and encephalopathy with recurrent hepatic hydrothorax w/ last thora on 3/20 w/ 1100 ml out. CXR this admission showed improvement in hydrothorax compared to prior. S/p TIPS 10/10/17. MELD-Na 11 on arrival. Darlene Thompson was continued on lasix 40 daily and spiro 200 daily as well as cipro for SBP ppx.   ??  Chronic pain: Home gabapentin was initially held due to AMS but restarted prior to discharge.  Anxiety/depression: Held home remeron but was restarted once Darlene Thompson was more alert     Procedures:  None  No admission procedures for hospital encounter.  _____________________________________________________________________________  Discharge Day Services:  BP (P) 114/73  - Pulse (P) 84  - Temp (P) 36.8 ??C (Oral)  - Resp (P) 17  - Ht 165.1 cm (5' 5)  - Wt 76.7 kg (169 lb 1.6 oz)  - LMP 04/23/2016 (Approximate)  - SpO2 (P) 100%  - BMI 28.14 kg/m??   Pt seen on the day of discharge and determined appropriate for discharge.    Condition at Discharge: stable    Length of Discharge: I spent greater than 30 mins in the discharge of this Darlene Thompson.  _____________________________________________________________________________  Discharge Medications:     Your Medication List      CONTINUE taking these medications    ciprofloxacin HCl 500 MG tablet  Commonly known as:  CIPRO  Take 1 tablet (500 mg total) by mouth daily.     folic acid 1 MG tablet  Commonly known as:  FOLVITE  Take 1 tablet (1 mg total) by mouth daily.     furosemide 40 MG tablet  Commonly known as:  LASIX  Take 1 tablet (40 mg total) by mouth daily.     gabapentin 100 MG capsule  Commonly known as:  NEURONTIN  Take 1 capsule (100  mg total) by mouth Three (3) times a day.     lactulose 10 gram/15 mL solution  Commonly known as:  CHRONULAC  Take 30 mL (20 g total) by mouth Three (3) times a day.     mirtazapine 7.5 MG tablet  Commonly known as:  REMERON  Take 1 tablet (7.5 mg total) by mouth nightly.     rifAXIMin 550 mg Tab  Commonly known as:  Xifaxan  Take 1 tablet (550 mg total) by mouth Two (2) times a day.     spironolactone 100 MG tablet  Commonly known as:  ALDACTONE  Take 2 tablets (200 mg total) by mouth daily.          _____________________________________________________________________________  Pending Test Results (if blank, then none): Order Current Status    Magnesium Level In process          Most Recent Labs:  Microbiology Results (last day)     ** No results found for the last 24 hours. **          Lab Results   Component Value Date    WBC 6.3 12/31/2017    HGB 12.2 12/31/2017    HCT 38.1 12/31/2017    PLT 266 12/31/2017       Lab Results   Component Value Date    NA 136 12/31/2017    K 4.4 12/31/2017    CL 103 12/31/2017    CO2 25.0 12/31/2017    BUN 17 12/31/2017    CREATININE 0.63 12/31/2017    CALCIUM 8.9 12/31/2017    MG 1.9 12/28/2017    PHOS 3.7 12/28/2017       Lab Results   Component Value Date    ALKPHOS 125 12/31/2017    BILITOT 1.0 12/31/2017    BILIDIR 0.80 (H) 01/15/2017    PROT 7.1 12/31/2017    ALBUMIN 3.1 (L) 12/31/2017    ALT 29 12/31/2017    AST 51 (H) 12/31/2017    GGT 164 (H) 10/20/2017       Lab Results   Component Value Date    PT 14.8 (H) 12/31/2017    INR 1.30 12/31/2017    APTT 37.8 (H) 12/11/2017     Hospital Radiology:  Xr Chest Portable    Result Date: 12/28/2017  EXAM: XR CHEST PORTABLE DATE: 12/27/2017 11:42 PM ACCESSION: [id number] UN DICTATED: 12/27/2017 11:55 PM INTERPRETATION LOCATION: [facility] CLINICAL INDICATION: 52 years old Female with ALTERED MENTAL STATUS--  COMPARISON: 12/14/2017 TECHNIQUE: Portable Chest Radiograph. FINDINGS: Slightly hypoinflated lungs. Right basilar opacities, likely atelectasis secondary to a moderate-sized right pleural effusion. No pneumothorax. No left pleural effusion. Cardiomediastinal silhouette is unchanged. No acute osseous abnormalities.     Moderate size right pleural effusion is slightly decreased compared to prior study. There is associated passive atelectasis at the right lung base.    Ct Head Wo Contrast    Result Date: 12/28/2017  EXAM: Computed tomography, head or brain without contrast material. DATE: 12/28/2017 12:44 AM ACCESSION: [id number] UN DICTATED: 12/28/2017 1:24 AM INTERPRETATION LOCATION: [facility] CLINICAL INDICATION: 52 years old Female with ALTERED MENTAL STATUS--  COMPARISON: 12/11/2017 TECHNIQUE: Axial CT images of the head  from skull base to vertex without contrast. FINDINGS: There is no midline shift or mass lesion.  There is no evidence of intracranial hemorrhage or acute infarct.  No fractures are evident. There is subtotal opacification of the maxillary sinuses with air-fluid levels and a soap bubble appearance. Mild mucosal thickening in posterior left ethmoid air  cells. The sinuses are otherwise pneumatized and free of fluid. Mastoid air cells are clear.     No acute intracranial abnormalities. Acute on chronic maxillary sinus disease.    Xr Chest 2 Views    Result Date: 12/29/2017  EXAM: XR CHEST 2 VIEWS DATE: 12/29/2017 ACCESSION: [id number] UN DICTATED: 12/29/2017 3:34 PM INTERPRETATION LOCATION: [facility] CLINICAL INDICATION: 52 years old Female with PNEUMONIA--  COMPARISON: Chest radiograph 12/27/2017 TECHNIQUE: PA and Lateral Chest Radiographs. FINDINGS: Large right pleural effusion with few patchy right basilar opacities. Left lung is clear. No pneumothorax. No left pleural effusion. Cardiomediastinal silhouette is partially obscured, but within normal limits.     Large right pleural effusion with adjacent right lower lobe atelectasis.    Xr Abdomen 1 View    Result Date: 12/28/2017  EXAM: XR ABDOMEN 1 VIEW DATE: 12/28/2017 7:13 PM ACCESSION: [id number] UN DICTATED: 12/28/2017 7:14 PM INTERPRETATION LOCATION: [facility] CLINICAL INDICATION: 52 years old Female with NGT (CATHETER NON VASC FIT & ADJ)- reinserted-  TECHNIQUE: Supine view of the abdomen was obtained.  COMPARISON: 12/28/2017 at 4:20 PM CONCLUSIONS: Enteric tube remains coiled at the level of the GE junction with the distal tip over the distal stomach, unchanged. Gaseous distention of large bowel with moderate colonic stool burden, similar in appearance. Visualized lung bases unchanged.     Xr Abdomen 1 View    Result Date: 12/28/2017  EXAM: XR ABDOMEN 1 VIEW DATE: 12/28/2017 4:25 PM ACCESSION: [id number] UN DICTATED: 12/28/2017 4:28 PM INTERPRETATION LOCATION: [facility] CLINICAL INDICATION: 52 years old Female with NGT (CATHETER NON VASC FIT & ADJ)--  COMPARISON: CT abdomen radiographs on 12/28/2017. TECHNIQUE: Supine view of the abdomen. FINDINGS: Partially imaged nonweighted enteric tube with sidehole and tip overlying the stomach. This enteric tube is partially coiled at the level of GE junction. Partially imaged mild gaseous dilatation of the large bowel. Moderate to large colonic stool burden. No abnormal soft tissue masses or calcifications noted. No acute osseous abnormality. Moderate sized right pleural effusion.     Nonweighted coiled enteric tube with sidehole and tip overlying the stomach. Mild gaseous dilatation of large bowel loops.    Xr Abdomen 1 View    Result Date: 12/28/2017  EXAM: XR ABDOMEN 1 VIEW DATE: 12/28/2017 5:53 AM ACCESSION: [id number] UN DICTATED: 12/28/2017 6:26 AM INTERPRETATION LOCATION: [facility] CLINICAL INDICATION: 52 years old Female with NGT (CATHETER NON VASC FIT & ADJ)--  COMPARISON: 12/12/2017. TECHNIQUE: Supine view of the abdomen. FINDINGS: Nasogastric tube with tip at the distal esophagus. Nonobstructive upper abdominal bowel gas pattern. TIPS stent in the right upper quadrant. Unchanged right pleural effusion.     - Enteric tube with tip overlying the distal esophagus. Recommend advancement by approximately 9 cm.       _____________________________________________________________________________  Discharge Instructions:               Follow Up instructions and Outpatient Referrals     Ambulatory referral to Home Health      Disciplines requested:  Nursing    Nursing requested:  Teaching/skilled observation and assessment    What teaching is needed (new diagnosis? new medications?):  medication compliance for cirrhosis (lactulose and rifaximin)    Physician to follow Darlene Thompson's care:  PCP    I certify that Darlene Thompson is confined to his/her home and needs intermittent skilled nursing care, physical therapy and/or speech therapy or continues to need occupational therapy. The Darlene Thompson is under my care, and I have authorized services on this plan of care and  will periodically review the plan. The Darlene Thompson had a face-to-face encounter with an allowed provider type on 12/31/2017 and the encounter was related to the primary reason for home health care.     Medication reconciliation and education         Discharge instructions      Darlene Thompson, you were admitted due to hepatic encephalopathy which was causing you to be agitated and altered. You were treated with lactulose and rifaximin with good improvement in your mental status. We think that this happened because you weren't taking your lactulose and rifaximin at home. We recommend taking this regularly and being sure to follow up with your hepatologist and your primary care doctor.               Appointments which have been scheduled for you    Feb 02, 2018 10:20 AM EDT  (Arrive by 9:50 AM)  RETURN ALCOHOL PSYCHOLSOCIAL with Launa Flight, ANP  [facility]) [address]  [place] [address]  [phone]

## 2017-12-31 NOTE — Unmapped (Signed)
Pt A/Ox4. VSS. Afebrile. Slurred and delayed responses but answers appropraitely. Pt bladder scans remained with low urine retention. Pt urinated regularly in bathroom. Able to ambulate independently. BG remained stable throughout shift. Potassium decreased to 3.3 and MD notified. Potassium PO ordered and given. Lasix held until Potassium given. No c/o pain or nausea throughout shift. Visitors at bedside. Will continue to monitor.     Problem: Patient Care Overview  Goal: Plan of Care Review  Outcome: Progressing  Flowsheets (Taken 12/30/2017 2015)  Progress: improving  Plan of Care Reviewed With: patient  Goal: Individualization and Mutuality  Outcome: Progressing  Flowsheets (Taken 12/30/2017 2015)  Patient-Specific Goals (Include Timeframe): pt will remain free of falls throughout shift  Patient-Specific Preferences: bed locked and lowest position, uses call bell appropriately.  Patient Specific Preferences: pt prefers non meat based proteins  Goal: Discharge Needs Assessment  Outcome: Progressing  Goal: Interprofessional Rounds/Family Conf  Outcome: Progressing     Problem: Fall Risk (Adult)  Goal: Identify Related Risk Factors and Signs and Symptoms  Description  Related risk factors and signs and symptoms are identified upon initiation of Human Response Clinical Practice Guideline (CPG).   Outcome: Progressing  Flowsheets (Taken 12/29/2017 0424 by Coral Ceo, RN)  Related Risk Factors (Fall Risk): gait/mobility problems;fatigue/slow reaction;impaired vision;environment unfamiliar  Signs and Symptoms (Fall Risk): presence of risk factors  Goal: Absence of Fall  Description  Patient will demonstrate the desired outcomes by discharge/transition of care.   Outcome: Progressing     Problem: Skin Injury Risk (Adult)  Goal: Identify Related Risk Factors and Signs and Symptoms  Description  Related risk factors and signs and symptoms are identified upon initiation of Human Response Clinical Practice Guideline (CPG). Outcome: Progressing  Flowsheets (Taken 12/29/2017 0424 by Coral Ceo, RN)  Related Risk Factors (Skin Injury Risk): cognitive impairment;critical care admission;mobility impaired  Goal: Skin Health and Integrity  Description  Patient will demonstrate the desired outcomes by discharge/transition of care.   Outcome: Progressing     Problem: VTE, DVT and PE (Adult)  Goal: Signs and Symptoms of Listed Potential Problems Will be Absent, Minimized or Managed (VTE, DVT and PE)  Description  Signs and symptoms of listed potential problems will be absent, minimized or managed by discharge/transition of care (reference VTE, DVT and PE (Adult) CPG).   Outcome: Progressing  Flowsheets (Taken 12/29/2017 1641 by Cherylann Banas, RN)  Problems Assessed (VTE, DVT, PE): all  Problems Present (VTE, DVT, PE): none     Problem: Confusion, Acute (Adult)  Goal: Identify Related Risk Factors and Signs and Symptoms  Description  Related risk factors and signs and symptoms are identified upon initiation of Human Response Clinical Practice Guideline (CPG).   Outcome: Progressing  Goal: Cognitive/Functional Impairments Minimized  Description  Patient will demonstrate the desired outcomes by discharge/transition of care.   Outcome: Progressing  Goal: Safety  Description  Patient will demonstrate the desired outcomes by discharge/transition of care.   Outcome: Progressing     Problem: Neurologic Function Impaired (Liver Failure)  Goal: Optimal Neurologic Function  Outcome: Progressing     Problem: Oral Intake Inadequate (Liver Failure)  Goal: Optimal Nutrition Intake  Outcome: Progressing     Problem: Pain (Liver Failure)  Goal: Acceptable Pain Control  Outcome: Progressing     Problem: Pain Acute  Goal: Optimal Pain Control  Outcome: Progressing     Problem: Hypertension Comorbidity  Goal: Blood Pressure in Desired Range  Outcome: Progressing

## 2018-01-03 MED ORDER — LACTULOSE 20 GRAM ORAL PACKET
PACK | Freq: Three times a day (TID) | ORAL | 11 refills | 0.00000 days | Status: CP
Start: 2018-01-03 — End: 2018-02-15

## 2018-01-04 ENCOUNTER — Telehealth: Payer: Self-pay | Admitting: Family Medicine

## 2018-01-04 NOTE — Telephone Encounter (Signed)
Copied from CRM [id number]. Topic: General - Other >> Jan 04, 2018  5:07 PM Debroah LoopLander, Lumin L wrote: Reason for CRM: Pauline GoodBea Murphy, RN with [facility], calling to report that the patient was admitted today for home care and the biggest concern is medication management. There was no Cipro in her home. She has a glucometer in the home as well and has been checking her blood sugar. Bea would like a call back from Dr. Birdie SonsSonnenberg or cma.

## 2018-01-05 NOTE — Telephone Encounter (Signed)
Spoke with nurse, she is going to fax over a medication list for us to update. A verbal order was also given for social work. Pt was recently in the hospital.

## 2018-01-05 NOTE — Telephone Encounter (Signed)
Will review medication list once we receive this and update as appropriate.

## 2018-01-05 NOTE — Telephone Encounter (Signed)
Please advise 

## 2018-01-08 NOTE — Telephone Encounter (Signed)
I called RN this morning & notified her that we never received that fax. She is going to refax to the pod this morning.

## 2018-01-08 NOTE — Telephone Encounter (Signed)
Papers are on your desk.

## 2018-01-08 NOTE — Telephone Encounter (Signed)
Reviewed.  Based on [facility] it appears that her gabapentin dose is incorrect and that she may no longer be on Remeron.  I will say that this is not a completely updated medication list given that the system did not allow access to her most recent records from them.  I would suggest that home health contacts [facility] to get an updated list of medications given that these all appear to [facility].

## 2018-01-09 MED ORDER — CIPROFLOXACIN 500 MG TABLET
ORAL_TABLET | Freq: Every day | ORAL | 0 refills | 0.00000 days | Status: CP
Start: 2018-01-09 — End: 2018-02-15

## 2018-01-09 NOTE — Unmapped (Signed)
Patient called for refill of cipro.

## 2018-01-09 NOTE — Unmapped (Signed)
Reason for call: Notify patient [facility] has been trying to reach her to coordinate Xifaxan refill    [facility] has made multiple attempts to reach patient to coordinate Xifaxan refill. Called and spoke with patient at phone number [phone]. Patient stated she had been admitted for hepatic encephalopathy. Patient stated she was not taking her lactulose as often has she should of. Patient feels she has a better understanding of her disease and medication schedule and is now taking her lactulose 3 x daily with cranberry juice. Patient confirmed she is taking her Xifaxan BID. Confirmed patient's address on Epic. Patient provided the phone number to [facility] [phone] option # 4 and encouraged to call after we hang up to schedule her Xifaxan refill. Patient verbalized understanding.       Vertell Limber RN, BSN  Nursing Care Coordinator   Pharmacy Adult GI Medicine  [facility]  [address]   [place], [place] [address]  [phone]

## 2018-01-09 NOTE — Unmapped (Signed)
[facility] Special[facility] Pharmacy Refill Coordination Note  Specialty Medication(s): Xifaxan 550mg     Darlene Thompson, DOB: [birth year]  Phone: [phone] (home) , Alternate phone contact: N/A  Phone or address changes today?: No  All above HIPAA information was verified with patient.  Shipping Address: [address]  [place] [address]   Insurance changes? No    Completed refill call assessment today to schedule patient's medication shipment from the [facility] [phone]).      Confirmed the medication and dosage are correct and have not changed: Yes, regimen is correct and unchanged.    Confirmed patient started or stopped the following medications in the past month:  No, there are no changes reported at this time.    Are you tolerating your medication?:  Darlene Thompson reports tolerating the medication.    ADHERENCE  Did you miss any doses in the past 4 weeks? Patient declines to answer.    FINANCIAL/SHIPPING    Delivery Scheduled: Yes, Expected medication delivery date: 01/11/2018     The patient will receive an FSI print out for each medication shipped and additional FDA Medication Guides as required.  Patient education from Darlene Thompson or Robet Leu may also be included in the shipment    Lavora did not have any additional questions at this time.    Delivery address validated in FSI scheduling system: Yes, address listed in FSI is correct.    We will follow up with patient monthly for standard refill processing and delivery.      Thank you,  Manny Vitolo  Anders Grant   [facility] Specialty Pharmacist

## 2018-01-10 MED FILL — XIFAXAN/550MG/TABS: XIFAXAN/550MG/TABS | 30 days supply | Qty: 60 | Fill #2

## 2018-01-10 NOTE — Telephone Encounter (Unsigned)
Copied from CRM [id number]. Topic: General - Other >> Jan 10, 2018  2:25 PM Arlyss Gandyichardson, Taren N, NT wrote: Reason for CRM: Pt states that someone called her from the office but her voicemail is not set up right and she cannot receive voicemail's. Please advise.

## 2018-01-11 ENCOUNTER — Ambulatory Visit: Admit: 2018-01-11 | Discharge: 2018-01-12 | Payer: MEDICARE

## 2018-01-11 DIAGNOSIS — K7030 Alcoholic cirrhosis of liver without ascites: Principal | ICD-10-CM

## 2018-01-11 NOTE — Telephone Encounter (Signed)
Patient notified of Dr. Kermit BaloSonnenbergs recommendations. Patient verbalized understanding and states that [facility] will [facility] and ask for an updated med list.

## 2018-01-20 NOTE — Unmapped (Signed)
Patient called and left a message requesting medication prescribed for leg cramps. She stated that she was recently hospitalized and received magnesium for leg cramps. She is requesting the same thing.  Will forward message to providers.

## 2018-01-25 ENCOUNTER — Telehealth: Payer: Self-pay | Admitting: Family Medicine

## 2018-01-25 ENCOUNTER — Inpatient Hospital Stay: Payer: Medicaid Other | Admitting: Family Medicine

## 2018-01-25 NOTE — Telephone Encounter (Signed)
Please advise 

## 2018-01-25 NOTE — Telephone Encounter (Signed)
Noted. This will need to be confirmed in the office with a lab draw to determine if her machine is accurate. She needs to have adequate oral intake. She missed her visit today. She needs to reschedule this. Please also make sure we mail her a letter regarding her no shows as she has had a number of them.

## 2018-01-25 NOTE — Telephone Encounter (Signed)
Patient is scheduled for follow up 02/13/18. Do you need her to have a lab appointment to do the glucose test?  Letter has been initiated, I spoke to [facility] and she will have this sent to patient.

## 2018-01-25 NOTE — Telephone Encounter (Signed)
It would be a good idea for her to come in to check a glucose and for her to bring her meter to compare the results.

## 2018-01-25 NOTE — Telephone Encounter (Signed)
Copied from CRM [id number]. Topic: Quick Communication - See Telephone Encounter >> Jan 25, 2018 12:26 PM Rudi Coco, NT wrote: CRM for notification. See Telephone encounter for: 01/25/18.  Benancio Deeds calling from [facility] to let Dr. Birdie Sons know that pt. Blood sugars have been running low 70's to 40's. Pt. Has been advised to bring glucometer machine to appt. To let provider know what's been going on. Diamond Nickel can be reached at  [phone]

## 2018-01-26 ENCOUNTER — Telehealth: Payer: Self-pay | Admitting: Family Medicine

## 2018-01-26 NOTE — Telephone Encounter (Signed)
Pt dropped off [employer] claims form to be filled out placed in Dr. Lenore Manner folder upfront  Please [phone]

## 2018-01-26 NOTE — Telephone Encounter (Signed)
Patient came into office today with complaint of low cbg, POCT glucose was 85 patient reported she had not eaten anything this was at 1:30 in the afternoon , advised patient she needs to eat more often small meals. Home glucometer reading in office.90

## 2018-01-28 NOTE — Telephone Encounter (Signed)
Noted. I agree that she needs to try to get in 3 meals per day. If she is going to eat small meals she could eat 4-5 small meals per day.

## 2018-01-29 NOTE — Telephone Encounter (Signed)
Patient aware.

## 2018-01-29 NOTE — Telephone Encounter (Signed)
Placed in red folder  

## 2018-01-29 NOTE — Unmapped (Signed)
[facility] Refill Coordination Note    Specialty Medication(s) to be Shipped:   Infectious Disease: XIFAXAN 550MG     Other medication(s) to be shipped:       Darlene Thompson, DOB: Dec 25, 1965  Phone: [phone] (home)   Shipping Address: [address]  [address]  [place] [address]    All above HIPAA information was verified with patient.     Completed refill call assessment today to schedule patient's medication shipment from the [facility] [phone]).       Specialty medication(s) and dose(s) confirmed: Regimen is correct and unchanged.   Changes to medications: Darlene Thompson reports starting the following medications: NEW MED FOR ANXIETY AND APPETITE  Changes to insurance: No  Questions for the pharmacist: No    The patient will receive an FSI print out for each medication shipped and additional FDA Medication Guides as required.  Patient education from Darlene Thompson or Darlene Thompson may also be included in the shipment.    DISEASE-SPECIFIC INFORMATION        N/A    ADHERENCE              MEDICARE PART B DOCUMENTATION         SHIPPING     Shipping address confirmed in FSI.     Delivery Scheduled: Yes, Expected medication delivery date: [phone] via UPS or courier.     Darlene Thompson   [facility] Specialty Technician

## 2018-02-01 NOTE — Telephone Encounter (Signed)
Left message to return call, ok for pec to speak to patient 

## 2018-02-01 NOTE — Telephone Encounter (Signed)
I have reviewed the forms that she submitted to be filled out. She will need a follow-up visit to complete these forms. Please try to get her scheduled. Please also see if there is a due date for these forms as there is not one listed

## 2018-02-02 ENCOUNTER — Telehealth: Payer: Self-pay | Admitting: Family Medicine

## 2018-02-02 ENCOUNTER — Ambulatory Visit: Admit: 2018-02-02 | Discharge: 2018-02-03 | Payer: MEDICARE

## 2018-02-02 DIAGNOSIS — K769 Liver disease, unspecified: Secondary | ICD-10-CM

## 2018-02-02 DIAGNOSIS — K7030 Alcoholic cirrhosis of liver without ascites: Principal | ICD-10-CM

## 2018-02-02 DIAGNOSIS — J918 Pleural effusion in other conditions classified elsewhere: Secondary | ICD-10-CM

## 2018-02-02 LAB — CBC
HEMATOCRIT: 38.7 % (ref 36.0–46.0)
HEMOGLOBIN: 12.1 g/dL (ref 12.0–16.0)
MEAN CORPUSCULAR HEMOGLOBIN CONC: 31.3 g/dL (ref 31.0–37.0)
MEAN CORPUSCULAR VOLUME: 90.4 fL (ref 80.0–100.0)
MEAN PLATELET VOLUME: 7.6 fL (ref 7.0–10.0)
PLATELET COUNT: 228 10*9/L (ref 150–440)
RED BLOOD CELL COUNT: 4.29 10*12/L (ref 4.00–5.20)
RED CELL DISTRIBUTION WIDTH: 14.2 % (ref 12.0–15.0)

## 2018-02-02 LAB — HEMATOCRIT: Lab: 38.7

## 2018-02-02 LAB — COMPREHENSIVE METABOLIC PANEL
ALBUMIN: 3.2 g/dL — ABNORMAL LOW (ref 3.5–5.0)
ALKALINE PHOSPHATASE: 139 U/L — ABNORMAL HIGH (ref 38–126)
ALT (SGPT): 39 U/L (ref 15–48)
ANION GAP: 8 mmol/L — ABNORMAL LOW (ref 9–15)
AST (SGOT): 70 U/L — ABNORMAL HIGH (ref 14–38)
BILIRUBIN TOTAL: 1.2 mg/dL (ref 0.0–1.2)
BUN / CREAT RATIO: 16
CALCIUM: 9.2 mg/dL (ref 8.5–10.2)
CHLORIDE: 102 mmol/L (ref 98–107)
CO2: 30 mmol/L (ref 22.0–30.0)
EGFR MDRD AF AMER: >=60 mL/min/{1.73_m2} (ref >=60)
EGFR MDRD NON AF AMER: 59 mL/min/{1.73_m2} — ABNORMAL LOW (ref >=60)
GLUCOSE RANDOM: 61 mg/dL — ABNORMAL LOW (ref 65–179)
POTASSIUM: 3.8 mmol/L (ref 3.5–5.0)
PROTEIN TOTAL: 6.8 g/dL (ref 6.5–8.3)
SODIUM: 140 mmol/L (ref 135–145)

## 2018-02-02 LAB — PROTIME: Lab: 14.5 — ABNORMAL HIGH

## 2018-02-02 LAB — CREATININE: Creatinine:MCnc:Pt:Ser/Plas:Qn:: 0.99

## 2018-02-02 LAB — AMMONIA: Ammonia:SCnc:Pt:Plas:Qn:: 86 — ABNORMAL HIGH

## 2018-02-02 NOTE — Telephone Encounter (Signed)
Please advise 

## 2018-02-02 NOTE — Telephone Encounter (Signed)
Copied from CRM [id number]. Topic: Inquiry >> Feb 02, 2018  8:29 AM Crist Infante wrote: Reason for CRM: beatrice murphy with [facility] home care calling to make sure Dr Antony Haste will be signing pt's home health orders and she was seen 01/25/18..   Advised pt did not come for appt, and Bea states pt was not truthful and told her she came in for the dr appt. Diamond Nickel states they cannot keep seeing the pt if she cannot keep her dr appts.  Diamond Nickel states she will have a conversation with the pt Pt has appt 02/13/18

## 2018-02-02 NOTE — Telephone Encounter (Signed)
I will sign her home health orders.  We will try to keep the appointment with the patient as scheduled.  We will try to let home health know if she does not show up.

## 2018-02-02 NOTE — Telephone Encounter (Signed)
Notified beatrice that we will sign orders
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
## 2018-02-06 MED FILL — XIFAXAN/550MG/TABS: XIFAXAN/550MG/TABS | 30 days supply | Qty: 60 | Fill #3

## 2018-02-09 NOTE — Unmapped (Signed)
Left message to CB for pre-procedure instructions.

## 2018-02-12 ENCOUNTER — Ambulatory Visit: Admit: 2018-02-12 | Discharge: 2018-02-12 | Payer: MEDICARE

## 2018-02-12 DIAGNOSIS — K7030 Alcoholic cirrhosis of liver without ascites: Principal | ICD-10-CM

## 2018-02-12 DIAGNOSIS — K769 Liver disease, unspecified: Secondary | ICD-10-CM

## 2018-02-12 DIAGNOSIS — J918 Pleural effusion in other conditions classified elsewhere: Secondary | ICD-10-CM

## 2018-02-12 NOTE — Unmapped (Signed)
VIR Post-Procedure Note    Procedure Name: thoracentesis    Pre-Op Diagnosis & indication: hydrothorax    Post-Op Diagnosis: Same as pre-operative diagnosis    VIR Providers (Attending Physician): Ammie Dalton, MD    Time out: Prior to the procedure, a time out was performed with all team members present. During the time out, the patient, procedure and procedure site when applicable were verbally verified by the team members and Dr. Ammie Dalton.    Description of procedure: Successful thoracentesis under [place] for the right hydrothorax with no complication.    Plan: None    Sedation:None    Estimated Blood Loss: less than 5 mL  Complications: None    See detailed procedure note with images in PACS [facility]).    The patient tolerated the procedure well without incident or complication.    Interventional Radiologist: Ammie Dalton, MD  Date/Time: 02/12/2018 10:06 AM

## 2018-02-12 NOTE — Unmapped (Signed)
[facility] INTERVENTIONAL RADIOLOGY - Pre Procedure H/P  ??  CC: No chief complaint on file.    Diagnosis: hydrothorax    Assessment Plan and HPI: Darlene Thompson is a 52 y.o. female with history of hydrothorax who will undergo thoracentesis in Interventional Radiology.    Allergies:   Allergies   Allergen Reactions   ??? Sulfa (Sulfonamide Antibiotics) Hives     Historic from childhood   ??? Cymbalta [Duloxetine] Itching       Recent Labs:   WBC   Date Value Ref Range Status   02/02/2018 4.2 (L) 4.5 - 11.0 10*9/L Final   06/23/2016 6.0 3.4 - 10.8 x10E3/uL Final     HGB   Date Value Ref Range Status   02/02/2018 12.1 12.0 - 16.0 g/dL Final   16/06/9603 8.0 (L) 11.1 - 15.9 g/dL Final     HCT   Date Value Ref Range Status   02/02/2018 38.7 36.0 - 46.0 % Final   06/23/2016 22.7 (L) 34.0 - 46.6 % Final     Platelet   Date Value Ref Range Status   02/02/2018 228 150 - 440 10*9/L Final   06/23/2016 169 150 - 379 x10E3/uL Final     INR   Date Value Ref Range Status   02/02/2018 1.27  Final   06/23/2016 2.5 (H) 0.8 - 1.2 Final     Comment:     Reference interval is for non-anticoagulated patients.  Suggested INR therapeutic range for Vitamin K  antagonist therapy:     Standard Dose (moderate intensity                    therapeutic range):       2.0 - 3.0     Higher intensity therapeutic range       2.5 - 3.5       Total Bilirubin   Date Value Ref Range Status   02/02/2018 1.2 0.0 - 1.2 mg/dL Final   54/05/8118 3.9 (H) 0.0 - 1.2 mg/dL Final     Comment:     Note: Specimen is icteric.     Creatinine   Date Value Ref Range Status   02/02/2018 0.99 0.60 - 1.00 mg/dL Final   14/78/2956 2.13 0.57 - 1.00 mg/dL Final     AST   Date Value Ref Range Status   02/02/2018 70 (H) 14 - 38 U/L Final   06/23/2016 63 (H) 0 - 40 IU/L Final       Medications:   No current facility-administered medications for this encounter.        PMH:   Past Medical History:   Diagnosis Date   ??? Anemia    ??? Cirrhosis (CMS-HCC)    ??? Heart murmur        ASA Grade: ASA 2 - Patient with mild systemic disease with no functional limitations    PE:    There were no vitals filed for this visit.    General: NAD, AAO x 3 female in NAD.  Airway assessment: Class 2 - Can visualize soft palate and fauces, tip of uvula is obscured  Lungs: Respirations non-labored    CONSENT: This procedure has been fully reviewed with the patient/patient???s authorized representative. The risks, benefits and alternatives have been explained, and the patient/patient???s authorized representative has consented to the procedure.     The patient will accept blood products in an emergent situation.       Interventional Radiologist: Ammie Dalton, MD  Date/Time:  02/12/2018 9:51 AM

## 2018-02-12 NOTE — Unmapped (Signed)
Full discharge instructions and dressing supplies given to patient and family with full understanding stated.  Pt able to dress self.  Pt discharged to home with driver.

## 2018-02-13 ENCOUNTER — Other Ambulatory Visit: Payer: Self-pay

## 2018-02-13 ENCOUNTER — Ambulatory Visit (INDEPENDENT_AMBULATORY_CARE_PROVIDER_SITE_OTHER): Payer: Medicaid Other

## 2018-02-13 ENCOUNTER — Ambulatory Visit (INDEPENDENT_AMBULATORY_CARE_PROVIDER_SITE_OTHER): Payer: Medicaid Other | Admitting: Family Medicine

## 2018-02-13 ENCOUNTER — Telehealth: Payer: Self-pay

## 2018-02-13 ENCOUNTER — Encounter: Payer: Self-pay | Admitting: Family Medicine

## 2018-02-13 VITALS — BP 112/58 | HR 91 | Temp 97.8°F | Wt 193.4 lb

## 2018-02-13 DIAGNOSIS — R0689 Other abnormalities of breathing: Secondary | ICD-10-CM

## 2018-02-13 DIAGNOSIS — K7031 Alcoholic cirrhosis of liver with ascites: Secondary | ICD-10-CM | POA: Diagnosis not present

## 2018-02-13 DIAGNOSIS — E162 Hypoglycemia, unspecified: Secondary | ICD-10-CM | POA: Diagnosis not present

## 2018-02-13 DIAGNOSIS — J948 Other specified pleural conditions: Secondary | ICD-10-CM

## 2018-02-13 NOTE — Progress Notes (Signed)
Julie Alar, MD Phone: [phone]  Julie Graham is a 52 y.o. female who presents today for f/u.  CC: alcoholic cirrhosis  Patient notes she underwent thoracentesis yesterday.  Notes this hurt more than typical at the site of the insertion of the needle.  Notes she had some discomfort to her right shoulder while fluid was removed.  She was having discomfort in her right posterior lower ribs prior to the thoracentesis as well.  She does note some mild cough.  Her breathing is about the same.  She notes no injury to her ribs.  They did give her oxycodone.  She states they took off 1.7 L.  She is asking for refill on tramadol.  She does report she had issues with confusion last week where she felt very sick with nausea and body aches.  She went home and got in bed and fell asleep on Monday of last week into Tuesday.  They had difficulty waking her up at home.  They got her to take her lactulose and she started to improve.  She did not get looked at for this.  She notes she is back to her recent baseline at this point.  She continues on Lasix and spironolactone.  She states they are going to do albumin infusions soon to help with her accumulation of fluid.  She sees GI on Thursday for reevaluation.  She saw them a couple of weeks ago.  She notes no significant abdominal pain.  Reports this is at her baseline.  She notes her sugars were dropping into the 20s to 40s while she was in the hospital.  At home they often dropped down to the 40s and 50s about an hour after she eats.  She will eat something when they are low and then feel improved.  She feels she is eating adequately.  She has not been evaluated by endocrinology.  Social History   Tobacco Use  Smoking Status Never Smoker  Smokeless Tobacco Never Used     ROS see history of present illness  Objective  Physical Exam Vitals:   02/13/18 1120  BP: (!) 112/58  Pulse: 91  Temp: 97.8 F (36.6 C)  SpO2: 98%    BP Readings from  Last 3 Encounters:  02/13/18 (!) 112/58  11/13/17 100/60  07/18/17 110/68   Wt Readings from Last 3 Encounters:  02/13/18 193 lb 6.4 oz (87.7 kg)  11/13/17 178 lb (80.7 kg)  07/18/17 170 lb 9.6 oz (77.4 kg)    Physical Exam  Constitutional: No distress.  Cardiovascular: Normal rate, regular rhythm and normal heart sounds.  Pulmonary/Chest: Effort normal.  Decreased breath sounds lower half of right lung field, good air movement at the top of her right lung and in the left lung  Abdominal: Soft. Bowel sounds are normal. She exhibits no distension. There is no tenderness. There is no rebound and no guarding.  Musculoskeletal: She exhibits no edema.  Tender over her posterior right lower and mid ribs, bandage overlying the thoracentesis site with no surrounding erythema or swelling, no bony defects palpated  Neurological: She is alert.  Skin: Skin is warm and dry. She is not diaphoretic.     Assessment/Plan: Please see individual problem list.  Hypoglycemia Patient with persistent issues with low blood sugars.  Potentially this could be related to her cirrhosis versus poor p.o. intake.  Given the persistence I do think she needs to see endocrinology for further evaluation.  Referral placed.  She will continue to  try to eat well and if sugars are low take in glucose tablets or high glucose foods.  Alcoholic cirrhosis (HCC) Seems to be relatively stable since her last visit.  She underwent thoracentesis yesterday.  She is seeing GI later this week.  I reinforced the importance of consistently taking her lactulose.  I advised that if she has an episode like last week where she becomes confused and difficult to arouse she needs to be evaluated in the emergency department to consider hospitalization.  She does not appear confused today.  She will keep her appointment with GI.  Hydrothorax Status post thoracentesis yesterday.  I suspect the discomfort she has is either related to her fluid  accumulation or related to muscular strain given tenderness on exam.  No signs of infection.  We will obtain a chest x-ray to ensure no pneumothorax as this was not completed yesterday after her thoracentesis.  She is given return precautions.  Consider refill of tramadol once the chest x-ray returns.   Orders Placed This Encounter  Procedures  . DG Chest 2 View    Standing Status:   Future    Number of Occurrences:   1    Standing Expiration Date:   04/16/2019    Order Specific Question:   Reason for Exam (SYMPTOM  OR DIAGNOSIS REQUIRED)    Answer:   s/p thoracentesis of right lung yesterday, discomfort at site and decreased lung sounds    Order Specific Question:   Is patient pregnant?    Answer:   No    Order Specific Question:   Preferred imaging location?    Answer:   [employer] Specific Question:   Radiology Contrast Protocol - do NOT remove file path    Answer:   [email or web address]  . Ambulatory referral to Endocrinology    Referral Priority:   Routine    Referral Type:   Consultation    Referral Reason:   Specialty Services Required    Number of Visits Requested:   1    No orders of the defined types were placed in this encounter.    Julie Alar, MD [facility]

## 2018-02-13 NOTE — Telephone Encounter (Signed)
Copied from CRM [id number]. Topic: Quick Communication - Patient Running Late >> Feb 13, 2018 10:54 AM [name] wrote: Patient called and is running 5-10 minutes late.  Route to department's PEC pool.

## 2018-02-13 NOTE — Assessment & Plan Note (Signed)
Seems to be relatively stable since her last visit.  She underwent thoracentesis yesterday.  She is seeing GI later this week.  I reinforced the importance of consistently taking her lactulose.  I advised that if she has an episode like last week where she becomes confused and difficult to arouse she needs to be evaluated in the emergency department to consider hospitalization.  She does not appear confused today.  She will keep her appointment with GI.

## 2018-02-13 NOTE — Assessment & Plan Note (Signed)
Patient with persistent issues with low blood sugars.  Potentially this could be related to her cirrhosis versus poor p.o. intake.  Given the persistence I do think she needs to see endocrinology for further evaluation.  Referral placed.  She will continue to try to eat well and if sugars are low take in glucose tablets or high glucose foods.

## 2018-02-13 NOTE — Assessment & Plan Note (Addendum)
Status post thoracentesis yesterday.  I suspect the discomfort she has is either related to her fluid accumulation or related to muscular strain given tenderness on exam.  No signs of infection.  We will obtain a chest x-ray to ensure no pneumothorax as this was not completed yesterday after her thoracentesis.  She is given return precautions.  Consider refill of tramadol once the chest x-ray returns.

## 2018-02-13 NOTE — Patient Instructions (Signed)
Nice to see you. We will check a chest x-ray today.  If your breathing worsens or your pain worsens please be evaluated immediately. If you develop confusion please go to the hospital. We will get you to see endocrinology for evaluation of your low blood sugars.

## 2018-02-13 NOTE — Telephone Encounter (Signed)
Patient is in office today.

## 2018-02-14 ENCOUNTER — Telehealth: Payer: Self-pay

## 2018-02-14 ENCOUNTER — Other Ambulatory Visit: Payer: Self-pay | Admitting: Family Medicine

## 2018-02-14 MED ORDER — TRAMADOL HCL 50 MG PO TABS: 50.0000 mg | ORAL_TABLET | Freq: Three times a day (TID) | ORAL | 0 refills | Status: DC | PRN

## 2018-02-14 NOTE — Telephone Encounter (Signed)
Copied from CRM [id number]. Topic: General - Other >> Feb 14, 2018 10:52 AM Marylen Ponto wrote: Reason for CRM: Pt requesting status of x-ray and would like something for pain. Pt states she needs a mild pain relief medication. Cb# [phone]

## 2018-02-14 NOTE — Telephone Encounter (Signed)
Please advise 

## 2018-02-14 NOTE — Telephone Encounter (Signed)
Left message to return call, ok for pec to speak to patient to ask if her pain is worse then yesterday. If it is she needs to be evaluated by urgent care, walk in or ER

## 2018-02-14 NOTE — Telephone Encounter (Signed)
See result note.  

## 2018-02-14 NOTE — Telephone Encounter (Signed)
Patient called to receive lab results and says she is having real bad back pain and it's been hurting since she had the Thoracentesis yesterday. She asks can this be called in today. I advised this request will be sent to Dr. Birdie Sons.  Tramadol refill Last OV:02/13/18 Last refill:07/18/17 30 tab/0 refill ZOX:[id number] Pharmacy: [id number] - [place], [place] - [address] [phone] (Phone) [phone] (Fax)

## 2018-02-14 NOTE — Telephone Encounter (Signed)
Sent to pharmacy.  If her pain has worsened since I saw her yesterday she should be evaluated.

## 2018-02-15 ENCOUNTER — Telehealth: Payer: Self-pay | Admitting: Family Medicine

## 2018-02-15 ENCOUNTER — Institutional Professional Consult (permissible substitution): Admit: 2018-02-15 | Discharge: 2018-02-15 | Payer: MEDICARE

## 2018-02-15 DIAGNOSIS — K7030 Alcoholic cirrhosis of liver without ascites: Principal | ICD-10-CM

## 2018-02-15 DIAGNOSIS — K769 Liver disease, unspecified: Secondary | ICD-10-CM

## 2018-02-15 DIAGNOSIS — K7031 Alcoholic cirrhosis of liver with ascites: Secondary | ICD-10-CM

## 2018-02-15 DIAGNOSIS — K7290 Hepatic failure, unspecified without coma: Secondary | ICD-10-CM

## 2018-02-15 DIAGNOSIS — R112 Nausea with vomiting, unspecified: Secondary | ICD-10-CM

## 2018-02-15 DIAGNOSIS — J918 Pleural effusion in other conditions classified elsewhere: Secondary | ICD-10-CM

## 2018-02-15 DIAGNOSIS — R601 Generalized edema: Secondary | ICD-10-CM

## 2018-02-15 DIAGNOSIS — J948 Other specified pleural conditions: Secondary | ICD-10-CM

## 2018-02-15 LAB — COMPREHENSIVE METABOLIC PANEL
ALBUMIN: 2.9 g/dL — ABNORMAL LOW (ref 3.5–5.0)
ALKALINE PHOSPHATASE: 162 U/L — ABNORMAL HIGH (ref 38–126)
ALT (SGPT): 32 U/L (ref 15–48)
ANION GAP: 6 mmol/L — ABNORMAL LOW (ref 9–15)
AST (SGOT): 49 U/L — ABNORMAL HIGH (ref 14–38)
BILIRUBIN TOTAL: 0.8 mg/dL (ref 0.0–1.2)
BLOOD UREA NITROGEN: 15 mg/dL (ref 7–21)
BUN / CREAT RATIO: 20
CALCIUM: 8.7 mg/dL (ref 8.5–10.2)
CHLORIDE: 102 mmol/L (ref 98–107)
CO2: 29 mmol/L (ref 22.0–30.0)
EGFR MDRD AF AMER: >=60 mL/min/{1.73_m2} (ref >=60)
EGFR MDRD NON AF AMER: >=60 mL/min/{1.73_m2} (ref >=60)
GLUCOSE RANDOM: 79 mg/dL (ref 65–179)
POTASSIUM: 4.4 mmol/L (ref 3.5–5.0)
PROTEIN TOTAL: 6.3 g/dL — ABNORMAL LOW (ref 6.5–8.3)
SODIUM: 137 mmol/L (ref 135–145)

## 2018-02-15 LAB — CBC
HEMATOCRIT: 37.1 % (ref 36.0–46.0)
HEMOGLOBIN: 11.7 g/dL — ABNORMAL LOW (ref 12.0–16.0)
MEAN CORPUSCULAR HEMOGLOBIN CONC: 31.5 g/dL (ref 31.0–37.0)
MEAN CORPUSCULAR HEMOGLOBIN: 28.1 pg (ref 26.0–34.0)
MEAN CORPUSCULAR VOLUME: 89.4 fL (ref 80.0–100.0)
PLATELET COUNT: 259 10*9/L (ref 150–440)
RED BLOOD CELL COUNT: 4.15 10*12/L (ref 4.00–5.20)
RED CELL DISTRIBUTION WIDTH: 14.1 % (ref 12.0–15.0)
WBC ADJUSTED: 6.4 10*9/L (ref 4.5–11.0)

## 2018-02-15 LAB — GLUCOSE RANDOM: Glucose:MCnc:Pt:Ser/Plas:Qn:: 79

## 2018-02-15 LAB — MEAN CORPUSCULAR VOLUME: Lab: 89.4

## 2018-02-15 MED ORDER — ONDANSETRON HCL 4 MG TABLET
ORAL_TABLET | Freq: Three times a day (TID) | ORAL | 6 refills | 0 days | Status: CP | PRN
Start: 2018-02-15 — End: 2018-07-07

## 2018-02-15 MED ORDER — LACTULOSE 20 GRAM ORAL PACKET: 20 g | packet | Freq: Three times a day (TID) | 11 refills | 0 days | Status: AC

## 2018-02-15 MED ORDER — CIPROFLOXACIN 500 MG TABLET
ORAL_TABLET | Freq: Every day | ORAL | 11 refills | 0 days | Status: CP
Start: 2018-02-15 — End: 2018-02-16

## 2018-02-15 MED ORDER — LACTULOSE 20 GRAM ORAL PACKET
PACK | Freq: Three times a day (TID) | ORAL | 11 refills | 0.00000 days | Status: CP
Start: 2018-02-15 — End: 2018-10-17

## 2018-02-15 NOTE — Telephone Encounter (Signed)
I attempted to contact the patient to go over her disability forms as we were unable to go through these at her recent office visit.  There was no answer.  I left a message with no identifying information asking her to call back to the doctor's office.  We will await her call back.

## 2018-02-15 NOTE — Unmapped (Signed)
Pt here today for Albumin/Lasix administration.  22 gauge IV cath inserted in left AC vein x 1  attempt with blood drawn and sent to lab.  Albumin 25% 50 gm infused at 300 ml/hr on pump beginning at 1413 and completed at 1453.  Lasix 40 mg given IV push by Wyona Almas RN. VS repeated after infusion and normal.  IV cath removed intact and bleeding stopped with gauze pressure dressing applied to site.  Pt tolerated procedure well with no complaints and discharged to home with S/O.  See MAR and flow sheets.      Albumin/Lasix Infusion     Albumin 25%  25 gm x 20 min at 300 ml/hr  Lasix 40 mg IV push

## 2018-02-15 NOTE — Telephone Encounter (Signed)
Patient called, left VM to call back to discuss her back pain. Advised if it is not relieved with the Tramadol, that Dr. Birdie Sons advised to be evaluated in the office, UC, or ED.

## 2018-02-16 MED ORDER — CIPROFLOXACIN 500 MG TABLET
ORAL_TABLET | Freq: Every day | ORAL | 5 refills | 0.00000 days | Status: CP
Start: 2018-02-16 — End: 2018-11-29

## 2018-02-16 MED ORDER — FOLIC ACID 1 MG TABLET
ORAL_TABLET | 5 refills | 0 days | Status: CP
Start: 2018-02-16 — End: 2018-07-05

## 2018-02-16 NOTE — Unmapped (Signed)
Kristalose rx received at [facility]. Anticipated copay $3. Left message for patient to return call.

## 2018-02-22 ENCOUNTER — Institutional Professional Consult (permissible substitution): Admit: 2018-02-22 | Discharge: 2018-02-23 | Payer: MEDICARE

## 2018-02-22 DIAGNOSIS — R601 Generalized edema: Secondary | ICD-10-CM

## 2018-02-22 DIAGNOSIS — J948 Other specified pleural conditions: Secondary | ICD-10-CM

## 2018-02-22 DIAGNOSIS — K7031 Alcoholic cirrhosis of liver with ascites: Principal | ICD-10-CM

## 2018-02-22 LAB — COMPREHENSIVE METABOLIC PANEL
ALBUMIN: 3 g/dL — ABNORMAL LOW (ref 3.5–5.0)
ALT (SGPT): 38 U/L (ref 15–48)
ANION GAP: 10 mmol/L (ref 9–15)
AST (SGOT): 66 U/L — ABNORMAL HIGH (ref 14–38)
BILIRUBIN TOTAL: 0.9 mg/dL (ref 0.0–1.2)
BLOOD UREA NITROGEN: 14 mg/dL (ref 7–21)
BUN / CREAT RATIO: 19
CALCIUM: 9.1 mg/dL (ref 8.5–10.2)
CHLORIDE: 105 mmol/L (ref 98–107)
CREATININE: 0.73 mg/dL (ref 0.60–1.00)
EGFR MDRD AF AMER: >=60 mL/min/{1.73_m2} (ref >=60)
EGFR MDRD NON AF AMER: >=60 mL/min/{1.73_m2} (ref >=60)
POTASSIUM: 4 mmol/L (ref 3.5–5.0)
PROTEIN TOTAL: 6.4 g/dL — ABNORMAL LOW (ref 6.5–8.3)
SODIUM: 140 mmol/L (ref 135–145)

## 2018-02-22 LAB — MAGNESIUM: Magnesium:MCnc:Pt:Ser/Plas:Qn:: 1.9

## 2018-02-22 LAB — BLOOD UREA NITROGEN: Urea nitrogen:MCnc:Pt:Ser/Plas:Qn:: 14

## 2018-02-22 NOTE — Unmapped (Signed)
Patient here for an albumin infusion followed by IV lasix. Patient settled onto stretcher in the procedure room. Name, birth date, and allergies verified. Arm band placed on patient. PIV placed. Labs sent. Patient tolerated albumin and lasix without any difficulty. No signs of a reaction. Post infusion VS stable. PIV removed. Pressure held over site. Gauze with paper tape dressing applied to site. Patient discharged to home from GI medicine clinic accompanied by boyfriend.

## 2018-02-26 NOTE — Unmapped (Signed)
[facility] Refill Coordination Note    Specialty Medication(s) to be Shipped:   XIFAXIN 550    Other medication(s) to be shipped: Ramiro Harvest, DOB: [birth year]  Phone: [phone] (home)   Shipping Address: [address], [facility] [address]  All above HIPAA information was verified with patient.     Completed refill call assessment today to schedule patient's medication shipment from the [facility] [phone]).       Specialty medication(s) and dose(s) confirmed: Regimen is correct and unchanged.   Changes to medications: Teagan reports no changes reported at this time.  Changes to insurance: No  Questions for the pharmacist: No    The patient will receive an FSI print out for each medication shipped and additional FDA Medication Guides as required.  Patient education from Mark or Robet Leu may also be included in the shipment.    DISEASE-SPECIFIC INFORMATION        N/A    ADHERENCE     Medication Adherence    Patient reported X missed doses in the last month:  0  Specialty Medication:  XIFAXIN 550MG  #OH 10 DAYS  Patient is on additional specialty medications:  No  Informant:  patient          Refill Coordination    Has the Patients' Contact Information Changed:  No  Is the Shipping Address Different:  No         MEDICARE PART B DOCUMENTATION     Not Applicable    SHIPPING     Shipping address confirmed in FSI.     Delivery Scheduled: Yes, Expected medication delivery date: 03/06/18 via UPS or courier.     Lajean Silvius   [facility] Specialty Technician

## 2018-03-02 ENCOUNTER — Institutional Professional Consult (permissible substitution): Admit: 2018-03-02 | Discharge: 2018-03-03 | Payer: MEDICARE

## 2018-03-02 DIAGNOSIS — R601 Generalized edema: Secondary | ICD-10-CM

## 2018-03-02 DIAGNOSIS — J948 Other specified pleural conditions: Secondary | ICD-10-CM

## 2018-03-02 DIAGNOSIS — K7031 Alcoholic cirrhosis of liver with ascites: Principal | ICD-10-CM

## 2018-03-02 MED FILL — KRISTALOSE/20GM/PKT: KRISTALOSE/20GM/PKT | 30 days supply | Qty: 90 | Fill #0

## 2018-03-02 NOTE — Unmapped (Signed)
Patient here for albumin infusion and IV lasix. Settled onto stretcher in the procedure room. Name and birth date verified.  Allergies and medications verified. VS stable. PIV placed. Albumin infused. Lasix IV push given. No signs or symptoms of a reaction. Post infusion VS stable. PIV removed. Pressure held over site. Gauze with paper tape dressing applied. Patient discharged from GI medicine clinic to home accompanied by friend.

## 2018-03-03 NOTE — Telephone Encounter (Signed)
No call back at this time. Please contact the patient to see if she still needs the disability forms filled out and if so I will need to speak with her when I have time to go over the forms with her. Thanks.

## 2018-03-05 MED FILL — XIFAXAN/550MG/TABS: XIFAXAN/550MG/TABS | 30 days supply | Qty: 60 | Fill #4

## 2018-03-05 NOTE — Telephone Encounter (Signed)
Left message to return call, ok for pec to speak to patient 

## 2018-03-05 NOTE — Telephone Encounter (Signed)
Pt called back and was related Dr. Purvis SheffieldSonnenberg's message. Pt will await another phone call to give whatever information is needed

## 2018-03-05 NOTE — Telephone Encounter (Signed)
fyi

## 2018-03-08 ENCOUNTER — Institutional Professional Consult (permissible substitution): Admit: 2018-03-08 | Discharge: 2018-03-09 | Payer: MEDICARE

## 2018-03-08 DIAGNOSIS — R601 Generalized edema: Secondary | ICD-10-CM

## 2018-03-08 DIAGNOSIS — J948 Other specified pleural conditions: Secondary | ICD-10-CM

## 2018-03-08 DIAGNOSIS — K7031 Alcoholic cirrhosis of liver with ascites: Principal | ICD-10-CM

## 2018-03-08 NOTE — Telephone Encounter (Signed)
I attempted to contact the patient.  She did not answer.  I left a message asking her to call back to the office.

## 2018-03-08 NOTE — Unmapped (Signed)
Pt here today for Albumin/Lasix administration.  22 gauge IV cath inserted in left AC vein x 1  attempt with good blood return noted, flushed well with NS 10 ml.    Albumin 25% 50 gm infused at 300 ml/hr on pump beginning at 1402 and completed at 1445.  Lasix 40 mg given IV push by Delane Ginger RN. VS repeated after infusion and normal.  IV cath removed intact and bleeding stopped with gauze pressure dressing applied to site.  Pt tolerated procedure well with no complaints and discharged to home with S/O.  See MAR and flow sheets.      Albumin/Lasix Infusion     Albumin 25%  25 gm x 20 min at 300 ml/hr  Lasix 40 mg IV push

## 2018-03-08 NOTE — Unmapped (Signed)
Pt reports she is taking iron but does not know strength or formulation.

## 2018-03-12 NOTE — Telephone Encounter (Signed)
Sent letter

## 2018-03-15 ENCOUNTER — Institutional Professional Consult (permissible substitution): Admit: 2018-03-15 | Discharge: 2018-03-16 | Payer: MEDICARE

## 2018-03-15 DIAGNOSIS — K7031 Alcoholic cirrhosis of liver with ascites: Principal | ICD-10-CM

## 2018-03-15 DIAGNOSIS — J948 Other specified pleural conditions: Secondary | ICD-10-CM

## 2018-03-15 DIAGNOSIS — R601 Generalized edema: Secondary | ICD-10-CM

## 2018-03-15 LAB — COMPREHENSIVE METABOLIC PANEL
ALKALINE PHOSPHATASE: 125 U/L (ref 38–126)
ALT (SGPT): 42 U/L (ref 15–48)
ANION GAP: 11 mmol/L (ref 9–15)
AST (SGOT): 56 U/L — ABNORMAL HIGH (ref 14–38)
BILIRUBIN TOTAL: 1.3 mg/dL — ABNORMAL HIGH (ref 0.0–1.2)
BLOOD UREA NITROGEN: 18 mg/dL (ref 7–21)
BUN / CREAT RATIO: 18
CALCIUM: 9.5 mg/dL (ref 8.5–10.2)
CO2: 25 mmol/L (ref 22.0–30.0)
CREATININE: 1.01 mg/dL — ABNORMAL HIGH (ref 0.60–1.00)
EGFR MDRD AF AMER: >=60 mL/min/{1.73_m2} (ref >=60)
EGFR MDRD NON AF AMER: 58 mL/min/{1.73_m2} — ABNORMAL LOW (ref >=60)
GLUCOSE RANDOM: 86 mg/dL (ref 65–179)
POTASSIUM: 4.5 mmol/L (ref 3.5–5.0)
SODIUM: 142 mmol/L (ref 135–145)

## 2018-03-15 LAB — CO2: Carbon dioxide:SCnc:Pt:Ser/Plas:Qn:: 25

## 2018-03-15 NOTE — Unmapped (Signed)
Patient here for IV albumin, followed by a lasix push. Patient placed on the stretcher in the procedure room. Name and birth date verified before placing arm band on patient. Allergies verified. VS stable. PIV placed. Labs sent. Tolerated albumin and lasix without any difficulty. Post infusion VS stable. PIV removed. Pressure held over site. Gauze with paper tape dressing placed over site. Patient discharged to home from GI clinic accompanied by boyfriend.

## 2018-03-21 MED ORDER — MIRTAZAPINE 7.5 MG TABLET
ORAL_TABLET | Freq: Every evening | ORAL | 1 refills | 0.00000 days | Status: CP
Start: 2018-03-21 — End: 2018-11-29

## 2018-03-21 NOTE — Unmapped (Signed)
Refill mirtazapine to provider for co-sign

## 2018-03-22 ENCOUNTER — Institutional Professional Consult (permissible substitution): Admit: 2018-03-22 | Discharge: 2018-03-23 | Payer: MEDICARE

## 2018-03-22 DIAGNOSIS — K7030 Alcoholic cirrhosis of liver without ascites: Principal | ICD-10-CM

## 2018-03-22 DIAGNOSIS — R601 Generalized edema: Secondary | ICD-10-CM

## 2018-03-22 DIAGNOSIS — K7031 Alcoholic cirrhosis of liver with ascites: Secondary | ICD-10-CM

## 2018-03-22 DIAGNOSIS — J948 Other specified pleural conditions: Secondary | ICD-10-CM

## 2018-03-22 LAB — CBC
HEMATOCRIT: 38.5 % (ref 36.0–46.0)
HEMOGLOBIN: 12.3 g/dL (ref 12.0–16.0)
MEAN CORPUSCULAR HEMOGLOBIN CONC: 32 g/dL (ref 31.0–37.0)
MEAN CORPUSCULAR HEMOGLOBIN: 28.3 pg (ref 26.0–34.0)
MEAN CORPUSCULAR VOLUME: 88.3 fL (ref 80.0–100.0)
MEAN PLATELET VOLUME: 7.6 fL (ref 7.0–10.0)
PLATELET COUNT: 200 10*9/L (ref 150–440)
RED BLOOD CELL COUNT: 4.36 10*12/L (ref 4.00–5.20)
WBC ADJUSTED: 4.2 10*9/L — ABNORMAL LOW (ref 4.5–11.0)

## 2018-03-22 LAB — COMPREHENSIVE METABOLIC PANEL
ALBUMIN: 3.7 g/dL (ref 3.5–5.0)
ALKALINE PHOSPHATASE: 120 U/L (ref 38–126)
ALT (SGPT): 34 U/L (ref 15–48)
AST (SGOT): 58 U/L — ABNORMAL HIGH (ref 14–38)
BILIRUBIN TOTAL: 1 mg/dL (ref 0.0–1.2)
BLOOD UREA NITROGEN: 19 mg/dL (ref 7–21)
BUN / CREAT RATIO: 20
CHLORIDE: 105 mmol/L (ref 98–107)
CO2: 27 mmol/L (ref 22.0–30.0)
CREATININE: 0.96 mg/dL (ref 0.60–1.00)
EGFR CKD-EPI AA FEMALE: 80 mL/min/{1.73_m2} (ref >=60)
EGFR CKD-EPI NON-AA FEMALE: 69 mL/min/{1.73_m2} (ref >=60)
GLUCOSE RANDOM: 95 mg/dL (ref 65–179)
POTASSIUM: 4 mmol/L (ref 3.5–5.0)
PROTEIN TOTAL: 7.1 g/dL (ref 6.5–8.3)
SODIUM: 139 mmol/L (ref 135–145)

## 2018-03-22 LAB — MEAN CORPUSCULAR VOLUME: Lab: 88.3

## 2018-03-22 LAB — EGFR CKD-EPI NON-AA FEMALE: Lab: 69

## 2018-03-22 NOTE — Unmapped (Signed)
Patient received 50g Albumin infusion. Pt tolerated infusion. Lasix administered post infusiojn as ordered. Pt discharged from clinic in good condition ambulating with her boyfriend.

## 2018-03-30 NOTE — Unmapped (Signed)
[facility] Refill Coordination Note  Medication: XIFAXAN 550MG     Unable to reach patient to schedule shipment for medication being filled at [facility]. SPOKE WITH Darlene Thompson TOOK MSG TO HAVE PT CALL.  As this is the 3rd unsuccessful attempt to reach the patient, no additional phone call attempts will be made at this time.      Phone numbers attempted: [phone]  Last scheduled delivery: [id number]      Please call the [facility] at [phone] (option [phone]) should you have any further questions.      Thanks,  [facility] Specialty Team

## 2018-04-02 MED FILL — XIFAXAN/550MG/TABS: XIFAXAN/550MG/TABS | 30 days supply | Qty: 60 | Fill #5

## 2018-04-02 NOTE — Unmapped (Signed)
Reason for call: Coordinate refill of Xifaxan    [facility] has made multiple attempts to reach pt to coordinate refill of Xifaxan. P returned call to the office. Pt stated she continues to take Xifaxan 550 mg BID. Pt denies any side effects. Pt reports no changes to her medications. Pt would like to switch back to Lactulose (CHRONULAC)10 grams/15 ml solution 30 ml TID. Pt stated she needs to fill up to much liquid to get Kristalose down and would like to change back to [employer]. Pt reports only having a few packets available and will need it this week. Notified pt I will let Vallery Sa, FNP know and see if she can put in a new prescription. Pt has a new address: [address], [place], [place] [address]. Spoke with Meghan Pharmacist at [facility] and coordinated shipment of Xifaxan 550 mg BID to go out for delivery on 04/04/18.       Vertell Limber RN, BSN  Nursing Care Coordinator   Pharmacy Adult GI Medicine  [facility]  [address]   [place], [place] [address]  [phone]

## 2018-04-03 MED ORDER — LACTULOSE 20 GRAM ORAL PACKET
PACK | Freq: Two times a day (BID) | ORAL | 11 refills | 0 days | Status: CP | PRN
Start: 2018-04-03 — End: 2018-04-04

## 2018-04-03 MED ORDER — RIFAXIMIN 550 MG TABLET
ORAL_TABLET | Freq: Two times a day (BID) | ORAL | 4 refills | 0 days | Status: CP
Start: 2018-04-03 — End: ?
  Filled 2018-05-23: qty 60, 30d supply, fill #0

## 2018-04-03 NOTE — Unmapped (Signed)
Refill Xifaxan

## 2018-04-03 NOTE — Unmapped (Signed)
[facility] Refill Coordination Note  Specialty Medication(s): Xifaxan  Additional Medications shipped: na    Dametria Tuzzolino, DOB: 1966-05-26  Phone: [phone] (home) , Alternate phone contact: N/A  Phone or address changes today?: No  All above HIPAA information was verified with Eunice Blase - spoke with nurse to verify the following:  Shipping Address: [address]  [place] [address]   Insurance changes? No    Completed refill call assessment today to schedule patient's medication shipment from the [facility] [phone]).      Confirmed the medication and dosage are correct and have not changed: Yes, regimen is correct and unchanged.    Confirmed patient started or stopped the following medications in the past month:  No, there are no changes reported at this time.    Are you tolerating your medication?:  Tanazia reports tolerating the medication.    ADHERENCE    Did you miss any doses in the past 4 weeks? No missed doses reported.    FINANCIAL/SHIPPING    Delivery Scheduled: Yes, Expected medication delivery date: Wed, July 10     The patient will receive an FSI print out for each medication shipped and additional FDA Medication Guides as required.  Patient education from [place] or Robet Leu may also be included in the shipment    Wylma did not have any additional questions at this time.    Delivery address validated in FSI scheduling system: Yes, address listed in FSI is correct.    We will follow up with patient monthly for standard refill processing and delivery.      Thank you,  Tawanna Solo Shared [facility] Specialty Pharmacist

## 2018-04-04 MED ORDER — LACTULOSE 10 GRAM/15 ML ORAL SOLUTION
Freq: Three times a day (TID) | ORAL | 11 refills | 0.00000 days | Status: CP
Start: 2018-04-04 — End: 2018-04-04
  Filled 2018-05-23: 30d supply, fill #0

## 2018-04-04 MED ORDER — LACTULOSE 10 GRAM/15 ML ORAL SOLUTION: 30 mL | mL | Freq: Three times a day (TID) | 11 refills | 0 days | Status: AC

## 2018-04-04 MED FILL — LACTULOSE SOLUTION 10 GM/15ML//SOLN: LACTULOSE SOLUTION 10 GM/15ML//SOLN | 30 days supply | Qty: 2700 | Fill #0

## 2018-04-04 NOTE — Unmapped (Signed)
Refill sent to [facility].  

## 2018-04-04 NOTE — Unmapped (Signed)
Reason for call: Coordinate refill of Lactulose    Received message back from [facility] and new order was placed for Lactulose 10 gram/15 ml solution, 30 ml three times daily. Coordinated shipment with Maisie Fus at [facility] with delivery for set for  04/05/18. Confirmed address: [address], [place], [place] [address]. Called pt at phone number [phone] and left a VM with request for pt to return call.     Vertell Limber RN, BSN  Nursing Care Coordinator   Pharmacy Adult GI Medicine  [facility]  [address]   [place], [place] [address]  [phone]

## 2018-04-06 NOTE — Telephone Encounter (Signed)
Left message to return call, ok for pec to speak to patient and get more details and triage if needed

## 2018-04-06 NOTE — Telephone Encounter (Signed)
Patient calling back, needing call asap. Call back [phone]

## 2018-04-06 NOTE — Telephone Encounter (Signed)
fyi

## 2018-04-06 NOTE — Telephone Encounter (Signed)
Patient called and advised that Dr. Birdie SonsSonnenberg needs to speak to her in order to fill out the disability forms as noted in his note below. She says she has a new updated form that will need to be filled out. I spoke to the [place], [facility] who says Dr. Birdie SonsSonnenberg is not in the office today. I advised the patient that he is not in the office and to bring the forms by the office, so he will have them when he calls back, she verbalized understanding and says she will bring them next week.

## 2018-04-06 NOTE — Telephone Encounter (Signed)
Please advise 

## 2018-04-09 NOTE — Unmapped (Signed)
Refill request for lactulose packets. Refilled on 7-10 with lactulose suspension at patient's request.

## 2018-04-12 NOTE — Telephone Encounter (Signed)
Have these new forms been brought in?

## 2018-04-12 NOTE — Telephone Encounter (Signed)
Forms have not been received.

## 2018-04-12 NOTE — Telephone Encounter (Signed)
Please try to contact the patient to see if she can bring in the new forms.  I will not be able to contact her and discuss her disability forms until we have the new forms that need to be filled out.

## 2018-04-13 NOTE — Telephone Encounter (Signed)
Spoke with pt and she stated that she would bring the forms in sometime early next week.

## 2018-04-18 NOTE — Telephone Encounter (Signed)
Please contact the patient and see if she is going to drop these forms off.

## 2018-04-19 ENCOUNTER — Telehealth: Payer: Self-pay | Admitting: Family Medicine

## 2018-04-19 NOTE — Telephone Encounter (Signed)
Pt dropped off a [employer] form to be filled out. Placed in dr. Kermit BaloSonnenbergs color folder upfront. Please fax to [phone] when completed

## 2018-04-19 NOTE — Telephone Encounter (Signed)
Will await forms.

## 2018-04-19 NOTE — Telephone Encounter (Signed)
Patient states she will try to drop the forms off today

## 2018-04-19 NOTE — Telephone Encounter (Signed)
Placed on your desk. 

## 2018-04-26 NOTE — Telephone Encounter (Signed)
I attempted to contact the patient to discuss her forms and ask several questions.  There was no answer.  I asked her to call back to the office.  When she calls back please find out what limitation she has with standing, climbing, bending, use of her hands, sitting, walking, stooping, lifting, or psychological limitations.  Please see what level of physical labor she is able to do with regards to heavy work, medium manual activity, is she capable of light work, if she capable of Lawyerclerical administrative activity, or is she incapable of any sedentary activity.  Please additionally find out when she last worked and whether or not she is working part-time or full-time.  Thanks.

## 2018-04-26 NOTE — Telephone Encounter (Signed)
Ok for pec to speak to patient about message below

## 2018-04-27 NOTE — Unmapped (Signed)
.  1l 

## 2018-04-27 NOTE — Unmapped (Signed)
Patient doing well, has transitioned back to liquid lactulose. She did have a recent episode of feeling more sleepy/confused. We discussed that it's important to stay adherent to lactulose and she could talk with clinic about using an extra dose PRN during these times. No issues identified. Patient has sufficient refills.    [facility] Refill and Clinical Coordination Note  Medication(s): Xifaxan  Others: lactulose     Darlene Thompson, DOB: 05-16-1966  Phone: [phone] (home) , Alternate phone contact: N/A  Shipping address: [address]  [place]ene [place] [address]  Phone or address changes today?: No  All above HIPAA information verified.  Insurance changes? No    Completed refill and clinical call assessment today to schedule patient's medication shipment from the [facility] [phone]).      MEDICATION RECONCILIATION    Confirmed the medication and dosage are correct and have not changed: Yes, regimen is correct and unchanged.    Were there any changes to your medication(s) in the past month:  No, there are no changes reported at this time.    ADHERENCE    Is this medicine transplant or covered by Medicare Part B? No.    Did you miss any doses in the past 4 weeks? No missed doses reported.  Adherence counseling provided? Not needed     SIDE EFFECT MANAGEMENT    Are you tolerating your medication?:  Darlene Thompson reports tolerating the medication.  Side effect management discussed: None      Therapy is appropriate and should be continued.    Evidence of clinical benefit: See Epic note from 02/02/18      FINANCIAL/SHIPPING    Delivery Scheduled: Yes, Expected medication delivery date: Tues, Aug 6   Additional medications refilled: No additional medications/refills needed at this time.    The patient will receive an FSI print out for each medication shipped and additional FDA Medication Guides as required.  Patient education from [place] or Darlene Thompson may also be included in the shipment. Darlene Thompson did not have any additional questions at this time.    Delivery address validated in FSI scheduling system: Yes, address listed above is correct.      We will follow up with patient monthly for standard refill processing and delivery.      Thank you,  Darlene Thompson Shared [facility] Specialty Pharmacist

## 2018-04-30 MED FILL — LACTULOSE SOLUTION 10 GM/15ML//SOLN: LACTULOSE SOLUTION 10 GM/15ML//SOLN | 30 days supply | Qty: 2700 | Fill #1

## 2018-04-30 MED FILL — XIFAXAN/550MG/TABS: XIFAXAN/550MG/TABS | 30 days supply | Qty: 60 | Fill #6

## 2018-05-08 NOTE — Telephone Encounter (Signed)
No return call from patient.

## 2018-05-16 ENCOUNTER — Encounter: Payer: Self-pay | Admitting: Family Medicine

## 2018-05-16 ENCOUNTER — Ambulatory Visit (INDEPENDENT_AMBULATORY_CARE_PROVIDER_SITE_OTHER): Payer: Medicaid Other

## 2018-05-16 ENCOUNTER — Ambulatory Visit (INDEPENDENT_AMBULATORY_CARE_PROVIDER_SITE_OTHER): Payer: Medicaid Other | Admitting: Family Medicine

## 2018-05-16 VITALS — BP 108/62 | HR 81 | Temp 98.1°F | Ht 65.0 in | Wt 197.2 lb

## 2018-05-16 DIAGNOSIS — N950 Postmenopausal bleeding: Secondary | ICD-10-CM | POA: Diagnosis not present

## 2018-05-16 DIAGNOSIS — J948 Other specified pleural conditions: Secondary | ICD-10-CM

## 2018-05-16 DIAGNOSIS — K7031 Alcoholic cirrhosis of liver with ascites: Secondary | ICD-10-CM

## 2018-05-16 NOTE — Assessment & Plan Note (Signed)
Some concern that she has started to accumulate more fluid again.  Will obtain a chest x-ray.  I encouraged her to follow-up with her GI physician and she will contact them to set up an appointment.  Encouraged her to take her diuretics as prescribed and not eat watermelon.

## 2018-05-16 NOTE — Patient Instructions (Signed)
Nice to see you. We will get your forms filled out. Please call your GI office to set up follow-up. We will get an x-ray today. We will get lab work today as well.

## 2018-05-16 NOTE — Progress Notes (Signed)
Julie AlarEric Souleymane Saiki, MD Phone: [phone]  Julie Graham is a 52 y.o. female who presents today for f/u  CC: cirrhosis, hydrothorax, forms, abnormal menses  Patient continues to see GI for her cirrhosis.  She feels as though she has accumulated some additional fluid in her right lung.  Feels like she is not breathing in as deep on that side.  Voice is a little hoarse which typically happens when she accumulates fluid.  Notes her breathing is not 100%.  She did place a call to GI to get into see them for possible thoracentesis though has not heard anything back.  She does report she missed a couple of doses of fluid pills and did eat watermelon recently.  Patient has been out on long-term disability relating to her illness.  She notes shaking and tremoring at times if toxins build up and that makes it difficult for her to do things with her hands.  She has been more forgetful and has been unable to remember things and cannot focus as well since becoming ill with cirrhosis.  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  Patient reports she went a year without a menstrual cycle.  Over the last several months she has had one monthly lasting about 7 days.  Started on her cycle yesterday.  She does not want an exam today with me and would prefer to see her gynecologist for that.  Social History   Tobacco Use  Smoking Status Never Smoker  Smokeless Tobacco Never Used     ROS see history of present illness  Objective  Physical Exam Vitals:   05/16/18 1543  BP: 108/62  Pulse: 81  Temp: 98.1 F (36.7 C)  SpO2: 97%    BP Readings from Last 3 Encounters:  05/16/18 108/62  02/13/18 (!) 112/58  11/13/17 100/60   Wt Readings from Last 3 Encounters:  05/16/18 197 lb 3.2 oz (89.4 kg)  02/13/18 193 lb 6.4 oz (87.7 kg)  11/13/17 178 lb (80.7 kg)    Physical Exam  Constitutional: No distress.  Cardiovascular: Normal rate, regular rhythm  and normal heart sounds.  Pulmonary/Chest: Effort normal.  Slight decreased breath sounds in right lower lung field though middle and upper lung fields have good air movement, good air movement in the left lung  Musculoskeletal: She exhibits no edema.  Neurological: She is alert.  Skin: Skin is warm and dry. She is not diaphoretic.     Assessment/Plan: Please see individual problem list.  Hydrothorax Some concern that she has started to accumulate more fluid again.  Will obtain a chest x-ray.  I encouraged her to follow-up with her GI physician and she will contact them to set up an appointment.  Encouraged her to take her diuretics as prescribed and not eat watermelon.  Alcoholic cirrhosis (HCC) Patient is to follow-up with her GI physician.  We will complete her disability paperwork.  Postmenopausal bleeding Patient reports she went a year without a menstrual cycle and then started having menses again.  Some of that may be related to how ill she was previously though the concern for postmenopausal bleeding is there.  She deferred urine pregnancy testing though beta hCG was ordered.  She does not feel as though she would be pregnant.  We will refer to gynecology for evaluation.  She also declined exam in the office.   Orders Placed This Encounter  Procedures  . DG Chest 2  View    Standing Status:   Future    Number of Occurrences:   1    Standing Expiration Date:   07/17/2019    Order Specific Question:   Reason for Exam (SYMPTOM  OR DIAGNOSIS REQUIRED)    Answer:   history of hydrothorax, decreased breath sounds in right lower lung field    Order Specific Question:   Is patient pregnant?    Answer:   No    Order Specific Question:   Preferred imaging location?    Answer:   [facility]    Order Specific Question:   Radiology Contrast Protocol - do NOT remove file path    Answer:   \\[email or web address]s.pdf  . Basic Metabolic Panel  (BMET)  . B-HCG Quant  . Ambulatory referral to Gynecology    Referral Priority:   Routine    Referral Type:   Consultation    Referral Reason:   Specialty Services Required    Requested Specialty:   Gynecology    Number of Visits Requested:   1    No orders of the defined types were placed in this encounter.    Julie AlarEric Yanelly Cantrelle, MD [facility]

## 2018-05-16 NOTE — Assessment & Plan Note (Signed)
Patient is to follow-up with her GI physician.  We will complete her disability paperwork.

## 2018-05-16 NOTE — Assessment & Plan Note (Signed)
Patient reports she went a year without a menstrual cycle and then started having menses again.  Some of that may be related to how ill she was previously though the concern for postmenopausal bleeding is there.  She deferred urine pregnancy testing though beta hCG was ordered.  She does not feel as though she would be pregnant.  We will refer to gynecology for evaluation.  She also declined exam in the office.

## 2018-05-17 ENCOUNTER — Other Ambulatory Visit: Payer: Self-pay | Admitting: Family Medicine

## 2018-05-17 DIAGNOSIS — J90 Pleural effusion, not elsewhere classified: Secondary | ICD-10-CM

## 2018-05-17 LAB — BASIC METABOLIC PANEL
BUN: 15 mg/dL (ref 6–23)
CO2: 29 meq/L (ref 19–32)
CREATININE: 1.03 mg/dL (ref 0.40–1.20)
Calcium: 9 mg/dL (ref 8.4–10.5)
Chloride: 106 mEq/L (ref 96–112)
GFR: 60.18 mL/min (ref >60.00)
GLUCOSE: 81 mg/dL (ref 70–99)
Potassium: 4.7 mEq/L (ref 3.5–5.1)
Sodium: 140 mEq/L (ref 135–145)

## 2018-05-17 LAB — HCG, QUANTITATIVE, PREGNANCY: Quantitative HCG: 0.34 m[IU]/mL

## 2018-05-21 ENCOUNTER — Other Ambulatory Visit: Payer: Self-pay | Admitting: Family Medicine

## 2018-05-21 DIAGNOSIS — J90 Pleural effusion, not elsewhere classified: Secondary | ICD-10-CM

## 2018-05-22 MED ORDER — LACTULOSE 10 GRAM/15 ML ORAL SOLUTION: 30 mL | mL | Freq: Three times a day (TID) | 10 refills | 0 days | Status: AC

## 2018-05-22 NOTE — Unmapped (Signed)
[facility] Refill Coordination Note    Specialty Medication(s) to be Shipped:   Infectious Disease: Xifaxan    Other medication(s) to be shipped: LACTULOSE     Darlene Thompson, DOB: Jul 05, 1966  Phone: [phone] (home)   Shipping Address: [address]  [place] [address]    All above HIPAA information was verified with patient.     Completed refill call assessment today to schedule patient's medication shipment from the [facility] [phone]).       Specialty medication(s) and dose(s) confirmed: Regimen is correct and unchanged.   Changes to medications: Olesya reports no changes reported at this time.  Changes to insurance: No  Questions for the pharmacist: No    The patient will receive a drug information handout for each medication shipped and additional FDA Medication Guides as required.      DISEASE/MEDICATION-SPECIFIC INFORMATION        N/A    ADHERENCE              MEDICARE PART B DOCUMENTATION         SHIPPING     Shipping address confirmed in Epic.     Delivery Scheduled: Yes, Expected medication delivery date: 073019 [facility] via UPS or courier.     Antonietta Barcelona   [facility] Shared [facility] Specialty Technician

## 2018-05-23 MED FILL — LACTULOSE 10 GRAM/15 ML ORAL SOLUTION: 30 days supply | Qty: 2700 | Fill #0 | Status: AC

## 2018-05-23 MED FILL — XIFAXAN 550 MG TABLET: 30 days supply | Qty: 60 | Fill #0 | Status: AC

## 2018-05-29 ENCOUNTER — Ambulatory Visit: Admission: RE | Admit: 2018-05-29 | Payer: Self-pay | Source: Ambulatory Visit
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
## 2018-06-12 NOTE — Unmapped (Signed)
[facility] Refill Coordination Note  Specialty Medication(s): XIFAXAN 550MG   Additional Medications shipped:      Darlene Thompson, DOB: 06-Nov-1965  Phone: [phone] (home) , Alternate phone contact: N/A  Phone or address changes today?: No  All above HIPAA information was verified with patient.  Shipping Address: [address]  [place] [address]   Insurance changes? No    Completed refill call assessment today to schedule patient's medication shipment from the [facility] [phone]).      Confirmed the medication and dosage are correct and have not changed: Yes, regimen is correct and unchanged.    Confirmed patient started or stopped the following medications in the past month:  Yes. Darlene Thompson reports starting the following medications: IRON/MULTIVITAMIN/MAGNESIUM    Are you tolerating your medication?:  Darlene Thompson reports side effects of CRAVING SOUR THINGS,NAUSEOUS .    ADHERENCE    (Below is required for Medicare Part B or Transplant patients only - per drug):   How many tablets were dispensed last month: 60 TABS  Patient currently has 10 TABS remaining.    Did you miss any doses in the past 4 weeks? No missed doses reported.    FINANCIAL/SHIPPING    Delivery Scheduled: Yes, Expected medication delivery date: [id number] WFD-ND     The patient will receive a drug information handout for each medication shipped and additional FDA Medication Guides as required.      Darlene Thompson did not have any additional questions at this time.    Delivery address validated in Epic.    We will follow up with patient monthly for standard refill processing and delivery.      Thank you,  Antonietta Barcelona   [facility] Specialty Technician

## 2018-06-15 ENCOUNTER — Ambulatory Visit: Admit: 2018-06-15 | Discharge: 2018-06-15 | Payer: MEDICARE

## 2018-06-15 DIAGNOSIS — J948 Other specified pleural conditions: Principal | ICD-10-CM

## 2018-06-15 DIAGNOSIS — J90 Pleural effusion, not elsewhere classified: Principal | ICD-10-CM

## 2018-06-15 NOTE — Unmapped (Signed)
VIR Post-Procedure Note    Procedure Name: Thoracentesis    Pre-Op Diagnosis & indication: Hepatic hydrothorax    Post-Op Diagnosis: Same as pre-operative diagnosis    VIR Providers (Attending Physician): Elsworth Soho, MD    Time out: Prior to the procedure, a time out was performed with all team members present. During the time out, the patient, procedure and procedure site when applicable were verbally verified by the team members and Dr. Elsworth Soho.    Description of procedure: Successful thoracentese with aspiration of 1675 mL of clear yellow fluid.     Plan: Will obtain chest radiograph after procedure.    Sedation:None    Estimated Blood Loss: less than 5 mL  Complications: None    See detailed procedure note with images in PACS [facility]).    The patient tolerated the procedure well without incident or complication.    Interventional Radiologist: Elsworth Soho, MD  Date/Time: 06/15/2018 3:11 PM

## 2018-06-15 NOTE — Unmapped (Signed)
Assessment/Plan:    Ms. Hakala is a 52 y.o. female who will undergo thoracentesis in Interventional Radiology.      --This procedure has been fully reviewed with the patient/patient???s authorized representative. The risks, benefits and alternatives have been explained, and the patient/patient???s authorized representative has consented to the procedure.  --The patient will accept blood products in an emergent situation.  --The patient does not have a Do Not Resuscitate order in effect.      CC: No chief complaint on file.      HPI: Ms. Pecha is a 52 y.o. female who will undergo thoracentesis.    Allergies:   Allergies   Allergen Reactions   ??? Sulfa (Sulfonamide Antibiotics) Hives     Historic from childhood   ??? Cymbalta [Duloxetine] Itching       Medications:   No current facility-administered medications for this encounter.        PMH:   Past Medical History:   Diagnosis Date   ??? Anemia    ??? Cirrhosis (CMS-HCC)    ??? Heart murmur        ASA Grade: ASA 2 - Patient with mild systemic disease with no functional limitations    ROS:  General: Denies fever or chills.  Cardiovascular: Denies chest pain.   Pulmonary: Describes shortness of breath related to her pleural effusion.    Allergies: Please see allergy section.

## 2018-06-26 NOTE — Unmapped (Signed)
HE SPEED Outpatient Study Visit:    This is the patient's (SID 0011) 3 month follow up visit    The patient's ability to consent was assessed by Tawni Carnes and she was deemed to have capacity to provide consent.    The patient was presented the informed consent addendum form and I confirm the patient is agreeable to participate in the study with all questions, if applicable, having been answered to their satisfaction.    Clinical information as follows:    Etiology of cirrhosis: ETOH    BMI: 32.47    MELD-Na score: 10 at 02/02/2018  1:01 PM  MELD score: 10 at 02/02/2018  1:01 PM  Calculated from:  Serum Creatinine: 0.99 mg/dL (Rounded to 1 mg/dL) at 1/61/0960  4:54 PM  Serum Sodium: 140 mmol/L (Rounded to 137 mmol/L) at 02/02/2018  1:01 PM  Total Bilirubin: 1.2 mg/dL at 0/98/1191  4:78 PM  INR(ratio): 1.27 at 02/02/2018  1:01 PM  Age: 52 years
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
## 2018-07-05 MED ORDER — FOLIC ACID 1 MG TABLET
ORAL_TABLET | 1 refills | 0 days | Status: CP
Start: 2018-07-05 — End: ?

## 2018-07-09 MED ORDER — ONDANSETRON HCL 4 MG TABLET
ORAL_TABLET | Freq: Three times a day (TID) | ORAL | 6 refills | 0.00000 days | Status: CP | PRN
Start: 2018-07-09 — End: 2018-12-25

## 2018-07-09 MED ORDER — SPIRONOLACTONE 100 MG TABLET
ORAL_TABLET | 1 refills | 0 days | Status: CP
Start: 2018-07-09 — End: 2018-08-29

## 2018-07-19 ENCOUNTER — Ambulatory Visit: Admit: 2018-07-19 | Discharge: 2018-07-20

## 2018-07-19 DIAGNOSIS — K7031 Alcoholic cirrhosis of liver with ascites: Principal | ICD-10-CM

## 2018-07-19 NOTE — Unmapped (Addendum)
Take all your fluid pills in the AM      Referral to Pulmonologist         Please follow a 2000mg  sodium restricted diet  Increase your protein intake by eating eggs, chicken, fish, yogurt and/or supplements.       It is OK to take up to 2000mg  of acetaminophen (tylenol), please be cautious of combination products. Do not take ibuprofen, naproxen, aspirin, advil, aleve, motrin, Excedrin or headache powders. Avoid herbal or natural supplements.  If you have questions about over-the-counter medications please contact our nurse, Jearld Lesch at [phone].     Please Return to clinic in 4 months  If you have not received a notice about a follow-up appointment please call the [facility] at [phone]

## 2018-07-20 NOTE — Unmapped (Signed)
Called patient to check on the status of her returning the required documents for [facility] manufacturer assistance.    She stated she just got the required documents and will fax it to [facility] ASAP.  She reports only having approximately 2 days of medication left.    Corliss Skains. [place], [place].[facility]  [phone] option 4
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
## 2018-07-24 NOTE — Unmapped (Signed)
Spoke with [employer]. Thoracentesis ordered to be done at [place]. And Vikki entered the pulm referral.

## 2018-07-24 NOTE — Unmapped (Signed)
Patient called and stated she is having a harder time breathing at night. She doesn't have insurance right now. She has applied for charity care. She has also filed to get xifaxan through [employer]. She states she has not heard from the pulmonary referral schedulers yet. I will discuss this with Vallery Sa and I will call Ms Glomski back with update.

## 2018-07-27 ENCOUNTER — Ambulatory Visit: Admit: 2018-07-27 | Discharge: 2018-07-27

## 2018-07-27 DIAGNOSIS — K769 Liver disease, unspecified: Principal | ICD-10-CM

## 2018-07-27 DIAGNOSIS — J918 Pleural effusion in other conditions classified elsewhere: Secondary | ICD-10-CM

## 2018-07-27 NOTE — Unmapped (Signed)
52 yo female with hepatic hydrothorax for right thoracentesis    After discussing the risks and benefits of thoracentesis, the patient has given written consent to proceed. The patient gives permission to have a blood transfused if medically necessary and does not have a DNR in place.      We will proceed with right thoracentesis with anxiolysis only.

## 2018-07-27 NOTE — Unmapped (Signed)
VIR BRIEF PROCEDURE NOTE      Patient: Darlene Thompson            MRN: [id number]    July 27, 2018 12:16 PM     Procedure: R thoracentesis    Post Procedure Diagnosis: hepatic hydrothorax    Attending Physician: Maree Erie    Assistant(s): None    Findings: 1500 ml removed    Samples Obtained: None    Blood Loss: 1 ml        Maree Erie, MD     July 27, 2018 12:16 PM

## 2018-08-06 NOTE — Unmapped (Signed)
Reason for call: Notify pt she has been approved for Xifaxan 550 mg through [facility]    Medication (Brand/Generic): Xifaxan   Patient Assistance Program: [facility]   Coverage Dates: 08/01/2018 - 08/02/2019   Dispensing Pharmacy : [phone]     Called pt to notify she has been approved for [facility] assistance thru [facility] for Xifaxan 550 mg BID. Pt stated she received her first 90 day supply in the mail. Notified pt she is approved through 08/02/19 and will need to complete a new application in Oct 2020. Pt verbalized understanding and thanked me for the call. Provided the phone number so pt could schedule refills when she is down to 10 day supply.    Vertell Limber RN, BSN  Nursing Care Coordinator   Pharmacy Adult GI Medicine  [facility]  [address]   [place], [place] [address]  [phone]

## 2018-08-14 ENCOUNTER — Telehealth: Payer: Self-pay

## 2018-08-14 NOTE — Telephone Encounter (Signed)
Copied from CRM [id number]. Topic: General - Other >> Aug 14, 2018 12:50 PM Gerrianne ScalePayne, Angela L wrote: Reason for CRM: pt calling wanting to know if she need to keep her appt for Friday 08-17-18 pt states that she had family medicaid now and want to know if it cover OV or sickness she doesn't have any money to come into office on Friday please give pt a call back at [phone]   **I spoke with patient & she is going to try to contact medicaid to see what may be covered before canceling Fridays appointment. I asked that she make sure if she does cancel to call 24 hrs in advance.**

## 2018-08-17 ENCOUNTER — Ambulatory Visit (INDEPENDENT_AMBULATORY_CARE_PROVIDER_SITE_OTHER): Payer: Self-pay | Admitting: Family Medicine

## 2018-08-17 ENCOUNTER — Encounter: Payer: Self-pay | Admitting: Family Medicine

## 2018-08-17 VITALS — BP 118/60 | HR 60 | Temp 98.2°F | Ht 65.0 in | Wt 201.8 lb

## 2018-08-17 DIAGNOSIS — R7309 Other abnormal glucose: Secondary | ICD-10-CM

## 2018-08-17 DIAGNOSIS — J948 Other specified pleural conditions: Secondary | ICD-10-CM

## 2018-08-17 DIAGNOSIS — F1011 Alcohol abuse, in remission: Secondary | ICD-10-CM

## 2018-08-17 DIAGNOSIS — E162 Hypoglycemia, unspecified: Secondary | ICD-10-CM

## 2018-08-17 DIAGNOSIS — K7031 Alcoholic cirrhosis of liver with ascites: Secondary | ICD-10-CM

## 2018-08-17 DIAGNOSIS — J90 Pleural effusion, not elsewhere classified: Secondary | ICD-10-CM

## 2018-08-17 DIAGNOSIS — N950 Postmenopausal bleeding: Secondary | ICD-10-CM

## 2018-08-17 LAB — POCT URINE PREGNANCY: Preg Test, Ur: NEGATIVE

## 2018-08-17 NOTE — Patient Instructions (Signed)
Nice to see you. We will get you set up with endocrinology, pulmonology, and gynecology. Please continue your medications through GI as they have prescribed them.

## 2018-08-17 NOTE — Assessment & Plan Note (Signed)
No longer drinking alcohol 

## 2018-08-17 NOTE — Assessment & Plan Note (Signed)
Patient will continue to see GI and they will manage her current medication regimen.

## 2018-08-17 NOTE — Assessment & Plan Note (Signed)
Seems to be doing fairly well after most recent thoracentesis.  Her lungs sound better than they have previously.

## 2018-08-17 NOTE — Assessment & Plan Note (Signed)
Question whether or not she was actually in menopause.  This may have been related to the stress she was under from her medical conditions.  It sounds as though she may be having normal menses now.  We will have her see GYN.  I discussed the importance of this as an underlying cancer could cause her to be bleeding after not having a menstrual cycle for over a year.

## 2018-08-17 NOTE — Assessment & Plan Note (Signed)
Patient missed her appointment with endocrinology.  I have placed another referral.  She will look for the contact information and call them if she has it.

## 2018-08-17 NOTE — Progress Notes (Signed)
Julie Alar, MD Phone: [phone]  Julie Graham is a 52 y.o. female who presents today for f/u.  CC: cirrhosis, postmenopausal bleeding, hypoglycemia  Cirrhosis: Patient recently followed up with GI at [facility].  She continues to occasionally miss doses of her Lasix and spironolactone though overall notes her fluid accumulation has done fairly well.  Minimal swelling at times.  She is compliant with her lactulose and rifaximin.  She reports she had a thoracentesis for hydrothorax around November 1.  She notes they are not able to take all the fluid out as she notes the thoracentesis takes her breath away and causes pain.  They have referred her to pulmonology given recurrent pleural effusions and potential for trapped lung.  She has not heard anything regarding this.  Hypoglycemia: Notes her CBGs are not dropping nearly as much as it used to though they do still drop and oftentimes within 30 minutes of eating.  She will drink some juice and feel improved.  The lowest it has gotten in the past is 30.  She had an appointment with endocrinology though did not end up seeing them.  Postmenopausal bleeding: Patient went over a year without a menstrual cycle.  She started to have bleeding previously and was referred to gynecology though did not end up seeing anyone.  She notes 1 time monthly she will have bleeding that lasts a few days and is light.  She notes this is consistent with prior menses though is lighter.  No intermenstrual bleeding.  Social History   Tobacco Use  Smoking Status Never Smoker  Smokeless Tobacco Never Used     ROS see history of present illness  Objective  Physical Exam Vitals:   08/17/18 1118  BP: 118/60  Pulse: 60  Temp: 98.2 F (36.8 C)  SpO2: 98%    BP Readings from Last 3 Encounters:  08/17/18 118/60  05/16/18 108/62  02/13/18 (!) 112/58   Wt Readings from Last 3 Encounters:  08/17/18 201 lb 12.8 oz (91.5 kg)  05/16/18 197 lb 3.2 oz (89.4 kg)    02/13/18 193 lb 6.4 oz (87.7 kg)    Physical Exam  Constitutional: No distress.  Cardiovascular: Normal rate, regular rhythm and normal heart sounds.  Pulmonary/Chest: Effort normal.  Slight decreased breath sounds right lung base, good air movement otherwise  Abdominal: Soft. Bowel sounds are normal. She exhibits no distension. There is no tenderness. There is no rebound and no guarding.  Musculoskeletal: She exhibits no edema.  Neurological: She is alert.  Skin: Skin is warm and dry. She is not diaphoretic.     Assessment/Plan: Please see individual problem list.  Hydrothorax Seems to be doing fairly well after most recent thoracentesis.  Her lungs sound better than they have previously.  Alcoholic cirrhosis (HCC) Patient will continue to see GI and they will manage her current medication regimen.  History of alcohol abuse No longer drinking alcohol.  Postmenopausal bleeding Question whether or not she was actually in menopause.  This may have been related to the stress she was under from her medical conditions.  It sounds as though she may be having normal menses now.  We will have her see GYN.  I discussed the importance of this as an underlying cancer could cause her to be bleeding after not having a menstrual cycle for over a year.  Hypoglycemia Patient missed her appointment with endocrinology.  I have placed another referral.  She will look for the contact information and call them if  she has it.   Orders Placed This Encounter  Procedures  . Ambulatory referral to Pulmonology    Referral Priority:   Routine    Referral Type:   Consultation    Referral Reason:   Specialty Services Required    Requested Specialty:   Pulmonary Disease    Number of Visits Requested:   1  . Ambulatory referral to Endocrinology    Referral Priority:   Routine    Referral Type:   Consultation    Referral Reason:   Specialty Services Required    Number of Visits Requested:   1  .  Ambulatory referral to Gynecology    Referral Priority:   Routine    Referral Type:   Consultation    Referral Reason:   Specialty Services Required    Requested Specialty:   Gynecology    Number of Visits Requested:   1  . POCT urine pregnancy    No orders of the defined types were placed in this encounter.    Julie AlarEric Desirie Minteer, MD [facility]

## 2018-08-29 MED ORDER — SPIRONOLACTONE 100 MG TABLET
ORAL_TABLET | Freq: Every day | ORAL | 2 refills | 0.00000 days | Status: CP
Start: 2018-08-29 — End: ?

## 2018-09-11 ENCOUNTER — Ambulatory Visit: Admit: 2018-09-11 | Discharge: 2018-09-12

## 2018-09-11 DIAGNOSIS — R911 Solitary pulmonary nodule: Secondary | ICD-10-CM

## 2018-09-11 DIAGNOSIS — J90 Pleural effusion, not elsewhere classified: Principal | ICD-10-CM

## 2018-09-28 ENCOUNTER — Ambulatory Visit: Admit: 2018-09-28 | Discharge: 2018-09-29

## 2018-09-28 ENCOUNTER — Ambulatory Visit: Admit: 2018-09-28 | Discharge: 2018-09-28

## 2018-09-28 DIAGNOSIS — R911 Solitary pulmonary nodule: Principal | ICD-10-CM

## 2018-10-17 MED ORDER — LACTULOSE 10 GRAM/15 ML ORAL SOLUTION
Freq: Three times a day (TID) | ORAL | 11 refills | 0.00000 days | Status: CP
Start: 2018-10-17 — End: ?

## 2018-10-22 MED ORDER — XIFAXAN 550 MG TABLET
ORAL_TABLET | ORAL | 8 refills | 0 days | Status: CP
Start: 2018-10-22 — End: 2019-10-22

## 2018-10-24 ENCOUNTER — Ambulatory Visit: Admit: 2018-10-24 | Discharge: 2018-10-24 | Payer: MEDICARE

## 2018-11-05 ENCOUNTER — Ambulatory Visit: Admit: 2018-11-05 | Discharge: 2018-11-06 | Payer: MEDICARE

## 2018-11-05 DIAGNOSIS — K7030 Alcoholic cirrhosis of liver without ascites: Principal | ICD-10-CM

## 2018-11-05 DIAGNOSIS — K769 Liver disease, unspecified: Secondary | ICD-10-CM

## 2018-11-19 ENCOUNTER — Ambulatory Visit: Admit: 2018-11-19 | Discharge: 2018-11-20 | Payer: MEDICARE

## 2018-11-19 DIAGNOSIS — K7030 Alcoholic cirrhosis of liver without ascites: Principal | ICD-10-CM

## 2018-11-19 DIAGNOSIS — K769 Liver disease, unspecified: Secondary | ICD-10-CM

## 2018-11-29 ENCOUNTER — Ambulatory Visit: Admit: 2018-11-29 | Discharge: 2018-11-30 | Payer: MEDICARE

## 2018-11-29 DIAGNOSIS — R011 Cardiac murmur, unspecified: Principal | ICD-10-CM

## 2018-11-29 DIAGNOSIS — D649 Anemia, unspecified: Principal | ICD-10-CM

## 2018-11-29 DIAGNOSIS — R110 Nausea: Principal | ICD-10-CM

## 2018-11-29 DIAGNOSIS — K7460 Unspecified cirrhosis of liver: Principal | ICD-10-CM

## 2018-12-11 DIAGNOSIS — K7460 Unspecified cirrhosis of liver: Principal | ICD-10-CM

## 2018-12-11 DIAGNOSIS — Z79899 Other long term (current) drug therapy: Principal | ICD-10-CM

## 2018-12-11 DIAGNOSIS — Z9884 Bariatric surgery status: Principal | ICD-10-CM

## 2018-12-11 DIAGNOSIS — J90 Pleural effusion, not elsewhere classified: Principal | ICD-10-CM

## 2018-12-11 DIAGNOSIS — Z888 Allergy status to other drugs, medicaments and biological substances status: Principal | ICD-10-CM

## 2018-12-11 DIAGNOSIS — Z9049 Acquired absence of other specified parts of digestive tract: Principal | ICD-10-CM

## 2018-12-11 DIAGNOSIS — D649 Anemia, unspecified: Principal | ICD-10-CM

## 2018-12-11 DIAGNOSIS — Z882 Allergy status to sulfonamides status: Principal | ICD-10-CM

## 2018-12-12 DIAGNOSIS — R160 Hepatomegaly, not elsewhere classified: Principal | ICD-10-CM

## 2018-12-12 DIAGNOSIS — R110 Nausea: Principal | ICD-10-CM

## 2018-12-13 ENCOUNTER — Ambulatory Visit: Admit: 2018-12-13 | Discharge: 2018-12-13

## 2018-12-13 DIAGNOSIS — R011 Cardiac murmur, unspecified: Principal | ICD-10-CM

## 2018-12-13 DIAGNOSIS — K7460 Unspecified cirrhosis of liver: Principal | ICD-10-CM

## 2018-12-13 DIAGNOSIS — D649 Anemia, unspecified: Principal | ICD-10-CM

## 2018-12-17 ENCOUNTER — Ambulatory Visit: Payer: Medicaid Other | Admitting: Family Medicine

## 2018-12-19 DIAGNOSIS — G629 Polyneuropathy, unspecified: Principal | ICD-10-CM

## 2018-12-19 DIAGNOSIS — K7030 Alcoholic cirrhosis of liver without ascites: Principal | ICD-10-CM

## 2018-12-19 MED ORDER — FUROSEMIDE 40 MG TABLET
ORAL_TABLET | Freq: Every day | ORAL | 11 refills | 0.00000 days | Status: CP
Start: 2018-12-19 — End: ?

## 2018-12-19 MED ORDER — GABAPENTIN 100 MG CAPSULE
ORAL_CAPSULE | Freq: Every evening | ORAL | 3 refills | 0 days | Status: CP
Start: 2018-12-19 — End: 2019-12-19

## 2018-12-25 DIAGNOSIS — K7030 Alcoholic cirrhosis of liver without ascites: Principal | ICD-10-CM

## 2018-12-25 DIAGNOSIS — R112 Nausea with vomiting, unspecified: Principal | ICD-10-CM

## 2018-12-25 MED ORDER — ONDANSETRON HCL 4 MG TABLET
ORAL_TABLET | Freq: Three times a day (TID) | ORAL | 11 refills | 0.00000 days | Status: CP | PRN
Start: 2018-12-25 — End: 2019-03-24
  Filled 2018-12-26: qty 30, 10d supply, fill #0

## 2018-12-26 MED FILL — ONDANSETRON HCL 4 MG TABLET: 10 days supply | Qty: 30 | Fill #0 | Status: AC

## 2018-12-31 MED ORDER — FERROUS SULFATE 325 MG (65 MG IRON) TABLET
ORAL_TABLET | Freq: Every day | ORAL | 2 refills | 0 days | Status: CP
Start: 2018-12-31 — End: 2019-03-31

## 2019-02-19 ENCOUNTER — Ambulatory Visit: Admit: 2019-02-19 | Discharge: 2019-02-20 | Payer: MEDICARE

## 2019-02-19 DIAGNOSIS — R160 Hepatomegaly, not elsewhere classified: Principal | ICD-10-CM

## 2019-02-19 MED ORDER — PEG-ELECTROLYTE SOLUTION 420 GRAM ORAL SOLUTION
0 refills | 0 days | Status: CP
Start: 2019-02-19 — End: 2019-03-24

## 2019-03-07 ENCOUNTER — Telehealth: Admit: 2019-03-07 | Discharge: 2019-03-08 | Payer: MEDICARE

## 2019-03-07 DIAGNOSIS — K7030 Alcoholic cirrhosis of liver without ascites: Secondary | ICD-10-CM

## 2019-03-07 DIAGNOSIS — R110 Nausea: Secondary | ICD-10-CM

## 2019-03-07 DIAGNOSIS — K7031 Alcoholic cirrhosis of liver with ascites: Principal | ICD-10-CM

## 2019-03-16 ENCOUNTER — Ambulatory Visit: Admit: 2019-03-16 | Discharge: 2019-03-17 | Payer: MEDICARE

## 2019-03-16 DIAGNOSIS — Z1159 Encounter for screening for other viral diseases: Principal | ICD-10-CM

## 2019-03-18 ENCOUNTER — Ambulatory Visit: Admit: 2019-03-18 | Discharge: 2019-03-18 | Payer: MEDICARE

## 2019-03-22 ENCOUNTER — Telehealth
Admit: 2019-03-22 | Discharge: 2019-03-23 | Payer: MEDICARE | Attending: Student in an Organized Health Care Education/Training Program | Primary: Student in an Organized Health Care Education/Training Program

## 2019-03-22 DIAGNOSIS — K912 Postsurgical malabsorption, not elsewhere classified: Principal | ICD-10-CM

## 2019-03-22 DIAGNOSIS — K7031 Alcoholic cirrhosis of liver with ascites: Secondary | ICD-10-CM

## 2019-04-10 ENCOUNTER — Ambulatory Visit (INDEPENDENT_AMBULATORY_CARE_PROVIDER_SITE_OTHER): Payer: Medicare Other | Admitting: Family Medicine

## 2019-04-10 ENCOUNTER — Other Ambulatory Visit: Payer: Self-pay

## 2019-04-10 ENCOUNTER — Encounter: Payer: Self-pay | Admitting: Family Medicine

## 2019-04-10 DIAGNOSIS — F419 Anxiety disorder, unspecified: Secondary | ICD-10-CM

## 2019-04-10 DIAGNOSIS — K7031 Alcoholic cirrhosis of liver with ascites: Secondary | ICD-10-CM | POA: Diagnosis not present

## 2019-04-10 DIAGNOSIS — E162 Hypoglycemia, unspecified: Secondary | ICD-10-CM

## 2019-04-10 DIAGNOSIS — J948 Other specified pleural conditions: Secondary | ICD-10-CM | POA: Diagnosis not present

## 2019-04-10 DIAGNOSIS — F329 Major depressive disorder, single episode, unspecified: Secondary | ICD-10-CM

## 2019-04-10 DIAGNOSIS — N950 Postmenopausal bleeding: Secondary | ICD-10-CM

## 2019-04-10 NOTE — Progress Notes (Signed)
Virtual Visit via telephone Note  This visit type was conducted due to national recommendations for restrictions regarding the COVID-19 pandemic (e.g. social distancing).  This format is felt to be most appropriate for this patient at this time.  All issues noted in this document were discussed and addressed.  No physical exam was performed (except for noted visual exam findings with Video Visits).   I connected with Julie Graham today at  2:45 PM EDT by telephone and verified that I am speaking with the correct person using two identifiers. Location patient: car, not driving Location provider: work  Persons participating in the virtual visit: patient, provider  I discussed the limitations, risks, security and privacy concerns of performing an evaluation and management service by telephone and the availability of in person appointments. I also discussed with the patient that there may be a patient responsible charge related to this service. The patient expressed understanding and agreed to proceed.  Interactive audio and video telecommunications were attempted between this provider and patient, however failed, due to patient having technical difficulties OR patient did not have access to video capability.  We continued and completed visit with audio only.  Reason for visit: follow-up  HPI: Cirrhosis: Patient notes overall she is doing well.  She has continued to have issues with hydrothorax and fluid building up.  She has to periodically have thoracenteses.  She does note if she misses doses of her spironolactone and Lasix she will have issues with fluid buildup.  She continues on lactulose and rifaximin and has had no issues with altered mental status.  She does follow with pulmonology and gastroenterology.  She has mild dyspnea that is chronic and unchanged.  She feels that her voice is getting a little raspy and it may be time for thoracentesis.  Anxiety/depression: She denies symptoms.   Hypoglycemia: Notes this is not as bad as it was previously.  She did see endocrinology and they are just planning on watching this.  She does not have low sugars nearly as frequently.  Postmenopausal bleeding: Patient went over a year without a menstrual cycle though that may have been related to her chronic illness and stress.  She was previously referred to gynecology though she was unable to see them given lack of insurance.  She does note consistent menstrual cycles of her last several months.   ROS: See pertinent positives and negatives per HPI.  Past Medical History:  Diagnosis Date  . Alcoholism ([place])   . Anal fissure    ?  Marland Kitchen Anastomotic ulcer, acute   . Anxiety   . Blood in stool   . Cirrhosis ([place])   . Confusion   . Depression   . Heart murmur   . History of pneumothorax 04/17/2017  . Hypertension   . Hypoalbuminemia   . Portal hypertension ([place])   . SBP (spontaneous bacterial peritonitis) ([place]) 05/12/2016  . UTI (lower urinary tract infection)     Past Surgical History:  Procedure Laterality Date  . CESAREAN SECTION  1989  . CHOLECYSTECTOMY    . GASTRIC BYPASS  2009  . IR GENERIC HISTORICAL  06/02/2016   IR TRANSCATHETER BX 06/02/2016 Marybelle Killings, MD [facility]-INTERV RAD    Family History  Problem Relation Age of Onset  . Alcoholism Maternal Grandfather   . Stroke Maternal Grandfather   . Arthritis Mother   . Hyperlipidemia Mother   . Hypertension Mother   . Stroke Mother   . Lymphoma Mother   . Heart  failure Mother   . COPD Mother   . Diabetes Mother   . Breast cancer Cousin   . Heart disease Father        CHF  . Diabetes Father   . Arthritis Father   . Factor V Leiden deficiency Father   . Hyperlipidemia Father   . Hypertension Father   . COPD Father   . Sudden death Paternal Uncle   . Lung cancer Maternal Grandmother     SOCIAL HX: non-smoker   Current Outpatient Medications:  .  ACCU-CHEK FASTCLIX LANCETS MISC, Check once daily, E16.2, Disp: 102  each, Rfl: 3 .  Ascorbic Acid (VITAMIN C IMMUNE HEALTH PO), Take by mouth., Disp: , Rfl:  .  blood glucose meter kit and supplies KIT, Check once daily if feeling lightheaded. E16.2., Disp: 1 each, Rfl: 0 .  Cyanocobalamin (VITAMIN B-12 CR PO), Take by mouth., Disp: , Rfl:  .  ferrous sulfate 325 (65 FE) MG tablet, Take 325 mg by mouth., Disp: , Rfl:  .  folic acid (FOLVITE) 1 MG tablet, TAKE 1 TABLET BY MOUTH  DAILY, Disp: 90 tablet, Rfl: 0 .  furosemide (LASIX) 20 MG tablet, Take 3 tablets (60 mg total) by mouth 2 (two) times daily., Disp: 180 tablet, Rfl: 0 .  gabapentin (NEURONTIN) 100 MG capsule, Take 200 mg by mouth at bedtime., Disp: , Rfl: 3 .  glucose blood test strip, accu chek once daily, E16.2, Disp: 100 each, Rfl: 12 .  Lactulose 20 GM/30ML SOLN, Take 30 mLs (20 g total) by mouth daily., Disp: 30 mL, Rfl: 3 .  ondansetron (ZOFRAN) 4 MG tablet, Take 1 tablet (4 mg total) by mouth 2 (two) times daily as needed for nausea or vomiting. (Patient taking differently: Take 8 mg by mouth 2 (two) times daily as needed for nausea or vomiting. ), Disp: 20 tablet, Rfl: 0 .  rifaximin (XIFAXAN) 550 MG TABS tablet, Take 550 mg by mouth., Disp: , Rfl:  .  spironolactone (ALDACTONE) 100 MG tablet, Take 1 tablet (100 mg total) by mouth 2 (two) times daily., Disp: 180 tablet, Rfl: 0 .  ciprofloxacin (CIPRO) 500 MG tablet, Take 1 tablet (500 mg total) by mouth daily. (Patient not taking: Reported on 04/10/2019), Disp: 180 tablet, Rfl: 0 .  docusate sodium (COLACE) 100 MG capsule, Take 100 mg by mouth 2 (two) times daily., Disp: , Rfl:  .  traMADol (ULTRAM) 50 MG tablet, Take 1 tablet (50 mg total) by mouth every 8 (eight) hours as needed for moderate pain., Disp: 20 tablet, Rfl: 0  Current Facility-Administered Medications:  .  0.9 %  sodium chloride infusion, 500 mL, Intravenous, Continuous, Pyrtle, Lajuan Lines, MD .  albumin human 25 % solution 50 g, 50 g, Intravenous, UD, Pyrtle, Lajuan Lines, MD  EXAM: This was  a telehealth telephone visit and thus no physical exam was completed.  ASSESSMENT AND PLAN:  Discussed the following assessment and plan:  Alcoholic cirrhosis ([place]) Stable.  She knows the importance of consistently taking her medications.  She will continue to follow with GI for this.  I have advised that she should request refills from them.  Hydrothorax She will contact her pulmonologist to see if they can schedule a thoracentesis.  Hypoglycemia Improved.  She will continue to monitor.  She will continue to follow with endocrinology.  Anxiety and depression Asymptomatic.  She will monitor for recurrence.  Postmenopausal bleeding Patient potentially with postmenopausal bleeding though her gap in menstrual cycles did  occur when she was quite ill and there was significant stress mentally and physically.  That could account for lack of menses though I did discuss that she does need to see GYN to evaluate for a specific cause.  She will contact them to schedule an appointment.   Social distancing precautions and sick precautions given regarding COVID-19.   I discussed the assessment and treatment plan with the patient. The patient was provided an opportunity to ask questions and all were answered. The patient agreed with the plan and demonstrated an understanding of the instructions.   The patient was advised to call back or seek an in-person evaluation if the symptoms worsen or if the condition fails to improve as anticipated.  I provided 18 minutes of non-face-to-face time during this encounter.   [name], MD

## 2019-04-11 NOTE — Assessment & Plan Note (Signed)
Improved.  She will continue to monitor.  She will continue to follow with endocrinology.

## 2019-04-11 NOTE — Assessment & Plan Note (Addendum)
Stable.  She knows the importance of consistently taking her medications.  She will continue to follow with GI for this.  I have advised that she should request refills from them.

## 2019-04-11 NOTE — Assessment & Plan Note (Signed)
She will contact her pulmonologist to see if they can schedule a thoracentesis.

## 2019-04-11 NOTE — Assessment & Plan Note (Signed)
Patient potentially with postmenopausal bleeding though her gap in menstrual cycles did occur when she was quite ill and there was significant stress mentally and physically.  That could account for lack of menses though I did discuss that she does need to see GYN to evaluate for a specific cause.  She will contact them to schedule an appointment.

## 2019-04-11 NOTE — Assessment & Plan Note (Signed)
Asymptomatic.  She will monitor for recurrence. 

## 2019-05-16 MED ORDER — ONDANSETRON HCL 4 MG TABLET
ORAL_TABLET | Freq: Three times a day (TID) | ORAL | 6 refills | 7 days | Status: CP | PRN
Start: 2019-05-16 — End: ?

## 2019-07-08 DIAGNOSIS — J918 Pleural effusion in other conditions classified elsewhere: Principal | ICD-10-CM

## 2019-07-08 DIAGNOSIS — K769 Liver disease, unspecified: Principal | ICD-10-CM

## 2019-07-15 ENCOUNTER — Ambulatory Visit
Admit: 2019-07-15 | Discharge: 2019-07-16 | Payer: MEDICARE | Attending: Critical Care Medicine | Primary: Critical Care Medicine

## 2019-07-17 ENCOUNTER — Ambulatory Visit: Admit: 2019-07-17 | Discharge: 2019-07-17 | Payer: MEDICARE

## 2019-07-17 DIAGNOSIS — R0602 Shortness of breath: Principal | ICD-10-CM

## 2019-07-17 DIAGNOSIS — J948 Other specified pleural conditions: Principal | ICD-10-CM

## 2019-07-17 DIAGNOSIS — K7031 Alcoholic cirrhosis of liver with ascites: Principal | ICD-10-CM

## 2019-07-17 MED ORDER — OXYCODONE-ACETAMINOPHEN 5 MG-325 MG TABLET: 1 | tablet | Freq: Four times a day (QID) | 0 refills | 3 days | Status: AC

## 2019-10-14 ENCOUNTER — Encounter: Payer: Self-pay | Admitting: Family Medicine

## 2019-10-14 ENCOUNTER — Ambulatory Visit (INDEPENDENT_AMBULATORY_CARE_PROVIDER_SITE_OTHER): Payer: Medicare Other | Admitting: Family Medicine

## 2019-10-14 ENCOUNTER — Other Ambulatory Visit: Payer: Self-pay

## 2019-10-14 VITALS — Ht 65.0 in | Wt 201.0 lb

## 2019-10-14 DIAGNOSIS — K7031 Alcoholic cirrhosis of liver with ascites: Secondary | ICD-10-CM

## 2019-10-14 DIAGNOSIS — N950 Postmenopausal bleeding: Secondary | ICD-10-CM

## 2019-10-14 DIAGNOSIS — E162 Hypoglycemia, unspecified: Secondary | ICD-10-CM | POA: Diagnosis not present

## 2019-10-14 DIAGNOSIS — Z1231 Encounter for screening mammogram for malignant neoplasm of breast: Secondary | ICD-10-CM | POA: Diagnosis not present

## 2019-10-14 NOTE — Assessment & Plan Note (Addendum)
Only occasional symptoms.  She will continue to monitor.  I did asked that she discuss granting access to me for her [facility] records through care everywhere.

## 2019-10-14 NOTE — Assessment & Plan Note (Addendum)
She will remain on her spironolactone and Lasix.  Advised that she needs to take this as prescribed.  I encouraged her to contact her GI physician.  She needs to schedule follow-up with them and discuss whether or not a thoracentesis is needed.  She also should discuss her medications for preventing hepatic encephalopathy.  She will call them today.

## 2019-10-14 NOTE — Progress Notes (Signed)
Virtual Visit via video Note  This visit type was conducted due to national recommendations for restrictions regarding the COVID-19 pandemic (e.g. social distancing).  This format is felt to be most appropriate for this patient at this time.  All issues noted in this document were discussed and addressed.  No physical exam was performed (except for noted visual exam findings with Video Visits).   I connected with Julie Graham today at  2:45 PM EST by a video enabled telemedicine application and verified that I am speaking with the correct person using two identifiers. Location patient: home Location provider: work  Persons participating in the virtual visit: patient, provider  I discussed the limitations, risks, security and privacy concerns of performing an evaluation and management service by telephone and the availability of in person appointments. I also discussed with the patient that there may be a patient responsible charge related to this service. The patient expressed understanding and agreed to proceed.   Reason for visit: follow-up  HPI: Alcoholic cirrhosis: Patient notes she feels as though she is accumulating fluid in her lung again.  She has not seen GI in a while and she has been trying to hold off on having a thoracentesis as long as possible relating to the discomfort that she has after having her thoracentesis each time and she is trying to avoid the hospital.  She is a little more winded than she typically was.  She did not take her spironolactone or Lasix appropriately through the holidays though over the last week she started taking them as prescribed.  She continues on lactulose and Xifaxan though is only taking 1 pill a day of Xifaxan given cost concerns.  She notes a little mental cloudiness over the last week or so though does not appear confused.  She is no longer on prophylactic ciprofloxacin.  She knows she needs to call to schedule an appointment with  GI.  Hypoglycemia: She reports she saw an endocrinologist.  She notes they advised her that dropping glucose is something that could occur somewhat commonly.  The patient notes she did fairly well for a while with only occasional issues.  A week or so ago her sugar did drop to about 44 and she ate something and improved.  I am unable to review care everywhere given that I do not have access to her [facility] chart.  Postmenopausal bleeding: She notes she has not had any additional bleeding.  She did not see GYN given that she did not have appropriate insurance.  It has now been quite sometime since her last menstrual cycle.  She reports she was scheduled for colonoscopy through [facility] though when Covid struck that was delayed.  She is due for mammogram.   ROS: See pertinent positives and negatives per HPI.  Past Medical History:  Diagnosis Date  . Alcoholism ([place])   . Anal fissure    ?  Marland Kitchen Anastomotic ulcer, acute   . Anxiety   . Blood in stool   . Cirrhosis ([place])   . Confusion   . Depression   . Heart murmur   . History of pneumothorax 04/17/2017  . Hypertension   . Hypoalbuminemia   . Portal hypertension ([place])   . SBP (spontaneous bacterial peritonitis) ([place]) 05/12/2016  . UTI (lower urinary tract infection)     Past Surgical History:  Procedure Laterality Date  . CESAREAN SECTION  1989  . CHOLECYSTECTOMY    . GASTRIC BYPASS  2009  . IR GENERIC HISTORICAL  06/02/2016   IR TRANSCATHETER BX 06/02/2016 Marybelle Killings, MD [facility]-INTERV RAD    Family History  Problem Relation Age of Onset  . Alcoholism Maternal Grandfather   . Stroke Maternal Grandfather   . Arthritis Mother   . Hyperlipidemia Mother   . Hypertension Mother   . Stroke Mother   . Lymphoma Mother   . Heart failure Mother   . COPD Mother   . Diabetes Mother   . Breast cancer Cousin   . Heart disease Father        CHF  . Diabetes Father   . Arthritis Father   . Factor V Leiden deficiency Father   . Hyperlipidemia Father    . Hypertension Father   . COPD Father   . Sudden death Paternal Uncle   . Lung cancer Maternal Grandmother     SOCIAL HX: Non-smoker.   Current Outpatient Medications:  .  ACCU-CHEK FASTCLIX LANCETS MISC, Check once daily, E16.2, Disp: 102 each, Rfl: 3 .  Ascorbic Acid (VITAMIN C IMMUNE HEALTH PO), Take by mouth., Disp: , Rfl:  .  blood glucose meter kit and supplies KIT, Check once daily if feeling lightheaded. E16.2., Disp: 1 each, Rfl: 0 .  Cyanocobalamin (VITAMIN B-12 CR PO), Take by mouth., Disp: , Rfl:  .  docusate sodium (COLACE) 100 MG capsule, Take 100 mg by mouth 2 (two) times daily., Disp: , Rfl:  .  ferrous sulfate 325 (65 FE) MG tablet, Take 325 mg by mouth., Disp: , Rfl:  .  folic acid (FOLVITE) 1 MG tablet, TAKE 1 TABLET BY MOUTH  DAILY, Disp: 90 tablet, Rfl: 0 .  furosemide (LASIX) 20 MG tablet, Take 3 tablets (60 mg total) by mouth 2 (two) times daily., Disp: 180 tablet, Rfl: 0 .  gabapentin (NEURONTIN) 100 MG capsule, Take 200 mg by mouth at bedtime., Disp: , Rfl: 3 .  glucose blood test strip, accu chek once daily, E16.2, Disp: 100 each, Rfl: 12 .  Lactulose 20 GM/30ML SOLN, Take 30 mLs (20 g total) by mouth daily., Disp: 30 mL, Rfl: 3 .  ondansetron (ZOFRAN) 4 MG tablet, Take 1 tablet (4 mg total) by mouth 2 (two) times daily as needed for nausea or vomiting. (Patient taking differently: Take 8 mg by mouth 2 (two) times daily as needed for nausea or vomiting. ), Disp: 20 tablet, Rfl: 0 .  rifaximin (XIFAXAN) 550 MG TABS tablet, Take 550 mg by mouth., Disp: , Rfl:  .  spironolactone (ALDACTONE) 100 MG tablet, Take 1 tablet (100 mg total) by mouth 2 (two) times daily., Disp: 180 tablet, Rfl: 0 .  traMADol (ULTRAM) 50 MG tablet, Take 1 tablet (50 mg total) by mouth every 8 (eight) hours as needed for moderate pain., Disp: 20 tablet, Rfl: 0  Current Facility-Administered Medications:  .  0.9 %  sodium chloride infusion, 500 mL, Intravenous, Continuous, Pyrtle, Lajuan Lines,  MD .  albumin human 25 % solution 50 g, 50 g, Intravenous, UD, Pyrtle, Lajuan Lines, MD  EXAM:  VITALS per patient if applicable:  GENERAL: alert, oriented, appears well and in no acute distress  HEENT: atraumatic, conjunttiva clear, no obvious abnormalities on inspection of external nose and ears  NECK: normal movements of the head and neck  LUNGS: on inspection no signs of respiratory distress, breathing rate appears normal, no obvious gross SOB, gasping or wheezing  CV: no obvious cyanosis  MS: moves all visible extremities without noticeable abnormality  PSYCH/NEURO: pleasant and cooperative, no obvious  depression or anxiety, speech and thought processing grossly intact  ASSESSMENT AND PLAN:  Discussed the following assessment and plan:  Alcoholic cirrhosis ([place]) She will remain on her spironolactone and Lasix.  Advised that she needs to take this as prescribed.  I encouraged her to contact her GI physician.  She needs to schedule follow-up with them and discuss whether or not a thoracentesis is needed.  She also should discuss her medications for preventing hepatic encephalopathy.  She will call them today.  Hypoglycemia Only occasional symptoms.  She will continue to monitor.  I did asked that she discuss granting access to me for her [facility] records through care everywhere.  Postmenopausal bleeding Refer back to GYN.  Discussed the importance of seeing them.   Orders Placed This Encounter  Procedures  . MM 3D SCREEN BREAST BILATERAL    Standing Status:   Future    Standing Expiration Date:   12/11/2020    Order Specific Question:   Reason for Exam (SYMPTOM  OR DIAGNOSIS REQUIRED)    Answer:   breast cancer screening    Order Specific Question:   Is the patient pregnant?    Answer:   No    Order Specific Question:   Preferred imaging location?    Answer:   [facility]  . Ambulatory referral to Gynecology    Referral Priority:   Routine    Referral Type:   Consultation     Referral Reason:   Specialty Services Required    Requested Specialty:   Gynecology    Number of Visits Requested:   1    No orders of the defined types were placed in this encounter.  Health maintenance: Discussed that she needs to contact GI at [facility] for colonoscopy.  Mammogram ordered.  She knows to call to schedule.   I discussed the assessment and treatment plan with the patient. The patient was provided an opportunity to ask questions and all were answered. The patient agreed with the plan and demonstrated an understanding of the instructions.   The patient was advised to call back or seek an in-person evaluation if the symptoms worsen or if the condition fails to improve as anticipated.    [name], MD

## 2019-10-16 ENCOUNTER — Telehealth: Payer: Self-pay | Admitting: Family Medicine

## 2019-10-16 NOTE — Telephone Encounter (Signed)
Pt dropped off long term disability paperwork. She said that he feels this out every year  Placed in Dr. Kermit Balo color folder upfront  Fax to [phone]

## 2019-10-16 NOTE — Telephone Encounter (Signed)
Picked up disability forms from up front and placed in sign basket for you to review and advise about.  Deniah Saia,cma

## 2019-10-16 NOTE — Assessment & Plan Note (Signed)
Refer back to GYN.  Discussed the importance of seeing them.

## 2019-10-17 NOTE — Telephone Encounter (Signed)
It looks like these forms were due on August 26, 2019.  I am happy to fill them out though I would suggest she contact the disability company and see if they need them still or if they have different forms that would need to be filled out.

## 2019-10-17 NOTE — Telephone Encounter (Signed)
I called and spoke with the patient and the patient states she does need them and she will call the insurance company but she wants you to go ahead and fill them out.  Barbee Mamula,cma

## 2019-10-18 DIAGNOSIS — Z0279 Encounter for issue of other medical certificate: Secondary | ICD-10-CM

## 2019-10-18 NOTE — Telephone Encounter (Signed)
Form completed.  Please make available for the patient to pick up.

## 2019-10-22 ENCOUNTER — Encounter: Payer: Self-pay | Admitting: Family Medicine

## 2019-10-23 ENCOUNTER — Encounter: Payer: Self-pay | Admitting: Family Medicine

## 2019-10-31 NOTE — Telephone Encounter (Signed)
The form was faxed to [facility] on 10/22/2019 and I called to see if the patient wanted to pick them up and there is no voicemail set up.  Anae Hams,cma

## 2019-12-09 DIAGNOSIS — K7030 Alcoholic cirrhosis of liver without ascites: Principal | ICD-10-CM

## 2019-12-11 MED ORDER — FUROSEMIDE 40 MG TABLET
ORAL_TABLET | 3 refills | 0 days | Status: CP
Start: 2019-12-11 — End: ?

## 2019-12-25 DIAGNOSIS — G629 Polyneuropathy, unspecified: Principal | ICD-10-CM

## 2019-12-26 MED ORDER — GABAPENTIN 100 MG CAPSULE
ORAL_CAPSULE | 3 refills | 0 days | Status: CP
Start: 2019-12-26 — End: ?

## 2020-01-03 ENCOUNTER — Telehealth: Admit: 2020-01-03 | Discharge: 2020-01-04 | Payer: MEDICARE

## 2020-01-03 DIAGNOSIS — K7030 Alcoholic cirrhosis of liver without ascites: Principal | ICD-10-CM

## 2020-01-03 MED ORDER — XIFAXAN 550 MG TABLET
ORAL_TABLET | Freq: Two times a day (BID) | ORAL | 4 refills | 90 days | Status: CP
Start: 2020-01-03 — End: ?

## 2020-01-07 DIAGNOSIS — K7031 Alcoholic cirrhosis of liver with ascites: Principal | ICD-10-CM

## 2020-02-06 ENCOUNTER — Telehealth: Payer: Self-pay | Admitting: Family Medicine

## 2020-02-06 NOTE — Telephone Encounter (Signed)
No answer/ VM full. Pt due to schedule Medicare Annual Wellness Visit (AWV) either virtually or audio only.  No hx of AWV; please schedule at anytime with [name] O'Brien-Blaney at [facility]  Pt started Part B [facility] 12/26/18

## 2020-03-16 ENCOUNTER — Ambulatory Visit: Admit: 2020-03-16 | Discharge: 2020-03-17 | Payer: MEDICARE

## 2020-03-16 DIAGNOSIS — K7030 Alcoholic cirrhosis of liver without ascites: Principal | ICD-10-CM

## 2020-04-16 ENCOUNTER — Ambulatory Visit: Admit: 2020-04-16 | Discharge: 2020-04-17 | Payer: MEDICARE

## 2020-04-16 DIAGNOSIS — J90 Pleural effusion, not elsewhere classified: Principal | ICD-10-CM

## 2020-04-16 DIAGNOSIS — K7030 Alcoholic cirrhosis of liver without ascites: Principal | ICD-10-CM

## 2020-04-16 DIAGNOSIS — R110 Nausea: Principal | ICD-10-CM

## 2020-04-16 DIAGNOSIS — Z1211 Encounter for screening for malignant neoplasm of colon: Principal | ICD-10-CM

## 2020-04-16 MED ORDER — ONDANSETRON 4 MG DISINTEGRATING TABLET
ORAL_TABLET | Freq: Three times a day (TID) | ORAL | 1 refills | 7 days | Status: CP | PRN
Start: 2020-04-16 — End: ?

## 2020-04-27 MED ORDER — PEG 3350-ELECTROLYTES 236 GRAM-22.74 GRAM-6.74 GRAM-5.86 GRAM SOLUTION
0 refills | 0 days | Status: CP
Start: 2020-04-27 — End: ?

## 2020-04-29 MED FILL — PEG 3350-ELECTROLYTES 236 GRAM-22.74 GRAM-6.74 GRAM-5.86 GRAM SOLUTION: 1 days supply | Qty: 4000 | Fill #0 | Status: AC

## 2020-04-29 MED FILL — PEG 3350-ELECTROLYTES 236 GRAM-22.74 GRAM-6.74 GRAM-5.86 GRAM SOLUTION: 1 days supply | Qty: 4000 | Fill #0

## 2020-05-04 ENCOUNTER — Ambulatory Visit: Admit: 2020-05-04 | Discharge: 2020-05-04 | Payer: MEDICARE

## 2020-05-04 DIAGNOSIS — J90 Pleural effusion, not elsewhere classified: Principal | ICD-10-CM

## 2020-05-04 DIAGNOSIS — K7030 Alcoholic cirrhosis of liver without ascites: Principal | ICD-10-CM

## 2020-06-23 ENCOUNTER — Encounter
Admit: 2020-06-23 | Discharge: 2020-06-27 | Payer: MEDICARE | Attending: Certified Registered" | Primary: Certified Registered"

## 2020-06-23 ENCOUNTER — Ambulatory Visit: Admit: 2020-06-23 | Discharge: 2020-06-27 | Payer: MEDICARE

## 2020-08-05 ENCOUNTER — Telehealth: Payer: Self-pay | Admitting: Family Medicine

## 2020-08-05 NOTE — Telephone Encounter (Signed)
MAILBOX FULL. Pt due to schedule Medicare Annual Wellness Visit (AWV)   This should be a telephone visit only=30 minutes.  No hx of AWV; please schedule at anytime with Julie Graham at [facility]

## 2020-08-10 ENCOUNTER — Ambulatory Visit (INDEPENDENT_AMBULATORY_CARE_PROVIDER_SITE_OTHER): Payer: Medicare Other

## 2020-08-10 VITALS — Ht 65.0 in | Wt 201.0 lb

## 2020-08-10 DIAGNOSIS — Z0000 Encounter for general adult medical examination without abnormal findings: Secondary | ICD-10-CM | POA: Diagnosis not present

## 2020-08-10 NOTE — Progress Notes (Signed)
Subjective:   Julie Graham is a 54 y.o. female who presents for an Initial Medicare Annual Wellness Visit.  Review of Systems    No ROS.  Medicare Wellness Virtual Visit.          Objective:    Today's Vitals   08/10/20 1337  Weight: 201 lb (91.2 kg)  Height: $Remove'5\' 5"'wxgFwWH$  (1.651 m)   Body mass index is 33.45 kg/m.  Advanced Directives 08/10/2020 06/14/2016 06/08/2016 06/08/2016 06/02/2016 04/26/2016 04/25/2016  Does Patient Have a Medical Advance Directive? No No No No No No No  Would patient like information on creating a medical advance directive? No - Patient declined No - patient declined information No - patient declined information No - patient declined information Yes - Educational materials given No - patient declined information -    Current Medications (verified) Outpatient Encounter Medications as of 08/10/2020  Medication Sig  . ACCU-CHEK FASTCLIX LANCETS MISC Check once daily, E16.2  . Ascorbic Acid (VITAMIN C IMMUNE HEALTH PO) Take by mouth.  . blood glucose meter kit and supplies KIT Check once daily if feeling lightheaded. E16.2.  . Cyanocobalamin (VITAMIN B-12 CR PO) Take by mouth.  . docusate sodium (COLACE) 100 MG capsule Take 100 mg by mouth 2 (two) times daily.  . ferrous sulfate 325 (65 FE) MG tablet Take 325 mg by mouth.  . folic acid (FOLVITE) 1 MG tablet TAKE 1 TABLET BY MOUTH  DAILY  . furosemide (LASIX) 20 MG tablet Take 3 tablets (60 mg total) by mouth 2 (two) times daily.  Marland Kitchen gabapentin (NEURONTIN) 100 MG capsule Take 200 mg by mouth at bedtime.  Marland Kitchen glucose blood test strip accu chek once daily, E16.2  . Lactulose 20 GM/30ML SOLN Take 30 mLs (20 g total) by mouth daily.  . ondansetron (ZOFRAN) 4 MG tablet Take 1 tablet (4 mg total) by mouth 2 (two) times daily as needed for nausea or vomiting. (Patient taking differently: Take 8 mg by mouth 2 (two) times daily as needed for nausea or vomiting. )  . rifaximin (XIFAXAN) 550 MG TABS tablet Take 550 mg by mouth.    . spironolactone (ALDACTONE) 100 MG tablet Take 1 tablet (100 mg total) by mouth 2 (two) times daily.  . traMADol (ULTRAM) 50 MG tablet Take 1 tablet (50 mg total) by mouth every 8 (eight) hours as needed for moderate pain.   Facility-Administered Encounter Medications as of 08/10/2020  Medication  . 0.9 %  sodium chloride infusion  . albumin human 25 % solution 50 g    Allergies (verified) Duloxetine and Sulfa antibiotics   History: Past Medical History:  Diagnosis Date  . Alcoholism ([place])   . Anal fissure    ?  Marland Kitchen Anastomotic ulcer, acute   . Anxiety   . Blood in stool   . Cirrhosis ([place])   . Confusion   . Depression   . Heart murmur   . History of pneumothorax 04/17/2017  . Hypertension   . Hypoalbuminemia   . Portal hypertension ([place])   . SBP (spontaneous bacterial peritonitis) ([place]) 05/12/2016  . UTI (lower urinary tract infection)    Past Surgical History:  Procedure Laterality Date  . CESAREAN SECTION  1989  . CHOLECYSTECTOMY    . GASTRIC BYPASS  2009  . IR GENERIC HISTORICAL  06/02/2016   IR TRANSCATHETER BX 06/02/2016 Marybelle Killings, MD [facility]-INTERV RAD   Family History  Problem Relation Age of Onset  . Alcoholism Maternal Grandfather   .  Stroke Maternal Grandfather   . Arthritis Mother   . Hyperlipidemia Mother   . Hypertension Mother   . Stroke Mother   . Lymphoma Mother   . Heart failure Mother   . COPD Mother   . Diabetes Mother   . Breast cancer Cousin   . Heart disease Father        CHF  . Diabetes Father   . Arthritis Father   . Factor V Leiden deficiency Father   . Hyperlipidemia Father   . Hypertension Father   . COPD Father   . Sudden death Paternal Uncle   . Lung cancer Maternal Grandmother    Social History   Socioeconomic History  . Marital status: Divorced    Spouse name: Not on file  . Number of children: 1   . Years of education: Not on file  . Highest education level: Not on file  Occupational History  . Occupation: accounts  Radiographer, therapeutic: [employer]  Tobacco Use  . Smoking status: Never Smoker  . Smokeless tobacco: Never Used  Substance and Sexual Activity  . Alcohol use: No    Alcohol/week: 0.0 standard drinks    Comment: stopped drinking 02/16/16  . Drug use: No  . Sexual activity: Not on file  Other Topics Concern  . Not on file  Social History Narrative   1 biological son and 1 adopted son   Social Determinants of Health   Financial Resource Strain: Low Risk   . Difficulty of Paying Living Expenses: Not hard at all  Food Insecurity: No Food Insecurity  . Worried About Charity fundraiser in the Last Year: Never true  . Ran Out of Food in the Last Year: Never true  Transportation Needs: No Transportation Needs  . Lack of Transportation (Medical): No  . Lack of Transportation (Non-Medical): No  Physical Activity:   . Days of Exercise per Week: Not on file  . Minutes of Exercise per Session: Not on file  Stress: No Stress Concern Present  . Feeling of Stress : Not at all  Social Connections: Unknown  . Frequency of Communication with Friends and Family: Not on file  . Frequency of Social Gatherings with Friends and Family: More than three times a week  . Attends Religious Services: Not on file  . Active Member of Clubs or Organizations: Not on file  . Attends Archivist Meetings: Not on file  . Marital Status: Not on file    Tobacco Counseling Counseling given: Not Answered   Clinical Intake:  Pre-visit preparation completed: Yes        Diabetes: No  How often do you need to have someone help you when you read instructions, pamphlets, or other written materials from your doctor or pharmacy?: 1 - NeverInterpreter Needed?: No    Activities of Daily Living In your present state of health, do you have any difficulty performing the following activities: 08/10/2020  Hearing? N  Vision? N  Difficulty concentrating or making decisions? Y  Comment Difficulty  remembering  Walking or climbing stairs? N  Dressing or bathing? N  Doing errands, shopping? N  Preparing Food and eating ? N  Using the Toilet? N  In the past six months, have you accidently leaked urine? N  Do you have problems with loss of bowel control? N  Managing your Medications? N  Managing your Finances? N  Housekeeping or managing your Housekeeping? N  Some recent data might be hidden  Patient Care Team: Leone Haven, MD as PCP - General (Family Medicine)  Indicate any recent Medical Services you may have received from other than [facility] providers in the past year (date may be approximate).     Assessment:   This is a routine wellness examination for Der.  I connected with Shalawn today by telephone and verified that I am speaking with the correct person using two identifiers. Location patient: home Location provider: work Persons participating in the virtual visit: patient, Marine scientist.    I discussed the limitations, risks, security and privacy concerns of performing an evaluation and management service by telephone and the availability of in person appointments. The patient expressed understanding and verbally consented to this telephonic visit.    Interactive audio and video telecommunications were attempted between this provider and patient, however failed, due to patient having technical difficulties OR patient did not have access to video capability.  We continued and completed visit with audio only.  Some vital signs may be absent or patient reported.   Hearing/Vision screen  Hearing Screening   '125Hz'$  $Remo'250Hz'uEJmh$'500Hz'$'1000Hz'$'2000Hz'$'3000Hz'$'4000Hz'$'6000Hz'$'8000Hz'$   Right ear:           Left ear:           Comments: Patient is able to hear conversational tones without difficulty.  No issues reported.  Vision Screening Comments: Visual acuity not assessed, virtual visit.     Dietary issues and exercise activities discussed: Current Exercise Habits: Home exercise  routine, Intensity: Mild Healthy diet Good water intake   Goals    . Follow up with Primary Care Provider     As needed      Depression Screen PHQ 2/9 Scores 08/10/2020 10/14/2019 04/06/2016 02/10/2016  PHQ - 2 Score 0 0 4 2  PHQ- 9 Score - - 15 7    Fall Risk Fall Risk  08/10/2020 10/14/2019  Falls in the past year? 0 0  Number falls in past yr: 0 0  Follow up Falls evaluation completed Falls evaluation completed   Handrails in use when climbing stairs? Yes Home free of loose throw rugs in walkways, pet beds, electrical cords, etc? Yes  Adequate lighting in your home to reduce risk of falls? Yes   ASSISTIVE DEVICES UTILIZED TO PREVENT FALLS: Life alert? No  Use of a cane, walker or w/c? No   TIMED UP AND GO: Was the test performed? No . Virtual visit.   Cognitive Function:  Patient is alert and oriented x3.  Denies difficulty making decisions, focusing. Notes some difficulty remembering due to long term disability.   Difficulty recall the months of the year in reverse.  6CIT deferred.    Immunizations Immunization History  Administered Date(s) Administered  . Influenza,inj,Quad PF,6+ Mos 07/11/2016, 10/11/2017, 07/19/2018  . Influenza-Unspecified 08/26/2016, 06/26/2018    TDAP status: Due, Education has been provided regarding the importance of this vaccine. Advised may receive this vaccine at local pharmacy or Health Dept. Aware to provide a copy of the vaccination record if obtained from local pharmacy or Health Dept. Verbalized acceptance and understanding.  Deferred.   Influenza vaccine- plans to receive later in the season.  Health Maintenance Health Maintenance  Topic Date Due  . HIV Screening  Never done  . PAP SMEAR-Modifier  Never done  . MAMMOGRAM  Never done  . INFLUENZA VACCINE  12/24/2020 (Originally 04/26/2020)  . TETANUS/TDAP  08/10/2021 (Originally 12/08/1986)  . COLONOSCOPY  06/23/2030  . Hepatitis C Screening  Completed  . COVID-19 Vaccine   Discontinued   Colorectal cancer screening: Completed 06/23/20. Repeat every 10 years.  Mammogram- ordered 10/14/19. Bilateral. Plans to schedule later in the year.   Bone Density- see care everywhere.  Pap smear- plans to reschedule later in the schedule.  Lung Cancer Screening: (Low Dose CT Chest recommended if Age 52-80 years, 30 pack-year currently smoking OR have quit w/in 15years.) does not qualify.    Hepatitis C Screening: Completed 02/24/16  Dental Screening: Recommended annual dental exams for proper oral hygiene.  Community Resource Referral / Chronic Care Management: CRR required this visit?  No   CCM required this visit?  No      Plan:   Keep all routine maintenance appointments.   Encouraged to schedule annual follow up.   I have personally reviewed and noted the following in the patient's chart:   . Medical and social history . Use of alcohol, tobacco or illicit drugs  . Current medications and supplements . Functional ability and status . Nutritional status . Physical activity . Advanced directives . List of other physicians . Hospitalizations, surgeries, and ER visits in previous 12 months . Vitals . Screenings to include cognitive, depression, and falls . Referrals and appointments  In addition, I have reviewed and discussed with patient certain preventive protocols, quality metrics, and best practice recommendations. A written personalized care plan for preventive services as well as general preventive health recommendations were provided to patient via mychart.     Varney Biles, LPN   [id number]

## 2020-08-10 NOTE — Patient Instructions (Addendum)
Julie Graham , Thank you for taking time to come for your Medicare Wellness Visit. I appreciate your ongoing commitment to your health goals. Please review the following plan we discussed and let me know if I can assist you in the future.   These are the goals we discussed: Goals     Follow up with Primary Care Provider     As needed       This is a list of the screening recommended for you and due dates:  Health Maintenance  Topic Date Due   HIV Screening  Never done   Pap Smear  Never done   Mammogram  Never done   Flu Shot  12/24/2020*   Tetanus Vaccine  08/10/2021*   Colon Cancer Screening  06/23/2030    Hepatitis C: One time screening is recommended by Center for Disease Control  (CDC) for  adults born from [birth year] through 1963.   Completed   COVID-19 Vaccine  Discontinued  *Topic was postponed. The date shown is not the original due date.    Immunizations Immunization History  Administered Date(s) Administered   Influenza,inj,Quad PF,6+ Mos 07/11/2016, 10/11/2017, 07/19/2018   Influenza-Unspecified 08/26/2016, 06/26/2018   Advanced directives: not yet completed  Follow up in one year for your annual wellness visit.   Preventive Care 40-64 Years, Female Preventive care refers to lifestyle choices and visits with your health care provider that can promote health and wellness. What does preventive care include?  A yearly physical exam. This is also called an annual well check.  Dental exams once or twice a year.  Routine eye exams. Ask your health care provider how often you should have your eyes checked.  Personal lifestyle choices, including:  Daily care of your teeth and gums.  Regular physical activity.  Eating a healthy diet.  Avoiding tobacco and drug use.  Limiting alcohol use.  Practicing safe sex.  Taking low-dose aspirin daily starting at age 2.  Taking vitamin and mineral supplements as recommended by your health care provider. What  happens during an annual well check? The services and screenings done by your health care provider during your annual well check will depend on your age, overall health, lifestyle risk factors, and family history of disease. Counseling  Your health care provider may ask you questions about your:  Alcohol use.  Tobacco use.  Drug use.  Emotional well-being.  Home and relationship well-being.  Sexual activity.  Eating habits.  Work and work Statistician.  Method of birth control.  Menstrual cycle.  Pregnancy history. Screening  You may have the following tests or measurements:  Height, weight, and BMI.  Blood pressure.  Lipid and cholesterol levels. These may be checked every 5 years, or more frequently if you are over 24 years old.  Skin check.  Lung cancer screening. You may have this screening every year starting at age 20 if you have a 30-pack-year history of smoking and currently smoke or have quit within the past 15 years.  Fecal occult blood test (FOBT) of the stool. You may have this test every year starting at age 40.  Flexible sigmoidoscopy or colonoscopy. You may have a sigmoidoscopy every 5 years or a colonoscopy every 10 years starting at age 15.  Hepatitis C blood test.  Hepatitis B blood test.  Sexually transmitted disease (STD) testing.  Diabetes screening. This is done by checking your blood sugar (glucose) after you have not eaten for a while (fasting). You may have this done every  1-3 years.  Mammogram. This may be done every 1-2 years. Talk to your health care provider about when you should start having regular mammograms. This may depend on whether you have a family history of breast cancer.  BRCA-related cancer screening. This may be done if you have a family history of breast, ovarian, tubal, or peritoneal cancers.  Pelvic exam and Pap test. This may be done every 3 years starting at age 47. Starting at age 47, this may be done every 5 years  if you have a Pap test in combination with an HPV test.  Bone density scan. This is done to screen for osteoporosis. You may have this scan if you are at high risk for osteoporosis. Discuss your test results, treatment options, and if necessary, the need for more tests with your health care provider. Vaccines  Your health care provider may recommend certain vaccines, such as:  Influenza vaccine. This is recommended every year.  Tetanus, diphtheria, and acellular pertussis (Tdap, Td) vaccine. You may need a Td booster every 10 years.  Zoster vaccine. You may need this after age 92.  Pneumococcal 13-valent conjugate (PCV13) vaccine. You may need this if you have certain conditions and were not previously vaccinated.  Pneumococcal polysaccharide (PPSV23) vaccine. You may need one or two doses if you smoke cigarettes or if you have certain conditions. Talk to your health care provider about which screenings and vaccines you need and how often you need them. This information is not intended to replace advice given to you by your health care provider. Make sure you discuss any questions you have with your health care provider. Document Released: 10/09/2015 Document Revised: 06/01/2016 Document Reviewed: 07/14/2015 Elsevier Interactive Patient Education  2017 [employer] Prevention in the Home Falls can cause injuries. They can happen to people of all ages. There are many things you can do to make your home safe and to help prevent falls. What can I do on the outside of my home?  Regularly fix the edges of walkways and driveways and fix any cracks.  Remove anything that might make you trip as you walk through a door, such as a raised step or threshold.  Trim any bushes or trees on the path to your home.  Use bright outdoor lighting.  Clear any walking paths of anything that might make someone trip, such as rocks or tools.  Regularly check to see if handrails are loose or  broken. Make sure that both sides of any steps have handrails.  Any raised decks and porches should have guardrails on the edges.  Have any leaves, snow, or ice cleared regularly.  Use sand or salt on walking paths during winter.  Clean up any spills in your garage right away. This includes oil or grease spills. What can I do in the bathroom?  Use night lights.  Install grab bars by the toilet and in the tub and shower. Do not use towel bars as grab bars.  Use non-skid mats or decals in the tub or shower.  If you need to sit down in the shower, use a plastic, non-slip stool.  Keep the floor dry. Clean up any water that spills on the floor as soon as it happens.  Remove soap buildup in the tub or shower regularly.  Attach bath mats securely with double-sided non-slip rug tape.  Do not have throw rugs and other things on the floor that can make you trip. What can I do  in the bedroom?  Use night lights.  Make sure that you have a light by your bed that is easy to reach.  Do not use any sheets or blankets that are too big for your bed. They should not hang down onto the floor.  Have a firm chair that has side arms. You can use this for support while you get dressed.  Do not have throw rugs and other things on the floor that can make you trip. What can I do in the kitchen?  Clean up any spills right away.  Avoid walking on wet floors.  Keep items that you use a lot in easy-to-reach places.  If you need to reach something above you, use a strong step stool that has a grab bar.  Keep electrical cords out of the way.  Do not use floor polish or wax that makes floors slippery. If you must use wax, use non-skid floor wax.  Do not have throw rugs and other things on the floor that can make you trip. What can I do with my stairs?  Do not leave any items on the stairs.  Make sure that there are handrails on both sides of the stairs and use them. Fix handrails that are  broken or loose. Make sure that handrails are as long as the stairways.  Check any carpeting to make sure that it is firmly attached to the stairs. Fix any carpet that is loose or worn.  Avoid having throw rugs at the top or bottom of the stairs. If you do have throw rugs, attach them to the floor with carpet tape.  Make sure that you have a light switch at the top of the stairs and the bottom of the stairs. If you do not have them, ask someone to add them for you. What else can I do to help prevent falls?  Wear shoes that:  Do not have high heels.  Have rubber bottoms.  Are comfortable and fit you well.  Are closed at the toe. Do not wear sandals.  If you use a stepladder:  Make sure that it is fully opened. Do not climb a closed stepladder.  Make sure that both sides of the stepladder are locked into place.  Ask someone to hold it for you, if possible.  Clearly mark and make sure that you can see:  Any grab bars or handrails.  First and last steps.  Where the edge of each step is.  Use tools that help you move around (mobility aids) if they are needed. These include:  Canes.  Walkers.  Scooters.  Crutches.  Turn on the lights when you go into a dark area. Replace any light bulbs as soon as they burn out.  Set up your furniture so you have a clear path. Avoid moving your furniture around.  If any of your floors are uneven, fix them.  If there are any pets around you, be aware of where they are.  Review your medicines with your doctor. Some medicines can make you feel dizzy. This can increase your chance of falling. Ask your doctor what other things that you can do to help prevent falls. This information is not intended to replace advice given to you by your health care provider. Make sure you discuss any questions you have with your health care provider. Document Released: 07/09/2009 Document Revised: 02/18/2016 Document Reviewed: 10/17/2014 Elsevier  Interactive Patient Education  2017 [employer].

## 2020-08-28 ENCOUNTER — Telehealth: Payer: Self-pay | Admitting: Family Medicine

## 2020-08-28 NOTE — Telephone Encounter (Signed)
Patient dropped off insurance papers for her disability to be completed by Dr. Birdie Sons. Paper work is up front in [employer]

## 2020-08-28 NOTE — Telephone Encounter (Signed)
I have not seen this patient in almost a year.  She will have to see me for a visit for me to accurately review and fill out her disability paperwork.

## 2020-08-31 NOTE — Telephone Encounter (Signed)
Tried to call patient but no answer and VM is full. If patient calls back she needs an appt to have forms filled out.

## 2020-09-01 NOTE — Telephone Encounter (Signed)
Patient made appt for 10/13/19 at 10:00 am to have forms filled out

## 2020-09-01 NOTE — Telephone Encounter (Signed)
I was able to move her appt up to 09/04/20 at 8:00 am

## 2020-09-01 NOTE — Telephone Encounter (Signed)
Noted. It looks like they want the forms by 12/14. Can she come in sooner than that?

## 2020-09-04 ENCOUNTER — Ambulatory Visit: Payer: Medicare Other | Admitting: Family Medicine

## 2020-09-07 ENCOUNTER — Other Ambulatory Visit: Payer: Self-pay

## 2020-09-07 ENCOUNTER — Encounter: Payer: Self-pay | Admitting: Family Medicine

## 2020-09-07 ENCOUNTER — Ambulatory Visit (INDEPENDENT_AMBULATORY_CARE_PROVIDER_SITE_OTHER): Payer: Medicare Other | Admitting: Family Medicine

## 2020-09-07 VITALS — BP 130/80 | HR 75 | Temp 98.2°F | Ht 65.0 in | Wt 218.0 lb

## 2020-09-07 DIAGNOSIS — R252 Cramp and spasm: Secondary | ICD-10-CM

## 2020-09-07 DIAGNOSIS — K7031 Alcoholic cirrhosis of liver with ascites: Secondary | ICD-10-CM

## 2020-09-07 NOTE — Progress Notes (Signed)
[name], MD Phone: [phone]  Julie Graham is a 54 y.o. female who presents today for f/u.   Alcoholic cirrhosis: the patient notes her symptoms have improved to a certain degree since our last visit about a year ago. She continues to follow with [facility] for this issue. She notes she does not take her lasix or spironolactone as prescribed and only takes one tablet per day. She notes she was told by [facility] to take them BID if she felt like she was accumulating fluid. She does take the rifaximin, though notes she has a hard time taking the lactulose as prescribed due to the taste. She notes her breathing has improved to a certain degree though she does still have intermittent issues with hydrothorax that requires periodic thoracentesis. She continues to have rib pain. Nausea is a chronic issue. Confusion comes and goes based on taking her medications. She does note persistent memory issues as well. She also reports significant cramps in her hands and legs at times. She will take extra magnesium and potassium when this occurs. Also reports chronic fatigue. She notes she is able to do light work around the house and stays on the go. She notes she pushes herself to stay active. She notes her prior asterixis is better.   Social History   Tobacco Use  Smoking Status Never Smoker  Smokeless Tobacco Never Used     ROS see history of present illness  Objective  Physical Exam Vitals:   09/07/20 1528  BP: 130/80  Pulse: 75  Temp: 98.2 F (36.8 C)  SpO2: 97%    BP Readings from Last 3 Encounters:  09/07/20 130/80  08/17/18 118/60  05/16/18 108/62   Wt Readings from Last 3 Encounters:  09/07/20 218 lb (98.9 kg)  08/10/20 201 lb (91.2 kg)  10/14/19 201 lb (91.2 kg)    Physical Exam Constitutional:      General: She is not in acute distress.    Appearance: She is not diaphoretic.  Cardiovascular:     Rate and Rhythm: Normal rate and regular rhythm.     Heart sounds: Normal heart  sounds.  Pulmonary:     Effort: Pulmonary effort is normal.     Comments: Minimally decreased breath sounds at her right lung base though otherwise good air movement Musculoskeletal:        General: No edema.  Skin:    General: Skin is warm and dry.  Neurological:     Mental Status: She is alert.     Comments: No asterixis      Assessment/Plan: Please see individual problem list.  Problem List Items Addressed This Visit    Alcoholic cirrhosis ([place]) - Primary    Overall improved since our last visit. She does still have some intermittent symptoms as outlined that do periodically worsen with regards to her breathing. She is able to do light work at home though her memory issues make it difficult to complete mental tasks. We will complete her disability paperwork. She will continue to see [facility]. I encouraged her to take her medications as prescribed. Check CMET.       Cramps, extremity    Check electrolytes and magnesium.       Relevant Orders   Comp Met (CMET) (Completed)   Magnesium (Completed)       This visit occurred during the SARS-CoV-2 public health emergency.  Safety protocols were in place, including screening questions prior to the visit, additional usage of staff PPE,  and extensive cleaning of exam room while observing appropriate contact time as indicated for disinfecting solutions.    [name], MD [place]

## 2020-09-07 NOTE — Patient Instructions (Signed)
Nice to see you. I will get your disability paperwork filled out.  We will contact you with your lab results.

## 2020-09-08 LAB — COMPREHENSIVE METABOLIC PANEL
ALT: 13 U/L (ref 0–35)
AST: 25 U/L (ref 0–37)
Albumin: 3.6 g/dL (ref 3.5–5.2)
Alkaline Phosphatase: 70 U/L (ref 39–117)
BUN: 18 mg/dL (ref 6–23)
CO2: 27 mEq/L (ref 19–32)
Calcium: 9.4 mg/dL (ref 8.4–10.5)
Chloride: 103 mEq/L (ref 96–112)
Creatinine, Ser: 0.87 mg/dL (ref 0.40–1.20)
GFR: 76.42 mL/min (ref >60.00)
Glucose, Bld: 72 mg/dL (ref 70–99)
Potassium: 4 mEq/L (ref 3.5–5.1)
Sodium: 138 mEq/L (ref 135–145)
Total Bilirubin: 0.9 mg/dL (ref 0.2–1.2)
Total Protein: 6.8 g/dL (ref 6.0–8.3)

## 2020-09-08 LAB — MAGNESIUM: Magnesium: 1.8 mg/dL (ref 1.5–2.5)

## 2020-09-08 NOTE — Assessment & Plan Note (Signed)
Check electrolytes and magnesium. 

## 2020-09-08 NOTE — Assessment & Plan Note (Signed)
Overall improved since our last visit. She does still have some intermittent symptoms as outlined that do periodically worsen with regards to her breathing. She is able to do light work at home though her memory issues make it difficult to complete mental tasks. We will complete her disability paperwork. She will continue to see [facility]. I encouraged her to take her medications as prescribed. Check CMET.

## 2020-09-16 ENCOUNTER — Ambulatory Visit: Payer: Medicare Other | Admitting: Family Medicine

## 2020-10-05 ENCOUNTER — Ambulatory Visit: Payer: Medicare Other | Admitting: Family Medicine

## 2020-10-05 NOTE — Telephone Encounter (Signed)
Patient called in about disability form she stated that she is coming to pick up the form in office

## 2020-10-06 NOTE — Telephone Encounter (Signed)
Patient called in wanted to pick up her disability form for [place]

## 2020-10-12 ENCOUNTER — Ambulatory Visit: Payer: Medicare Other | Admitting: Family Medicine

## 2020-10-16 ENCOUNTER — Telehealth: Payer: Self-pay | Admitting: Family Medicine

## 2020-10-16 NOTE — Telephone Encounter (Signed)
Patient called in about her disability  form coming to pick it up today

## 2021-01-27 DIAGNOSIS — K7031 Alcoholic cirrhosis of liver with ascites: Principal | ICD-10-CM

## 2021-03-08 ENCOUNTER — Ambulatory Visit: Payer: Medicare Other | Admitting: Family Medicine

## 2021-04-19 ENCOUNTER — Ambulatory Visit (INDEPENDENT_AMBULATORY_CARE_PROVIDER_SITE_OTHER): Payer: Medicare Other | Admitting: Family Medicine

## 2021-04-19 ENCOUNTER — Other Ambulatory Visit: Payer: Self-pay

## 2021-04-19 VITALS — BP 100/60 | HR 66 | Temp 98.6°F | Ht 65.0 in | Wt 208.2 lb

## 2021-04-19 DIAGNOSIS — K7031 Alcoholic cirrhosis of liver with ascites: Secondary | ICD-10-CM

## 2021-04-19 DIAGNOSIS — R5382 Chronic fatigue, unspecified: Secondary | ICD-10-CM

## 2021-04-19 DIAGNOSIS — R252 Cramp and spasm: Secondary | ICD-10-CM | POA: Diagnosis not present

## 2021-04-19 DIAGNOSIS — Z13220 Encounter for screening for lipoid disorders: Secondary | ICD-10-CM

## 2021-04-19 DIAGNOSIS — N950 Postmenopausal bleeding: Secondary | ICD-10-CM

## 2021-04-19 NOTE — Progress Notes (Signed)
[name], MD Phone: [phone]  Julie Graham is a 55 y.o. female who presents today for follow-up.  Alcoholic cirrhosis: Patient notes she has done relatively well.  She has not seen GI since our last visit in December.  She continues on Lasix and spironolactone.  She has not needed a thoracentesis since prior to our last visit.  She has chronic pain in her ribs posteriorly that is a nagging soreness.  Still gets mildly short of breath.  She reports she is due to see GI.  She does note chronic fatigue.  She denies depression.  Notes she is splitting up with her boyfriend and that has been stressful.  Chronic leg cramps: These are intermittent and are similar to prior.  She notes she was found to have low magnesium and potassium previously.  She is currently taking 250 mg of magnesium twice daily as well as an over-the-counter supplement of potassium 2 tablets daily.  Postmenopausal bleeding: Patient notes she continues to have occasional spotting.  She had no menstrual cycles at all while she was ill when she was first diagnosed with cirrhosis.  She has had intermittent spotting for some time now.  She knows she needs to see GI as she had an indeterminant endometrial biopsy previously though she has held off on contacting them.   Social History   Tobacco Use  Smoking Status Never  Smokeless Tobacco Never    Current Outpatient Medications on File Prior to Visit  Medication Sig Dispense Refill   ACCU-CHEK FASTCLIX LANCETS MISC Check once daily, E16.2 102 each 3   Ascorbic Acid (VITAMIN C IMMUNE HEALTH PO) Take by mouth.     blood glucose meter kit and supplies KIT Check once daily if feeling lightheaded. E16.2. 1 each 0   Cyanocobalamin (VITAMIN B-12 CR PO) Take by mouth.     docusate sodium (COLACE) 100 MG capsule Take 100 mg by mouth 2 (two) times daily.     ferrous sulfate 325 (65 FE) MG tablet Take 325 mg by mouth.     folic acid (FOLVITE) 1 MG tablet TAKE 1 TABLET BY MOUTH   DAILY 90 tablet 0   furosemide (LASIX) 20 MG tablet Take 3 tablets (60 mg total) by mouth 2 (two) times daily. 180 tablet 0   gabapentin (NEURONTIN) 100 MG capsule Take 200 mg by mouth at bedtime.  3   glucose blood test strip accu chek once daily, E16.2 100 each 12   Lactulose 20 GM/30ML SOLN Take 30 mLs (20 g total) by mouth daily. 30 mL 3   ondansetron (ZOFRAN) 4 MG tablet Take 1 tablet (4 mg total) by mouth 2 (two) times daily as needed for nausea or vomiting. (Patient taking differently: Take 8 mg by mouth 2 (two) times daily as needed for nausea or vomiting.) 20 tablet 0   rifaximin (XIFAXAN) 550 MG TABS tablet Take 550 mg by mouth.     spironolactone (ALDACTONE) 100 MG tablet Take 1 tablet (100 mg total) by mouth 2 (two) times daily. 180 tablet 0   traMADol (ULTRAM) 50 MG tablet Take 1 tablet (50 mg total) by mouth every 8 (eight) hours as needed for moderate pain. 20 tablet 0   Current Facility-Administered Medications on File Prior to Visit  Medication Dose Route Frequency Provider Last Rate Last Admin   0.9 %  sodium chloride infusion  500 mL Intravenous Continuous Pyrtle, Lajuan Lines, MD       albumin human 25 % solution 50 g  50 g Intravenous UD Pyrtle, Lajuan Lines, MD         ROS see history of present illness  Objective  Physical Exam Vitals:   04/19/21 1603  BP: 100/60  Pulse: 66  Temp: 98.6 F (37 C)  SpO2: 99%    BP Readings from Last 3 Encounters:  04/19/21 100/60  09/07/20 130/80  08/17/18 118/60   Wt Readings from Last 3 Encounters:  04/19/21 208 lb 3.2 oz (94.4 kg)  09/07/20 218 lb (98.9 kg)  08/10/20 201 lb (91.2 kg)    Physical Exam Constitutional:      General: She is not in acute distress.    Appearance: She is not diaphoretic.  Cardiovascular:     Rate and Rhythm: Normal rate and regular rhythm.     Heart sounds: Normal heart sounds.  Pulmonary:     Effort: Pulmonary effort is normal.     Comments: Very mildly decreased breath sounds at the very bottom  of her right lung field, otherwise her right lung field is clear to auscultation, left lung field is clear as well Skin:    General: Skin is warm and dry.  Neurological:     Mental Status: She is alert.     Assessment/Plan: Please see individual problem list.  Problem List Items Addressed This Visit     Alcoholic cirrhosis ([place])    She seems to be doing quite well.  She does not appear to have had much fluid accumulation since her last visit as she has not required a thoracentesis.  I have advised her to follow-up with GI.  She will continue Lasix and spironolactone.  She will contact us if GI is unable to refill these for her.       Cramps, extremity    Chronic issue.  We will check magnesium and electrolytes.  Discussed the potential for altering her over-the-counter supplementation regimen depending on her lab results.       Postmenopausal bleeding    The patient continues to have issues with this.  I discussed the importance of seeing GYN to help evaluate for cancer as a potential cause of her symptoms.  I offered to place another referral though she declined and stated she would contact them.  The patient understands the the importance of having this evaluated.       Other Visit Diagnoses     Chronic fatigue    -  Primary   Relevant Orders   Comp Met (CMET)   TSH   Lipid screening       Relevant Orders   Comp Met (CMET)   Lipid panel   Muscle cramps       Relevant Orders   Comp Met (CMET)   Magnesium        Health Maintenance: She was advised she could get the Shingrix vaccine at the pharmacy.  Return in about 6 months (around 10/20/2021).  This visit occurred during the SARS-CoV-2 public health emergency.  Safety protocols were in place, including screening questions prior to the visit, additional usage of staff PPE, and extensive cleaning of exam room while observing appropriate contact time as indicated for disinfecting solutions.    [name],  MD [place]

## 2021-04-19 NOTE — Assessment & Plan Note (Signed)
The patient continues to have issues with this.  I discussed the importance of seeing GYN to help evaluate for cancer as a potential cause of her symptoms.  I offered to place another referral though she declined and stated she would contact them.  The patient understands the the importance of having this evaluated.

## 2021-04-19 NOTE — Patient Instructions (Signed)
Nice to see you. Please call GYN to set up an appointment for your vaginal bleeding.  We will get lab work today and contact you with results. Please let me know if you need refills of the spironolactone and Lasix. Please call GI to schedule a visit as well.

## 2021-04-19 NOTE — Assessment & Plan Note (Signed)
Chronic issue.  We will check magnesium and electrolytes.  Discussed the potential for altering her over-the-counter supplementation regimen depending on her lab results.

## 2021-04-19 NOTE — Assessment & Plan Note (Signed)
She seems to be doing quite well.  She does not appear to have had much fluid accumulation since her last visit as she has not required a thoracentesis.  I have advised her to follow-up with GI.  She will continue Lasix and spironolactone.  She will contact us if GI is unable to refill these for her.

## 2021-04-20 LAB — LIPID PANEL
Cholesterol: 139 mg/dL (ref 0–200)
HDL: 46.2 mg/dL (ref >39.00)
LDL Cholesterol: 81 mg/dL (ref 0–99)
NonHDL: 92.76
Total CHOL/HDL Ratio: 3
Triglycerides: 60 mg/dL (ref 0.0–149.0)
VLDL: 12 mg/dL (ref 0.0–40.0)

## 2021-04-20 LAB — TSH: TSH: 0.36 u[IU]/mL (ref 0.35–5.50)

## 2021-04-20 LAB — COMPREHENSIVE METABOLIC PANEL
ALT: 13 U/L (ref 0–35)
AST: 31 U/L (ref 0–37)
Albumin: 3.6 g/dL (ref 3.5–5.2)
Alkaline Phosphatase: 65 U/L (ref 39–117)
BUN: 11 mg/dL (ref 6–23)
CO2: 27 mEq/L (ref 19–32)
Calcium: 9.5 mg/dL (ref 8.4–10.5)
Chloride: 105 mEq/L (ref 96–112)
Creatinine, Ser: 0.87 mg/dL (ref 0.40–1.20)
GFR: 76.09 mL/min (ref >60.00)
Glucose, Bld: 86 mg/dL (ref 70–99)
Potassium: 3.9 mEq/L (ref 3.5–5.1)
Sodium: 141 mEq/L (ref 135–145)
Total Bilirubin: 0.8 mg/dL (ref 0.2–1.2)
Total Protein: 6.7 g/dL (ref 6.0–8.3)

## 2021-04-20 LAB — MAGNESIUM: Magnesium: 1.8 mg/dL (ref 1.5–2.5)

## 2021-04-29 ENCOUNTER — Telehealth: Payer: Self-pay | Admitting: Family Medicine

## 2021-04-29 NOTE — Telephone Encounter (Signed)
Called the patient back and got VM and it was full.  Naveen Clardy,cma

## 2021-04-29 NOTE — Telephone Encounter (Signed)
Patient returned office phone call for lab results. 

## 2021-04-30 NOTE — Telephone Encounter (Signed)
I called and spoke with the patient and informed her of her lab results.  Naveen Lorusso,cma

## 2021-07-05 ENCOUNTER — Ambulatory Visit: Admit: 2021-07-05 | Discharge: 2021-07-05 | Payer: MEDICARE

## 2021-07-05 DIAGNOSIS — R600 Localized edema: Principal | ICD-10-CM

## 2021-07-05 DIAGNOSIS — K7030 Alcoholic cirrhosis of liver without ascites: Principal | ICD-10-CM

## 2021-07-05 DIAGNOSIS — K769 Liver disease, unspecified: Principal | ICD-10-CM

## 2021-07-05 DIAGNOSIS — R110 Nausea: Principal | ICD-10-CM

## 2021-07-05 DIAGNOSIS — G629 Polyneuropathy, unspecified: Principal | ICD-10-CM

## 2021-07-05 DIAGNOSIS — J90 Pleural effusion, not elsewhere classified: Principal | ICD-10-CM

## 2021-07-05 MED ORDER — SPIRONOLACTONE 100 MG TABLET
ORAL_TABLET | Freq: Every day | ORAL | 2 refills | 180 days | Status: CP
Start: 2021-07-05 — End: ?

## 2021-07-05 MED ORDER — ONDANSETRON HCL 4 MG TABLET
ORAL_TABLET | Freq: Three times a day (TID) | ORAL | 6 refills | 7 days | Status: CP | PRN
Start: 2021-07-05 — End: ?

## 2021-07-05 MED ORDER — FUROSEMIDE 40 MG TABLET
ORAL_TABLET | Freq: Every day | ORAL | 3 refills | 90 days | Status: CP
Start: 2021-07-05 — End: ?

## 2021-07-05 MED ORDER — GABAPENTIN 100 MG CAPSULE
ORAL_CAPSULE | Freq: Three times a day (TID) | ORAL | 3 refills | 90.00000 days | Status: CP
Start: 2021-07-05 — End: 2022-07-05

## 2021-07-30 ENCOUNTER — Ambulatory Visit: Admit: 2021-07-30 | Discharge: 2021-07-31 | Payer: MEDICARE

## 2021-08-04 ENCOUNTER — Telehealth: Payer: Self-pay | Admitting: Family Medicine

## 2021-08-04 NOTE — Telephone Encounter (Signed)
Copied from CRM [id number]. Topic: Medicare AWV >> Aug 04, 2021 10:07 AM Harris-Coley, [place] Gully wrote: Reason for CRM: Called 08/04/21 to r/s AWV appt 08/11/21- Mailbox full- khc

## 2021-08-11 ENCOUNTER — Ambulatory Visit: Payer: Medicare Other

## 2021-08-24 DIAGNOSIS — K7682 Hepatic encephalopathy: Principal | ICD-10-CM

## 2021-08-26 DIAGNOSIS — K7682 Hepatic encephalopathy: Principal | ICD-10-CM

## 2021-08-26 MED ORDER — RIFAXIMIN 550 MG TABLET
ORAL_TABLET | Freq: Two times a day (BID) | ORAL | 0 refills | 30 days | Status: CP
Start: 2021-08-26 — End: ?

## 2021-08-30 ENCOUNTER — Telehealth: Payer: Self-pay | Admitting: Family Medicine

## 2021-08-30 NOTE — Telephone Encounter (Signed)
Drop off Long Term Disability and [employer] Patient Asst Program application to be filled out. I put in the sign basket.  Rohin Krejci,cma

## 2021-08-31 NOTE — Telephone Encounter (Signed)
The patient signed the provider portion of the [employer] assistance form so this will need to be completed again. I signed the LTD form. Please fill in our information and provide for pick up.

## 2021-09-01 ENCOUNTER — Telehealth: Payer: Self-pay | Admitting: Family Medicine

## 2021-09-01 NOTE — Telephone Encounter (Signed)
Patient does not appear to have prescription insurance (just Medicare A/B and family planning Medicaid, which does not cover prescriptions). I have messaged [facility] to see they dispense Shingrix to patients without prescription insurance.

## 2021-09-01 NOTE — Telephone Encounter (Signed)
This is the patient we discussed regarding the Shingrix patient assistance.  Can you check on whether or not she has part D coverage?  Thanks.

## 2021-09-02 NOTE — Telephone Encounter (Signed)
Patient came into the clinic and picked up the disability form on 09/01/2021.  The [employer] form was not ready I informed the patient that the provider wanted to do more research on the vaccine form and I will call her and let he know when it was ready. She understood.  Julie Graham,cma

## 2021-09-10 NOTE — Telephone Encounter (Signed)
[facility] does not dispense Shingrix to patients. I am checking with pharmacy administration to determine any other solutions.

## 2021-10-20 ENCOUNTER — Ambulatory Visit: Payer: Medicare Other | Admitting: Family Medicine

## 2021-10-22 ENCOUNTER — Ambulatory Visit (INDEPENDENT_AMBULATORY_CARE_PROVIDER_SITE_OTHER): Payer: Medicare Other | Admitting: Family Medicine

## 2021-10-22 ENCOUNTER — Encounter: Payer: Self-pay | Admitting: Family Medicine

## 2021-10-22 ENCOUNTER — Other Ambulatory Visit: Payer: Self-pay

## 2021-10-22 ENCOUNTER — Telehealth: Payer: Self-pay | Admitting: Family Medicine

## 2021-10-22 VITALS — BP 110/70 | HR 77 | Temp 98.5°F | Ht 65.0 in | Wt 220.0 lb

## 2021-10-22 DIAGNOSIS — K7031 Alcoholic cirrhosis of liver with ascites: Secondary | ICD-10-CM

## 2021-10-22 DIAGNOSIS — N950 Postmenopausal bleeding: Secondary | ICD-10-CM | POA: Diagnosis not present

## 2021-10-22 DIAGNOSIS — Z1231 Encounter for screening mammogram for malignant neoplasm of breast: Secondary | ICD-10-CM

## 2021-10-22 DIAGNOSIS — J948 Other specified pleural conditions: Secondary | ICD-10-CM | POA: Diagnosis not present

## 2021-10-22 DIAGNOSIS — R5383 Other fatigue: Secondary | ICD-10-CM | POA: Diagnosis not present

## 2021-10-22 MED ORDER — ONDANSETRON HCL 4 MG PO TABS: 4.0000 mg | ORAL_TABLET | Freq: Two times a day (BID) | ORAL | 0 refills | Status: DC | PRN

## 2021-10-22 NOTE — Progress Notes (Signed)
[name], MD Phone: [phone]  Julie Graham is a 56 y.o. female who presents today for follow-up.  Cirrhosis: Patient notes she is generally doing okay.  She feels as though she is retaining a little bit of fluid though she did not take her spironolactone or Lasix for 2 days as she was out running errands.  She has not taken the lactulose on a daily basis though she does take it when she gets constipated so that she is able to have bowel movements.  She takes rifaximin daily.  She notes her appetite has been getting better though she does still have some intermittent nausea for which she takes Zofran.  She does note some fatigue at times.  She reports that GI gave her a B12 shot at her last visit.  She last saw GI in October 2022.  Hydrothorax: Patient notes her breathing is actually quite well at this time.  She still has some discomfort in her right ribs related to scar tissue.  She tries topical treatments for that with some benefit.  Postmenopausal bleeding: She never saw gynecology.  She does note spotting once monthly at this time.  Social History   Tobacco Use  Smoking Status Never  Smokeless Tobacco Never    Current Outpatient Medications on File Prior to Visit  Medication Sig Dispense Refill   ACCU-CHEK FASTCLIX LANCETS MISC Check once daily, E16.2 102 each 3   blood glucose meter kit and supplies KIT Check once daily if feeling lightheaded. E16.2. 1 each 0   Cyanocobalamin (VITAMIN B-12 CR PO) Take by mouth.     docusate sodium (COLACE) 100 MG capsule Take 100 mg by mouth 2 (two) times daily.     ferrous sulfate 325 (65 FE) MG tablet Take 325 mg by mouth.     folic acid (FOLVITE) 1 MG tablet TAKE 1 TABLET BY MOUTH  DAILY 90 tablet 0   furosemide (LASIX) 20 MG tablet Take 3 tablets (60 mg total) by mouth 2 (two) times daily. 180 tablet 0   gabapentin (NEURONTIN) 100 MG capsule Take 200 mg by mouth at bedtime.  3   glucose blood test strip accu chek once daily, E16.2  100 each 12   Lactulose 20 GM/30ML SOLN Take 30 mLs (20 g total) by mouth daily. 30 mL 3   rifaximin (XIFAXAN) 550 MG TABS tablet Take 550 mg by mouth.     spironolactone (ALDACTONE) 100 MG tablet Take 1 tablet (100 mg total) by mouth 2 (two) times daily. 180 tablet 0   Ascorbic Acid (VITAMIN C IMMUNE HEALTH PO) Take by mouth. (Patient not taking: Reported on 10/22/2021)     Current Facility-Administered Medications on File Prior to Visit  Medication Dose Route Frequency Provider Last Rate Last Admin   0.9 %  sodium chloride infusion  500 mL Intravenous Continuous Pyrtle, Lajuan Lines, MD       albumin human 25 % solution 50 g  50 g Intravenous UD Pyrtle, Lajuan Lines, MD         ROS see history of present illness  Objective  Physical Exam Vitals:   10/22/21 1430  BP: 110/70  Pulse: 77  Temp: 98.5 F (36.9 C)  SpO2: 99%    BP Readings from Last 3 Encounters:  10/22/21 110/70  04/19/21 100/60  09/07/20 130/80   Wt Readings from Last 3 Encounters:  10/22/21 220 lb (99.8 kg)  04/19/21 208 lb 3.2 oz (94.4 kg)  09/07/20 218 lb (98.9 kg)  Physical Exam Constitutional:      General: She is not in acute distress.    Appearance: She is not diaphoretic.  Cardiovascular:     Rate and Rhythm: Normal rate and regular rhythm.     Heart sounds: Normal heart sounds.  Pulmonary:     Effort: Pulmonary effort is normal.     Breath sounds: Normal breath sounds.  Skin:    General: Skin is warm and dry.  Neurological:     Mental Status: She is alert.     Assessment/Plan: Please see individual problem list.  Problem List Items Addressed This Visit     Alcoholic cirrhosis ([place])    Seems to be generally stable.  Her liver enzymes through GI were within normal limits.  I encouraged her to consistently take her spironolactone and Lasix.  I advised that she should have refills at the pharmacy from GI.  She will continue lactulose and rifaximin as well.  She will continue to follow-up with GI.   Fatigue could be related to her chronic cirrhosis issues.  Discussed that Medicare would not pay for checking a B12 or folate for fatigue or cirrhosis.      Relevant Medications   ondansetron (ZOFRAN) 4 MG tablet   Hydrothorax - Primary    Her lung sounds clear today.  If she feels that she is accumulating fluid she will contact GI.      Postmenopausal bleeding    I discussed the importance of seeing GYN for work-up of this issue.  A referral was placed.      Relevant Orders   Ambulatory referral to Gynecology   Other Visit Diagnoses     Other fatigue       Encounter for screening mammogram for malignant neoplasm of breast       Relevant Orders   MM 3D SCREEN BREAST BILATERAL        Health Maintenance: The patient will call to schedule her mammogram.  She is requesting a Shingrix vaccine that does not have drug coverage through Medicare.  I will check with our clinical pharmacist on assistance for this.  Return in about 6 months (around 04/21/2022).  This visit occurred during the SARS-CoV-2 public health emergency.  Safety protocols were in place, including screening questions prior to the visit, additional usage of staff PPE, and extensive cleaning of exam room while observing appropriate contact time as indicated for disinfecting solutions.    [name], MD [place]

## 2021-10-22 NOTE — Assessment & Plan Note (Signed)
I discussed the importance of seeing GYN for work-up of this issue.  A referral was placed.

## 2021-10-22 NOTE — Assessment & Plan Note (Signed)
Her lung sounds clear today.  If she feels that she is accumulating fluid she will contact GI.

## 2021-10-22 NOTE — Telephone Encounter (Signed)
Please let the patient know that I heard back from the pharmacist.  We cannot use the assistance program as it would require the clinic to have 10+ patients to qualify for the free supply of Shingrix.  The patient's options are to get this at her local pharmacy with a good Rx coupon though it looks like it may be $195 per dose.  The other option is waiting until she picks up prescription drug coverage through Medicare.  If she has any questions regarding choosing Medicare plans please refer her to the information at the end of Catie's message.  Thanks.

## 2021-10-22 NOTE — Telephone Encounter (Signed)
I called and spoke with the patient and informed her of the information for the  shingrix about her options and she understood. Patient stated she will wait until she gets the medication prescription coverage in 1 year.  Romy Ipock,cma

## 2021-10-22 NOTE — Assessment & Plan Note (Addendum)
Seems to be generally stable.  Her liver enzymes through GI were within normal limits.  I encouraged her to consistently take her spironolactone and Lasix.  I advised that she should have refills at the pharmacy from GI.  She will continue lactulose and rifaximin as well.  She will continue to follow-up with GI.  Fatigue could be related to her chronic cirrhosis issues.  Discussed that Medicare would not pay for checking a B12 or folate for fatigue or cirrhosis.

## 2021-10-22 NOTE — Patient Instructions (Signed)
Nice to see you. GYN should call you to schedule an appointment. Please call [phone] to schedule your mammogram. Please call your pharmacy for refills on your spironolactone and Lasix.

## 2021-10-22 NOTE — Telephone Encounter (Signed)
-----   Message from Lourena Simmonds, RPH-CPP sent at 10/22/2021  3:28 PM EST ----- Thank you for following up.   We unfortunately cannot use the [employer] assistance, as it requires a clinic to have 10+ patients that would qualify for a free supply.   I checked our outpatient pharmacies, they do not have any cheaper options. I checked the [facility], they do not stock Shingrix.   I would advise the patient to consider getting at a local pharmacy with a GoodRx coupon (cheapest appears to be [facility] for $195 per dose) or wait until she picks up a Part D plan.   If she has questions about choosing Medicare plans, this is where I direct people ->   It can be complex choosing a Medicare plan. A group that I always recommend for my patients is called the [facility] Information Program [facility]). (JudoChat.com.ee , or just google "[place] SHIIP" ) They have good information on their website regarding navigating Medicare. They also have in person volunteer counselors that can help evaluate options in a non-biased way. The [facility] team is at the [facility] [address] [place], [place], [place] [address], [phone]). You can call and set up an appointment to meet with someone to navigate Medicare insurance.   ----- Message ----- From: Glori Luis, MD Sent: 10/22/2021   3:24 PM EST To: Lourena Simmonds, RPH-CPP  Were you able to find anything out on assistance for the shingrix vaccine? Thanks.   Minerva Areola

## 2021-11-23 ENCOUNTER — Telehealth: Payer: Self-pay | Admitting: Family Medicine

## 2021-11-23 NOTE — Telephone Encounter (Signed)
Pt called in stating that she is trying to move in a apartment complex. Pt stated that the property management will not accept her because she has a dog. Pt stated that the property management advise her that if she get a note from her provider they will think about accepting pt in community. Pt stated that ever since she was diagnosis with cirrhosis of the liver that the dog has been her animal support dog. Pt requesting note. Pt requesting callback.

## 2021-11-30 NOTE — Telephone Encounter (Signed)
I called the patient and her mailbox is full.  Thedford Bunton,cma  ?

## 2021-11-30 NOTE — Telephone Encounter (Signed)
This is not something that we have discussed previously.  I would certainly need to discuss further during a visit with the patient before I could determine if the animal is a support animal. ?

## 2022-01-13 ENCOUNTER — Telehealth: Payer: Self-pay | Admitting: Family Medicine

## 2022-01-13 NOTE — Telephone Encounter (Signed)
Attempted to schedule AWV. Unable to LVM.  Will try at later time.  

## 2022-02-28 ENCOUNTER — Telehealth: Payer: Self-pay | Admitting: Family Medicine

## 2022-02-28 NOTE — Telephone Encounter (Signed)
Copied from CRM [id number]. Topic: Medicare AWV >> Feb 28, 2022  9:38 AM Harris-Coley, [place] Gully wrote: Reason for CRM: Attempted to schedule AWV. Unable to LVM.  Will try at later time. Mailbox full

## 2022-04-22 ENCOUNTER — Encounter: Payer: Self-pay | Admitting: Family Medicine

## 2022-04-22 ENCOUNTER — Ambulatory Visit (INDEPENDENT_AMBULATORY_CARE_PROVIDER_SITE_OTHER): Payer: Medicare Other | Admitting: Family Medicine

## 2022-04-22 DIAGNOSIS — N950 Postmenopausal bleeding: Secondary | ICD-10-CM

## 2022-04-22 DIAGNOSIS — L719 Rosacea, unspecified: Secondary | ICD-10-CM

## 2022-04-22 DIAGNOSIS — K7031 Alcoholic cirrhosis of liver with ascites: Secondary | ICD-10-CM

## 2022-04-22 MED ORDER — ONDANSETRON HCL 4 MG PO TABS: 4.0000 mg | ORAL_TABLET | Freq: Two times a day (BID) | ORAL | 0 refills | Status: AC | PRN

## 2022-04-22 NOTE — Assessment & Plan Note (Signed)
Chronic issue.  Patient declines treatment.

## 2022-04-22 NOTE — Addendum Note (Signed)
Addended by: Clearnce Sorrel on: 04/22/2022 02:50 PM   Modules accepted: Orders

## 2022-04-22 NOTE — Addendum Note (Signed)
Addended by: Glori Luis on: 04/22/2022 03:03 PM   Modules accepted: Orders

## 2022-04-22 NOTE — Assessment & Plan Note (Signed)
I discussed that the patient needs to take her Lasix twice daily.  Discussed taking Lasix 20 mg twice daily.  Discussed the need to take her spironolactone twice daily as well.  We will check magnesium and BMP levels.  Refill of Zofran provided given intermittent nausea.

## 2022-04-22 NOTE — Patient Instructions (Signed)
Nice to see you. We will get lab work today and contact you with the results. Please contact gynecology to schedule a visit for your postmenopausal bleeding.  It is important to have this evaluated given the potential underlying causes.

## 2022-04-22 NOTE — Assessment & Plan Note (Signed)
The patient is aware of the importance of seeing GYN for this issue.  I offered to place another referral to GYN though she declined this and noted she would contact them for an evaluation.

## 2022-04-22 NOTE — Progress Notes (Signed)
[name], MD Phone: [phone]  Julie Graham is a 56 y.o. female who presents today for follow-up.  Alcoholic cirrhosis: Patient notes she feels as though she is retaining fluid.  She has not been taking her Lasix twice daily as prescribed.  When she does take her Lasix she takes 20 mg daily.  She takes her Lasix and spironolactone most days.  She continues on lactulose and rifaximin.  She notes her breathing is okay.  She has not had any recent thoracenteses.  She sees GI on 8/16.  She continues to have pain in her right lung from prior thoracentesis.  She does take potassium and magnesium over-the-counter.  Postmenopausal bleeding: Patient notes she has not seen GYN.  Rosacea: Patient notes a history of rosacea.  She does have an erythematous rash on her face.  Social History   Tobacco Use  Smoking Status Never  Smokeless Tobacco Never    Current Outpatient Medications on File Prior to Visit  Medication Sig Dispense Refill   ACCU-CHEK FASTCLIX LANCETS MISC Check once daily, E16.2 102 each 3   Ascorbic Acid (VITAMIN C IMMUNE HEALTH PO) Take by mouth.     blood glucose meter kit and supplies KIT Check once daily if feeling lightheaded. E16.2. 1 each 0   Cyanocobalamin (VITAMIN B-12 CR PO) Take by mouth.     docusate sodium (COLACE) 100 MG capsule Take 100 mg by mouth 2 (two) times daily.     furosemide (LASIX) 20 MG tablet Take 3 tablets (60 mg total) by mouth 2 (two) times daily. 180 tablet 0   gabapentin (NEURONTIN) 100 MG capsule Take 200 mg by mouth at bedtime.  3   glucose blood test strip accu chek once daily, E16.2 100 each 12   Lactulose 20 GM/30ML SOLN Take 30 mLs (20 g total) by mouth daily. 30 mL 3   magnesium gluconate 39m/5ml syringe Take by mouth.     rifaximin (XIFAXAN) 550 MG TABS tablet Take 550 mg by mouth.     spironolactone (ALDACTONE) 100 MG tablet Take 1 tablet (100 mg total) by mouth 2 (two) times daily. 180 tablet 0   Current Facility-Administered  Medications on File Prior to Visit  Medication Dose Route Frequency Provider Last Rate Last Admin   0.9 %  sodium chloride infusion  500 mL Intravenous Continuous Pyrtle, JLajuan Lines MD       albumin human 25 % solution 50 g  50 g Intravenous UD Pyrtle, JLajuan Lines MD         ROS see history of present illness  Objective  Physical Exam Vitals:   04/22/22 1422  BP: 126/74  Pulse: 94  Temp: 97.9 F (36.6 C)  SpO2: 96%    BP Readings from Last 3 Encounters:  04/22/22 126/74  10/22/21 110/70  04/19/21 100/60   Wt Readings from Last 3 Encounters:  04/22/22 250 lb 6.4 oz (113.6 kg)  10/22/21 220 lb (99.8 kg)  04/19/21 208 lb 3.2 oz (94.4 kg)    Physical Exam Constitutional:      General: She is not in acute distress.    Appearance: She is not diaphoretic.  Cardiovascular:     Rate and Rhythm: Normal rate and regular rhythm.     Heart sounds: Normal heart sounds.  Pulmonary:     Effort: Pulmonary effort is normal.     Breath sounds: Normal breath sounds.  Skin:    General: Skin is warm and dry.     Comments:  Erythematous rash on bilateral cheeks with telangiectasias and pustules  Neurological:     Mental Status: She is alert.      Assessment/Plan: Please see individual problem list.  Problem List Items Addressed This Visit     Alcoholic cirrhosis ([place]) (Chronic)    I discussed that the patient needs to take her Lasix twice daily.  Discussed taking Lasix 20 mg twice daily.  Discussed the need to take her spironolactone twice daily as well.  We will check magnesium and BMP levels.  Refill of Zofran provided given intermittent nausea.      Relevant Medications   ondansetron (ZOFRAN) 4 MG tablet   Other Relevant Orders   Magnesium   Basic Metabolic Panel (BMET)   I37   Rosacea (Chronic)    Chronic issue.  Patient declines treatment.      Postmenopausal bleeding    The patient is aware of the importance of seeing GYN for this issue.  I offered to place another referral  to GYN though she declined this and noted she would contact them for an evaluation.        Return in about 6 months (around 10/23/2022).   [name], MD [place]

## 2022-04-23 LAB — BASIC METABOLIC PANEL WITH GFR
BUN: 15 mg/dL (ref 7–25)
CO2: 26 mmol/L (ref 20–32)
Calcium: 9 mg/dL (ref 8.6–10.4)
Chloride: 108 mmol/L (ref 98–110)
Creat: 0.84 mg/dL (ref 0.50–1.03)
Glucose, Bld: 75 mg/dL (ref 65–99)
Potassium: 4.3 mmol/L (ref 3.5–5.3)
Sodium: 142 mmol/L (ref 135–146)
eGFR: 83 mL/min/{1.73_m2} (ref >=60)

## 2022-04-23 LAB — VITAMIN B12: Vitamin B-12: 369 pg/mL (ref 200–1100)

## 2022-04-23 LAB — MAGNESIUM: Magnesium: 2 mg/dL (ref 1.5–2.5)

## 2022-05-11 ENCOUNTER — Ambulatory Visit: Admit: 2022-05-11 | Discharge: 2022-05-12 | Payer: MEDICARE

## 2022-05-11 DIAGNOSIS — K7460 Unspecified cirrhosis of liver: Principal | ICD-10-CM

## 2022-07-19 ENCOUNTER — Ambulatory Visit: Payer: Medicare Other

## 2022-07-19 ENCOUNTER — Telehealth: Payer: Self-pay

## 2022-07-19 NOTE — Telephone Encounter (Signed)
Unable to reach patient for scheduled AWV. Unable to leave voice mail. Reschedule as appropriate.

## 2022-07-21 ENCOUNTER — Ambulatory Visit (INDEPENDENT_AMBULATORY_CARE_PROVIDER_SITE_OTHER): Payer: Medicare Other

## 2022-07-21 VITALS — Ht 65.0 in | Wt 250.0 lb

## 2022-07-21 DIAGNOSIS — Z0000 Encounter for general adult medical examination without abnormal findings: Secondary | ICD-10-CM | POA: Diagnosis not present

## 2022-07-21 NOTE — Progress Notes (Signed)
Subjective:   Julie Graham is a 56 y.o. female who presents for Medicare Annual (Subsequent) preventive examination.  Review of Systems    No ROS.  Medicare Wellness Virtual Visit.  Visual/audio telehealth visit, UTA vital signs.   See social history for additional risk factors.         Objective:    Today's Vitals   07/21/22 1120  Weight: 250 lb (113.4 kg)  Height: 5\' 5"  (1.651 m)   Body mass index is 41.6 kg/m.     07/21/2022   11:27 AM 08/10/2020    1:46 PM 06/14/2016    8:07 AM 06/08/2016    4:23 PM 06/08/2016    4:22 PM 06/02/2016   11:05 AM 04/26/2016    1:00 AM  Advanced Directives  Does Patient Have a Medical Advance Directive? No No No No No No No  Would patient like information on creating a medical advance directive? No - Patient declined No - Patient declined No - patient declined information No - patient declined information No - patient declined information Yes - Educational materials given No - patient declined information    Current Medications (verified) Outpatient Encounter Medications as of 07/21/2022  Medication Sig   ACCU-CHEK FASTCLIX LANCETS MISC Check once daily, E16.2   Ascorbic Acid (VITAMIN C IMMUNE HEALTH PO) Take by mouth.   blood glucose meter kit and supplies KIT Check once daily if feeling lightheaded. E16.2.   Cyanocobalamin (VITAMIN B-12 CR PO) Take by mouth.   docusate sodium (COLACE) 100 MG capsule Take 100 mg by mouth 2 (two) times daily.   furosemide (LASIX) 20 MG tablet Take 3 tablets (60 mg total) by mouth 2 (two) times daily.   gabapentin (NEURONTIN) 100 MG capsule Take 200 mg by mouth at bedtime.   glucose blood test strip accu chek once daily, E16.2   Lactulose 20 GM/30ML SOLN Take 30 mLs (20 g total) by mouth daily.   magnesium gluconate 54mg /46ml syringe Take by mouth.   ondansetron (ZOFRAN) 4 MG tablet Take 1 tablet (4 mg total) by mouth 2 (two) times daily as needed for nausea or vomiting.   rifaximin (XIFAXAN) 550 MG TABS  tablet Take 550 mg by mouth.   spironolactone (ALDACTONE) 100 MG tablet Take 1 tablet (100 mg total) by mouth 2 (two) times daily.   Facility-Administered Encounter Medications as of 07/21/2022  Medication   0.9 %  sodium chloride infusion   albumin human 25 % solution 50 g    Allergies (verified) Duloxetine and Sulfa antibiotics   History: Past Medical History:  Diagnosis Date   Alcoholism (HCC)    Anal fissure    ?   Anastomotic ulcer, acute    Anxiety    Blood in stool    Cirrhosis (HCC)    Confusion    Depression    Heart murmur    History of pneumothorax 04/17/2017   Hypertension    Hypoalbuminemia    Portal hypertension (HCC)    SBP (spontaneous bacterial peritonitis) (HCC) 05/12/2016   UTI (lower urinary tract infection)    Past Surgical History:  Procedure Laterality Date   CESAREAN SECTION  1989   CHOLECYSTECTOMY     GASTRIC BYPASS  2009   IR GENERIC HISTORICAL  06/02/2016   IR TRANSCATHETER BX 06/02/2016 08/02/2016, MD [facility]-INTERV RAD   Family History  Problem Relation Age of Onset   Alcoholism Maternal Grandfather    Stroke Maternal Grandfather    Arthritis Mother  Hyperlipidemia Mother    Hypertension Mother    Stroke Mother    Lymphoma Mother    Heart failure Mother    COPD Mother    Diabetes Mother    Breast cancer Cousin    Heart disease Father        CHF   Diabetes Father    Arthritis Father    Factor V Leiden deficiency Father    Hyperlipidemia Father    Hypertension Father    COPD Father    Sudden death Paternal Uncle    Lung cancer Maternal Grandmother    Social History   Socioeconomic History   Marital status: Divorced    Spouse name: Not on file   Number of children: 1    Years of education: Not on file   Highest education level: Not on file  Occupational History   Occupation: accounts Radiographer, therapeutic: [employer]  Tobacco Use   Smoking status: Never   Smokeless tobacco: Never  Substance and Sexual Activity   Alcohol  use: No    Alcohol/week: 0.0 standard drinks of alcohol    Comment: stopped drinking 02/16/16   Drug use: No   Sexual activity: Not on file  Other Topics Concern   Not on file  Social History Narrative   1 biological son and 1 adopted son   Social Determinants of Health   Financial Resource Strain: Low Risk  (07/21/2022)   Overall Financial Resource Strain (CARDIA)    Difficulty of Paying Living Expenses: Not hard at all  Food Insecurity: No Food Insecurity (07/21/2022)   Hunger Vital Sign    Worried About Running Out of Food in the Last Year: Never true    Ran Out of Food in the Last Year: Never true  Transportation Needs: No Transportation Needs (07/21/2022)   PRAPARE - Hydrologist (Medical): No    Lack of Transportation (Non-Medical): No  Physical Activity: Not on file  Stress: No Stress Concern Present (07/21/2022)   [place]    Feeling of Stress : Not at all  Social Connections: Unknown (07/21/2022)   Social Connection and Isolation Panel [NHANES]    Frequency of Communication with Friends and Family: Not on file    Frequency of Social Gatherings with Friends and Family: More than three times a week    Attends Religious Services: Not on Advertising copywriter or Organizations: Not on file    Attends Archivist Meetings: Not on file    Marital Status: Not on file    Tobacco Counseling Counseling given: Not Answered   Clinical Intake:  Pre-visit preparation completed: Yes        Diabetes: No  How often do you need to have someone help you when you read instructions, pamphlets, or other written materials from your doctor or pharmacy?: 1 - Never    Interpreter Needed?: No      Activities of Daily Living    07/21/2022   11:21 AM  In your present state of health, do you have any difficulty performing the following activities:  Hearing? 0   Vision? 0  Difficulty concentrating or making decisions? 0  Walking or climbing stairs? 0  Dressing or bathing? 0  Doing errands, shopping? 0  Preparing Food and eating ? N  Using the Toilet? N  In the past six months, have you accidently leaked urine? N  Do you have problems with loss of bowel control? N  Managing your Medications? N  Managing your Finances? N  Housekeeping or managing your Housekeeping? N    Patient Care Team: Leone Haven, MD as PCP - General (Family Medicine)  Indicate any recent Medical Services you may have received from other than [facility] providers in the past year (date may be approximate).     Assessment:   This is a routine wellness examination for Zaiyah.  I connected with  TSERING LEAMAN on 07/21/22 by a audio enabled telemedicine application and verified that I am speaking with the correct person using two identifiers.  Patient Location: Home  Provider Location: Office/Clinic  I discussed the limitations of evaluation and management by telemedicine. The patient expressed understanding and agreed to proceed.   Hearing/Vision screen Hearing Screening - Comments:: Patient is able to hear conversational tones without difficulty. No issues reported. Vision Screening - Comments:: Followed by  Wears corrective lenses Cataract extraction, bilateral They have seen their ophthalmologist in the last 12 months.    Dietary issues and exercise activities discussed: Current Exercise Habits: The patient has a physically strenuous job, but has no regular exercise apart from work. Low sodium/low carb diet Good water intake   Goals Addressed             This Visit's Progress    Follow up with Primary Care Provider       As needed. Stay active       Depression Screen    07/21/2022   11:14 AM 04/22/2022    2:27 PM 04/19/2021    4:05 PM 08/10/2020    1:42 PM 10/14/2019    2:49 PM 04/06/2016   11:43 AM 02/10/2016    5:20 PM  PHQ 2/9 Scores  PHQ  - 2 Score 0 0 0 0 0 4 2  PHQ- 9 Score      15 7    Fall Risk    07/21/2022   11:32 AM 04/22/2022    2:26 PM 04/19/2021    4:05 PM 08/10/2020    1:47 PM 10/14/2019    2:49 PM  [place] in the past year? 0 0 1 0 0  Number falls in past yr: 0 0 0 0 0  Injury with Fall? 0 0 0    Risk for fall due to : No Fall Risks No Fall Risks     Follow up Falls evaluation completed Falls evaluation completed Falls evaluation completed Falls evaluation completed Falls evaluation completed    [place]: Home free of loose throw rugs in walkways, pet beds, electrical cords, etc? Yes  Adequate lighting in your home to reduce risk of falls? Yes   ASSISTIVE DEVICES UTILIZED TO PREVENT FALLS: Life alert? No  Use of a cane, walker or w/c? No   TIMED UP AND GO: Was the test performed? No .   Cognitive Function:        07/21/2022   11:33 AM  6CIT Screen  What Year? 0 points  What month? 0 points  What time? 0 points  Count back from 20 0 points  Months in reverse 0 points  Repeat phrase 0 points  Total Score 0 points    Immunizations Immunization History  Administered Date(s) Administered   Influenza,inj,Quad PF,6+ Mos 07/11/2016, 10/11/2017, 07/19/2018, 07/05/2021   Influenza-Unspecified 08/26/2016, 06/26/2018   Pneumococcal Polysaccharide-23 07/12/2016   TDAP status: Due, Education has been provided  regarding the importance of this vaccine. Advised may receive this vaccine at local pharmacy or Health Dept. Aware to provide a copy of the vaccination record if obtained from local pharmacy or Health Dept. Verbalized acceptance and understanding.  Flu Vaccine status: Due, Education has been provided regarding the importance of this vaccine. Advised may receive this vaccine at local pharmacy or Health Dept. Aware to provide a copy of the vaccination record if obtained from local pharmacy or Health Dept. Verbalized acceptance and  understanding.  Shingrix Completed?: No.    Education has been provided regarding the importance of this vaccine. Patient has been advised to call insurance company to determine out of pocket expense if they have not yet received this vaccine. Advised may also receive vaccine at local pharmacy or Health Dept. Verbalized acceptance and understanding.  Screening Tests Health Maintenance  Topic Date Due   HIV Screening  Never done   MAMMOGRAM  Never done   PAP SMEAR-Modifier  08/26/2022 (Originally 12/07/1986)   Zoster Vaccines- Shingrix (1 of 2) 10/21/2022 (Originally 12/07/2017)   INFLUENZA VACCINE  12/25/2022 (Originally 04/26/2022)   TETANUS/TDAP  07/22/2023 (Originally 12/08/1986)   Medicare Annual Wellness (AWV)  08/21/2023   COLONOSCOPY (Pts 45-10yrs Insurance coverage will need to be confirmed)  06/23/2030   Hepatitis C Screening  Completed   HPV VACCINES  Aged Out   COVID-19 Vaccine  Discontinued   Health Maintenance Health Maintenance Due  Topic Date Due   HIV Screening  Never done   MAMMOGRAM  Never done   Pap Smear- plans to schedule with OBGYN. Agrees to update once completed.   Hepatitis C Screening: Completed 2017.  Vision Screening: Recommended annual ophthalmology exams for early detection of glaucoma and other disorders of the eye.  Dental Screening: Recommended annual dental exams for proper oral hygiene.  Community Resource Referral / Chronic Care Management: CRR required this visit?  No   CCM required this visit?  No      Plan:     I have personally reviewed and noted the following in the patient's chart:   Medical and social history Use of alcohol, tobacco or illicit drugs  Current medications and supplements including opioid prescriptions. Patient is not currently taking opioid prescriptions. Functional ability and status Nutritional status Physical activity Advanced directives List of other physicians Hospitalizations, surgeries, and ER visits in  previous 12 months Vitals Screenings to include cognitive, depression, and falls Referrals and appointments  In addition, I have reviewed and discussed with patient certain preventive protocols, quality metrics, and best practice recommendations. A written personalized care plan for preventive services as well as general preventive health recommendations were provided to patient.     Leta Jungling, LPN   [id number]

## 2022-07-21 NOTE — Patient Instructions (Addendum)
Marland KitchenMs. Graham , Thank you for taking time to come for your Medicare Wellness Visit. I appreciate your ongoing commitment to your health goals. Please review the following plan we discussed and let me know if I can assist you in the future.   These are the goals we discussed:  Goals      Follow up with Primary Care Provider     As needed. Stay active        This is a list of the screening recommended for you and due dates:  Health Maintenance  Topic Date Due   HIV Screening  Never done   Mammogram  Never done   Pap Smear  08/26/2022*   Zoster (Shingles) Vaccine (1 of 2) 10/21/2022*   Flu Shot  12/25/2022*   Tetanus Vaccine  07/22/2023*   Medicare Annual Wellness Visit  08/21/2023   Colon Cancer Screening  06/23/2030   Hepatitis C Screening: USPSTF Recommendation to screen - Ages 18-79 yo.  Completed   HPV Vaccine  Aged Out   COVID-19 Vaccine  Discontinued  *Topic was postponed. The date shown is not the original due date.    Advanced directives: End of life planning; Advance aging; Advanced directives discussed.  Copy of current HCPOA/Living Will requested.    End of life planning; Advanced aging; Advanced directives discussed.  No HCPOA/Living Will.  Additional information declined at this time. Additional information available as needed.   Conditions/risks identified:   Next appointment: Follow up in one year for your annual wellness visit.   Preventive Care 40-64 Years, Female Preventive care refers to lifestyle choices and visits with your health care provider that can promote health and wellness. What does preventive care include? A yearly physical exam. This is also called an annual well check. Dental exams once or twice a year. Routine eye exams. Ask your health care provider how often you should have your eyes checked. Personal lifestyle choices, including: Daily care of your teeth and gums. Regular physical activity. Eating a healthy diet. Avoiding tobacco and  drug use. Limiting alcohol use. Practicing safe sex. Taking low-dose aspirin daily starting at age 74. Taking vitamin and mineral supplements as recommended by your health care provider. What happens during an annual well check? The services and screenings done by your health care provider during your annual well check will depend on your age, overall health, lifestyle risk factors, and family history of disease. Counseling  Your health care provider may ask you questions about your: Alcohol use. Tobacco use. Drug use. Emotional well-being. Home and relationship well-being. Sexual activity. Eating habits. Work and work Statistician. Method of birth control. Menstrual cycle. Pregnancy history. Screening  You may have the following tests or measurements: Height, weight, and BMI. Blood pressure. Lipid and cholesterol levels. These may be checked every 5 years, or more frequently if you are over 88 years old. Skin check. Lung cancer screening. You may have this screening every year starting at age 51 if you have a 30-pack-year history of smoking and currently smoke or have quit within the past 15 years. Fecal occult blood test (FOBT) of the stool. You may have this test every year starting at age 55. Flexible sigmoidoscopy or colonoscopy. You may have a sigmoidoscopy every 5 years or a colonoscopy every 10 years starting at age 58. Hepatitis C blood test. Hepatitis B blood test. Sexually transmitted disease (STD) testing. Diabetes screening. This is done by checking your blood sugar (glucose) after you have not eaten for a while (  fasting). You may have this done every 1-3 years. Mammogram. This may be done every 1-2 years. Talk to your health care provider about when you should start having regular mammograms. This may depend on whether you have a family history of breast cancer. BRCA-related cancer screening. This may be done if you have a family history of breast, ovarian, tubal, or  peritoneal cancers. Pelvic exam and Pap test. This may be done every 3 years starting at age 55. Starting at age 19, this may be done every 5 years if you have a Pap test in combination with an HPV test. Bone density scan. This is done to screen for osteoporosis. You may have this scan if you are at high risk for osteoporosis. Discuss your test results, treatment options, and if necessary, the need for more tests with your health care provider. Vaccines  Your health care provider may recommend certain vaccines, such as: Influenza vaccine. This is recommended every year. Tetanus, diphtheria, and acellular pertussis (Tdap, Td) vaccine. You may need a Td booster every 10 years. Zoster vaccine. You may need this after age 36. Pneumococcal 13-valent conjugate (PCV13) vaccine. You may need this if you have certain conditions and were not previously vaccinated. Pneumococcal polysaccharide (PPSV23) vaccine. You may need one or two doses if you smoke cigarettes or if you have certain conditions. Talk to your health care provider about which screenings and vaccines you need and how often you need them. This information is not intended to replace advice given to you by your health care provider. Make sure you discuss any questions you have with your health care provider. Document Released: 10/09/2015 Document Revised: 06/01/2016 Document Reviewed: 07/14/2015 Elsevier Interactive Patient Education  2017 [employer] Prevention in the Home Falls can cause injuries. They can happen to people of all ages. There are many things you can do to make your home safe and to help prevent falls. What can I do on the outside of my home? Regularly fix the edges of walkways and driveways and fix any cracks. Remove anything that might make you trip as you walk through a door, such as a raised step or threshold. Trim any bushes or trees on the path to your home. Use bright outdoor lighting. Clear any walking  paths of anything that might make someone trip, such as rocks or tools. Regularly check to see if handrails are loose or broken. Make sure that both sides of any steps have handrails. Any raised decks and porches should have guardrails on the edges. Have any leaves, snow, or ice cleared regularly. Use sand or salt on walking paths during winter. Clean up any spills in your garage right away. This includes oil or grease spills. What can I do in the bathroom? Use night lights. Install grab bars by the toilet and in the tub and shower. Do not use towel bars as grab bars. Use non-skid mats or decals in the tub or shower. If you need to sit down in the shower, use a plastic, non-slip stool. Keep the floor dry. Clean up any water that spills on the floor as soon as it happens. Remove soap buildup in the tub or shower regularly. Attach bath mats securely with double-sided non-slip rug tape. Do not have throw rugs and other things on the floor that can make you trip. What can I do in the bedroom? Use night lights. Make sure that you have a light by your bed that is easy to  reach. Do not use any sheets or blankets that are too big for your bed. They should not hang down onto the floor. Have a firm chair that has side arms. You can use this for support while you get dressed. Do not have throw rugs and other things on the floor that can make you trip. What can I do in the kitchen? Clean up any spills right away. Avoid walking on wet floors. Keep items that you use a lot in easy-to-reach places. If you need to reach something above you, use a strong step stool that has a grab bar. Keep electrical cords out of the way. Do not use floor polish or wax that makes floors slippery. If you must use wax, use non-skid floor wax. Do not have throw rugs and other things on the floor that can make you trip. What can I do with my stairs? Do not leave any items on the stairs. Make sure that there are handrails  on both sides of the stairs and use them. Fix handrails that are broken or loose. Make sure that handrails are as long as the stairways. Check any carpeting to make sure that it is firmly attached to the stairs. Fix any carpet that is loose or worn. Avoid having throw rugs at the top or bottom of the stairs. If you do have throw rugs, attach them to the floor with carpet tape. Make sure that you have a light switch at the top of the stairs and the bottom of the stairs. If you do not have them, ask someone to add them for you. What else can I do to help prevent falls? Wear shoes that: Do not have high heels. Have rubber bottoms. Are comfortable and fit you well. Are closed at the toe. Do not wear sandals. If you use a stepladder: Make sure that it is fully opened. Do not climb a closed stepladder. Make sure that both sides of the stepladder are locked into place. Ask someone to hold it for you, if possible. Clearly mark and make sure that you can see: Any grab bars or handrails. First and last steps. Where the edge of each step is. Use tools that help you move around (mobility aids) if they are needed. These include: Canes. Walkers. Scooters. Crutches. Turn on the lights when you go into a dark area. Replace any light bulbs as soon as they burn out. Set up your furniture so you have a clear path. Avoid moving your furniture around. If any of your floors are uneven, fix them. If there are any pets around you, be aware of where they are. Review your medicines with your doctor. Some medicines can make you feel dizzy. This can increase your chance of falling. Ask your doctor what other things that you can do to help prevent falls. This information is not intended to replace advice given to you by your health care provider. Make sure you discuss any questions you have with your health care provider. Document Released: 07/09/2009 Document Revised: 02/18/2016 Document Reviewed:  10/17/2014 Elsevier Interactive Patient Education  2017 [employer].

## 2022-08-02 DIAGNOSIS — K7682 Hepatic encephalopathy (CMS-HCC): Principal | ICD-10-CM

## 2022-08-16 DIAGNOSIS — R600 Localized edema: Principal | ICD-10-CM

## 2022-08-16 DIAGNOSIS — K7030 Alcoholic cirrhosis of liver without ascites: Principal | ICD-10-CM

## 2022-08-16 DIAGNOSIS — J90 Pleural effusion, not elsewhere classified: Principal | ICD-10-CM

## 2022-08-16 DIAGNOSIS — K7682 Hepatic encephalopathy (CMS-HCC): Principal | ICD-10-CM

## 2022-08-16 MED ORDER — SPIRONOLACTONE 100 MG TABLET
ORAL_TABLET | Freq: Every day | ORAL | 1 refills | 90 days | Status: CP
Start: 2022-08-16 — End: ?

## 2022-08-16 MED ORDER — LACTULOSE 10 GRAM/15 ML ORAL SOLUTION
Freq: Three times a day (TID) | ORAL | 11 refills | 30 days | Status: CP
Start: 2022-08-16 — End: ?

## 2022-08-16 MED ORDER — FUROSEMIDE 40 MG TABLET
ORAL_TABLET | Freq: Every day | ORAL | 1 refills | 90 days | Status: CP
Start: 2022-08-16 — End: ?

## 2022-08-21 MED ORDER — GABAPENTIN 100 MG CAPSULE
ORAL_CAPSULE | Freq: Three times a day (TID) | ORAL | 3 refills | 0 days
Start: 2022-08-21 — End: ?

## 2022-08-24 DIAGNOSIS — G629 Polyneuropathy, unspecified: Principal | ICD-10-CM

## 2022-08-24 MED ORDER — GABAPENTIN 100 MG CAPSULE
ORAL_CAPSULE | Freq: Three times a day (TID) | ORAL | 3 refills | 90.00000 days | Status: CP
Start: 2022-08-24 — End: ?

## 2022-10-24 ENCOUNTER — Ambulatory Visit (INDEPENDENT_AMBULATORY_CARE_PROVIDER_SITE_OTHER): Payer: Medicare Other | Admitting: Family Medicine

## 2022-10-24 ENCOUNTER — Encounter: Payer: Self-pay | Admitting: Family Medicine

## 2022-10-24 VITALS — BP 114/68 | HR 70 | Temp 98.0°F | Resp 16 | Ht 65.0 in | Wt 224.5 lb

## 2022-10-24 DIAGNOSIS — F32A Depression, unspecified: Secondary | ICD-10-CM

## 2022-10-24 DIAGNOSIS — Z862 Personal history of diseases of the blood and blood-forming organs and certain disorders involving the immune mechanism: Secondary | ICD-10-CM

## 2022-10-24 DIAGNOSIS — K7031 Alcoholic cirrhosis of liver with ascites: Secondary | ICD-10-CM | POA: Diagnosis not present

## 2022-10-24 DIAGNOSIS — M720 Palmar fascial fibromatosis [Dupuytren]: Secondary | ICD-10-CM | POA: Insufficient documentation

## 2022-10-24 DIAGNOSIS — G2581 Restless legs syndrome: Secondary | ICD-10-CM

## 2022-10-24 DIAGNOSIS — F419 Anxiety disorder, unspecified: Secondary | ICD-10-CM

## 2022-10-24 DIAGNOSIS — Z13220 Encounter for screening for lipoid disorders: Secondary | ICD-10-CM

## 2022-10-24 NOTE — Assessment & Plan Note (Addendum)
Chronic issue.  Offered referral to a hand surgeon for treatment of this.  She defers this at this time and she will let me know if she changes her mind.

## 2022-10-24 NOTE — Assessment & Plan Note (Signed)
Check ferritin level. ?

## 2022-10-24 NOTE — Progress Notes (Signed)
[name], MD Phone: [phone]  Julie Graham is a 57 y.o. female who presents today for follow-up.  Cirrhosis: Patient continues to follow with GI at [facility].  She is taking Lasix, lactulose, rifaximin, and spironolactone.  She has been more consistently taking her Lasix and spironolactone and notes this has been beneficial.  She still gets some swelling and notes more swelling when she misses her medication.  She notes her breathing has been good.  She notes if she overdoes the Lasix and spironolactone she gets significant cramps in her legs.  Obesity: Patient notes she is eating lots of fruits and vegetables.  She does not eat a lot of meat though does try to get enough protein.  Anxiety/depression: Patient notes no anxiety or depression.  No SI.  Restless legs: Patient reports at night she feels as though she has to move her legs and they are restless.  She does report a history of iron deficiency anemia in the past.  Joint nodules in hands: Patient reports some nodules in several of her fingers and her hands.  She notes her left pinky finger cannot extend fully and is being pulled towards her palm.  Social History   Tobacco Use  Smoking Status Never  Smokeless Tobacco Never    Current Outpatient Medications on File Prior to Visit  Medication Sig Dispense Refill   ACCU-CHEK FASTCLIX LANCETS MISC Check once daily, E16.2 102 each 3   Ascorbic Acid (VITAMIN C IMMUNE HEALTH PO) Take by mouth.     blood glucose meter kit and supplies KIT Check once daily if feeling lightheaded. E16.2. 1 each 0   Cyanocobalamin (VITAMIN B-12 CR PO) Take by mouth.     docusate sodium (COLACE) 100 MG capsule Take 100 mg by mouth 2 (two) times daily.     furosemide (LASIX) 20 MG tablet Take 3 tablets (60 mg total) by mouth 2 (two) times daily. 180 tablet 0   gabapentin (NEURONTIN) 100 MG capsule Take 200 mg by mouth at bedtime.  3   glucose blood test strip accu chek once daily, E16.2 100 each 12    Lactulose 20 GM/30ML SOLN Take 30 mLs (20 g total) by mouth daily. 30 mL 3   magnesium gluconate 54mg /40ml syringe Take by mouth.     ondansetron (ZOFRAN) 4 MG tablet Take 1 tablet (4 mg total) by mouth 2 (two) times daily as needed for nausea or vomiting. 20 tablet 0   rifaximin (XIFAXAN) 550 MG TABS tablet Take 550 mg by mouth.     spironolactone (ALDACTONE) 100 MG tablet Take 1 tablet (100 mg total) by mouth 2 (two) times daily. 180 tablet 0   Current Facility-Administered Medications on File Prior to Visit  Medication Dose Route Frequency Provider Last Rate Last Admin   0.9 %  sodium chloride infusion  500 mL Intravenous Continuous Pyrtle, Lajuan Lines, MD       albumin human 25 % solution 50 g  50 g Intravenous UD Pyrtle, Lajuan Lines, MD         ROS see history of present illness  Objective  Physical Exam Vitals:   10/24/22 1527  BP: 114/68  Pulse: 70  Resp: 16  Temp: 98 F (36.7 C)  SpO2: 99%    BP Readings from Last 3 Encounters:  10/24/22 114/68  04/22/22 126/74  10/22/21 110/70   Wt Readings from Last 3 Encounters:  10/24/22 224 lb 8 oz (101.8 kg)  07/21/22 250 lb (113.4 kg)  04/22/22  250 lb 6.4 oz (113.6 kg)    Physical Exam Constitutional:      General: She is not in acute distress.    Appearance: She is not diaphoretic.  Cardiovascular:     Rate and Rhythm: Normal rate and regular rhythm.     Heart sounds: Normal heart sounds.  Pulmonary:     Effort: Pulmonary effort is normal.     Breath sounds: Normal breath sounds.  Musculoskeletal:     Comments: Likely Dupuytren's contracture of left pinky finger  Skin:    General: Skin is warm and dry.  Neurological:     Mental Status: She is alert.      Assessment/Plan: Please see individual problem list.  Alcoholic cirrhosis of liver with ascites ([place]) Assessment & Plan: Chronic issue.  Seems to be fairly well-controlled at this time.  She will continue Lasix 60 mg twice daily, lactulose 30 mL daily, rifaximin 550  mg daily, and spironolactone 100 mg twice daily.  She will see GI as planned.  Orders: -     Comprehensive metabolic panel  Anxiety and depression Assessment & Plan: Asymptomatic.  Monitor for recurrence.   Restless leg Assessment & Plan: Check ferritin level.  Orders: -     Ferritin -     CBC  History of iron deficiency anemia -     Ferritin -     CBC  Lipid screening -     Lipid panel  Dupuytren's contracture Assessment & Plan: Chronic issue.  Offered referral to a hand surgeon for treatment of this.  She defers this at this time and she will let me know if she changes her mind.     Return in about 6 months (around 04/24/2023).   [name], MD [place]

## 2022-10-24 NOTE — Assessment & Plan Note (Addendum)
Chronic issue.  Seems to be fairly well-controlled at this time.  She will continue Lasix 60 mg twice daily, lactulose 30 mL daily, rifaximin 550 mg daily, and spironolactone 100 mg twice daily.  She will see GI as planned.

## 2022-10-24 NOTE — Assessment & Plan Note (Signed)
Asymptomatic.  Monitor for recurrence. 

## 2022-10-25 LAB — COMPREHENSIVE METABOLIC PANEL
ALT: 16 U/L (ref 0–35)
AST: 31 U/L (ref 0–37)
Albumin: 3.4 g/dL — ABNORMAL LOW (ref 3.5–5.2)
Alkaline Phosphatase: 77 U/L (ref 39–117)
BUN: 17 mg/dL (ref 6–23)
CO2: 30 mEq/L (ref 19–32)
Calcium: 8.9 mg/dL (ref 8.4–10.5)
Chloride: 104 mEq/L (ref 96–112)
Creatinine, Ser: 0.99 mg/dL (ref 0.40–1.20)
GFR: 64.48 mL/min (ref >60.00)
Glucose, Bld: 78 mg/dL (ref 70–99)
Potassium: 4.4 mEq/L (ref 3.5–5.1)
Sodium: 139 mEq/L (ref 135–145)
Total Bilirubin: 0.6 mg/dL (ref 0.2–1.2)
Total Protein: 6.2 g/dL (ref 6.0–8.3)

## 2022-10-25 LAB — LIPID PANEL
Cholesterol: 144 mg/dL (ref 0–200)
HDL: 46.3 mg/dL (ref >39.00)
LDL Cholesterol: 84 mg/dL (ref 0–99)
NonHDL: 97.4
Total CHOL/HDL Ratio: 3
Triglycerides: 66 mg/dL (ref 0.0–149.0)
VLDL: 13.2 mg/dL (ref 0.0–40.0)

## 2022-10-25 LAB — FERRITIN: Ferritin: 63.5 ng/mL (ref 10.0–291.0)

## 2022-10-25 LAB — CBC
HCT: 38.4 % (ref 36.0–46.0)
Hemoglobin: 12.8 g/dL (ref 12.0–15.0)
MCHC: 33.4 g/dL (ref 30.0–36.0)
MCV: 86.6 fl (ref 78.0–100.0)
Platelets: 223 10*3/uL (ref 150.0–400.0)
RBC: 4.43 Mil/uL (ref 3.87–5.11)
RDW: 13.2 % (ref 11.5–15.5)
WBC: 6.3 10*3/uL (ref 4.0–10.5)

## 2022-10-26 ENCOUNTER — Other Ambulatory Visit: Payer: Self-pay | Admitting: Family Medicine

## 2022-10-26 DIAGNOSIS — Z8639 Personal history of other endocrine, nutritional and metabolic disease: Secondary | ICD-10-CM

## 2022-10-26 DIAGNOSIS — G2581 Restless legs syndrome: Secondary | ICD-10-CM

## 2022-10-26 MED ORDER — FERROUS SULFATE 325 (65 FE) MG PO TABS: 325.0000 mg | ORAL_TABLET | Freq: Every day | ORAL | 1 refills | Status: DC

## 2022-11-16 ENCOUNTER — Ambulatory Visit: Admit: 2022-11-16 | Discharge: 2022-11-17 | Payer: MEDICARE

## 2022-11-16 DIAGNOSIS — K7460 Unspecified cirrhosis of liver: Principal | ICD-10-CM

## 2022-11-16 DIAGNOSIS — R160 Hepatomegaly, not elsewhere classified: Principal | ICD-10-CM

## 2022-11-30 ENCOUNTER — Other Ambulatory Visit: Payer: Self-pay | Admitting: Family Medicine

## 2022-11-30 DIAGNOSIS — G2581 Restless legs syndrome: Secondary | ICD-10-CM

## 2022-12-27 ENCOUNTER — Other Ambulatory Visit: Payer: Medicare Other

## 2023-02-19 DIAGNOSIS — R600 Localized edema: Principal | ICD-10-CM

## 2023-02-19 DIAGNOSIS — K7030 Alcoholic cirrhosis of liver without ascites: Principal | ICD-10-CM

## 2023-02-19 DIAGNOSIS — J90 Pleural effusion, not elsewhere classified: Principal | ICD-10-CM

## 2023-02-19 MED ORDER — SPIRONOLACTONE 100 MG TABLET
ORAL_TABLET | Freq: Every day | ORAL | 1 refills | 0 days
Start: 2023-02-19 — End: ?

## 2023-02-19 MED ORDER — FUROSEMIDE 40 MG TABLET
ORAL_TABLET | Freq: Every day | ORAL | 1 refills | 0 days
Start: 2023-02-19 — End: ?

## 2023-02-23 ENCOUNTER — Encounter: Payer: Self-pay | Admitting: Family Medicine

## 2023-02-23 MED ORDER — SPIRONOLACTONE 100 MG TABLET
ORAL_TABLET | Freq: Every day | ORAL | 1 refills | 90 days | Status: CP
Start: 2023-02-23 — End: ?

## 2023-02-23 MED ORDER — FUROSEMIDE 40 MG TABLET
ORAL_TABLET | Freq: Every day | ORAL | 1 refills | 90 days | Status: CP
Start: 2023-02-23 — End: ?

## 2023-04-05 DIAGNOSIS — G629 Polyneuropathy, unspecified: Principal | ICD-10-CM

## 2023-04-05 MED ORDER — GABAPENTIN 100 MG CAPSULE
ORAL_CAPSULE | Freq: Every evening | ORAL | 1 refills | 0 days
Start: 2023-04-05 — End: ?

## 2023-04-10 MED ORDER — GABAPENTIN 100 MG CAPSULE
ORAL_CAPSULE | Freq: Every evening | ORAL | 1 refills | 90 days | Status: CP
Start: 2023-04-10 — End: ?

## 2023-04-24 ENCOUNTER — Ambulatory Visit: Payer: Medicare Other | Admitting: Family Medicine

## 2023-05-01 ENCOUNTER — Ambulatory Visit: Payer: Medicare Other | Admitting: Family Medicine

## 2023-05-19 ENCOUNTER — Ambulatory Visit: Payer: Medicare Other | Admitting: Family Medicine

## 2023-05-19 ENCOUNTER — Ambulatory Visit: Admit: 2023-05-19 | Discharge: 2023-05-20 | Payer: MEDICARE

## 2023-05-19 DIAGNOSIS — K7030 Alcoholic cirrhosis of liver without ascites: Principal | ICD-10-CM

## 2023-05-19 DIAGNOSIS — R110 Nausea: Principal | ICD-10-CM

## 2023-05-31 DIAGNOSIS — K7682 Hepatic encephalopathy (CMS-HCC): Principal | ICD-10-CM

## 2023-05-31 MED ORDER — XIFAXAN 550 MG TABLET
ORAL_TABLET | ORAL | 2 refills | 0 days
Start: 2023-05-31 — End: ?

## 2023-06-03 MED ORDER — XIFAXAN 550 MG TABLET
ORAL_TABLET | ORAL | 2 refills | 0 days | Status: CP
Start: 2023-06-03 — End: ?

## 2023-06-14 ENCOUNTER — Ambulatory Visit: Payer: Medicare Other | Admitting: Family Medicine

## 2023-06-19 ENCOUNTER — Encounter: Payer: Self-pay | Admitting: Family Medicine

## 2023-06-19 ENCOUNTER — Ambulatory Visit (INDEPENDENT_AMBULATORY_CARE_PROVIDER_SITE_OTHER): Payer: Medicare Other | Admitting: Family Medicine

## 2023-06-19 VITALS — BP 128/70 | HR 68 | Temp 98.2°F | Ht 65.0 in | Wt 249.2 lb

## 2023-06-19 DIAGNOSIS — R87619 Unspecified abnormal cytological findings in specimens from cervix uteri: Secondary | ICD-10-CM | POA: Insufficient documentation

## 2023-06-19 DIAGNOSIS — Z1231 Encounter for screening mammogram for malignant neoplasm of breast: Secondary | ICD-10-CM | POA: Diagnosis not present

## 2023-06-19 DIAGNOSIS — K7031 Alcoholic cirrhosis of liver with ascites: Secondary | ICD-10-CM

## 2023-06-19 DIAGNOSIS — E162 Hypoglycemia, unspecified: Secondary | ICD-10-CM

## 2023-06-19 DIAGNOSIS — R87610 Atypical squamous cells of undetermined significance on cytologic smear of cervix (ASC-US): Secondary | ICD-10-CM

## 2023-06-19 NOTE — Patient Instructions (Signed)
Nice to see you. Please call [phone] to schedule your mammogram.

## 2023-06-19 NOTE — Assessment & Plan Note (Signed)
Chronic recurrent issue.  Has recurred more frequently recently.  Will refer to endocrinology for further evaluation.

## 2023-06-19 NOTE — Assessment & Plan Note (Signed)
Patient was encouraged to contact GYN to set up colposcopy.

## 2023-06-19 NOTE — Assessment & Plan Note (Signed)
Chronic issue.  Stable.  She will continue Lasix 60 mg twice daily, lactulose 30 mg daily, and spironolactone 100 mg twice daily.  She will continue to follow with GI.

## 2023-06-19 NOTE — Progress Notes (Signed)
[name], MD Phone: [phone]  Julie Graham is a 57 y.o. female who presents today for follow-up.  Alcoholic cirrhosis: Patient notes she is generally doing well.  Her breathing is okay and stable.  She has pain in her right rib area from prior thoracentesis.  She is on Lasix, spironolactone, and lactulose.  She has skipped doses of Lasix and spironolactone the last couple of days given that she has been out and about.  She notes no abdominal pain.  She does continue to follow with GI.  Still has some nausea though notes her appetite has been improving.  Abnormal Pap smear: Patient had ASCUS with positive HPV on recent Pap smear.  She saw GYN and they documented that the patient declined a colposcopy though patient notes they never advised her that she needed a colposcopy.  She also had a possibly thickened endometrial stripe and they recommended endometrial biopsy though the patient declined this given that it was borderline.  Hypoglycemia: Patient has had recurrent hypoglycemic episodes over the last several months.  This has been an issue in the past though it had resolved itself.  She notes blood sugars got down into the 50s and she will take glucose tablets or eat something like peanut butter.  Notes symptoms of hypoglycemia occurs after eating other times it happens well after eating.  Social History   Tobacco Use  Smoking Status Never  Smokeless Tobacco Never    Current Outpatient Medications on File Prior to Visit  Medication Sig Dispense Refill   ACCU-CHEK FASTCLIX LANCETS MISC Check once daily, E16.2 102 each 3   Ascorbic Acid (VITAMIN C IMMUNE HEALTH PO) Take by mouth.     blood glucose meter kit and supplies KIT Check once daily if feeling lightheaded. E16.2. 1 each 0   Cyanocobalamin (VITAMIN B-12 CR PO) Take by mouth.     docusate sodium (COLACE) 100 MG capsule Take 100 mg by mouth 2 (two) times daily.     ferrous sulfate 325 (65 FE) MG tablet TAKE 1 TABLET BY MOUTH  EVERY DAY WITH BREAKFAST 90 tablet 1   furosemide (LASIX) 20 MG tablet Take 3 tablets (60 mg total) by mouth 2 (two) times daily. 180 tablet 0   gabapentin (NEURONTIN) 100 MG capsule Take 200 mg by mouth at bedtime.  3   glucose blood test strip accu chek once daily, E16.2 100 each 12   Lactulose 20 GM/30ML SOLN Take 30 mLs (20 g total) by mouth daily. 30 mL 3   magnesium gluconate 54mg /16ml syringe Take by mouth.     ondansetron (ZOFRAN) 4 MG tablet Take 1 tablet (4 mg total) by mouth 2 (two) times daily as needed for nausea or vomiting. 20 tablet 0   rifaximin (XIFAXAN) 550 MG TABS tablet Take 550 mg by mouth.     spironolactone (ALDACTONE) 100 MG tablet Take 1 tablet (100 mg total) by mouth 2 (two) times daily. 180 tablet 0   Current Facility-Administered Medications on File Prior to Visit  Medication Dose Route Frequency Provider Last Rate Last Admin   0.9 %  sodium chloride infusion  500 mL Intravenous Continuous Pyrtle, Carie Caddy, MD       albumin human 25 % solution 50 g  50 g Intravenous UD Pyrtle, Carie Caddy, MD         ROS see history of present illness  Objective  Physical Exam Vitals:   06/19/23 1436  BP: 128/70  Pulse: 68  Temp: 98.2 F (  36.8 C)  SpO2: 99%    BP Readings from Last 3 Encounters:  06/19/23 128/70  10/24/22 114/68  04/22/22 126/74   Wt Readings from Last 3 Encounters:  06/19/23 249 lb 3.2 oz (113 kg)  10/24/22 224 lb 8 oz (101.8 kg)  07/21/22 250 lb (113.4 kg)    Physical Exam Constitutional:      General: She is not in acute distress.    Appearance: She is not diaphoretic.  Cardiovascular:     Rate and Rhythm: Normal rate and regular rhythm.     Heart sounds: Normal heart sounds.  Pulmonary:     Effort: Pulmonary effort is normal.     Breath sounds: Normal breath sounds.  Skin:    General: Skin is warm and dry.  Neurological:     Mental Status: She is alert.      Assessment/Plan: Please see individual problem  list.  Hypoglycemia Assessment & Plan: Chronic recurrent issue.  Has recurred more frequently recently.  Will refer to endocrinology for further evaluation.  Orders: -     Ambulatory referral to Endocrinology  Alcoholic cirrhosis of liver with ascites ([facility]) Assessment & Plan: Chronic issue.  Stable.  She will continue Lasix 60 mg twice daily, lactulose 30 mg daily, and spironolactone 100 mg twice daily.  She will continue to follow with GI.   Atypical squamous cells of undetermined significance on cytologic smear of cervix (ASC-US) Assessment & Plan: Patient was encouraged to contact GYN to set up colposcopy.   Encounter for screening mammogram for malignant neoplasm of breast -     3D Screening Mammogram, Left and Right; Future     Return in about 6 months (around 12/17/2023).   [name], MD [facility]

## 2023-06-29 ENCOUNTER — Ambulatory Visit
Admission: RE | Admit: 2023-06-29 | Discharge: 2023-06-29 | Disposition: A | Discharge status: Home / Self Care | Payer: Medicare Other | Source: Ambulatory Visit | Attending: Family Medicine | Admitting: Family Medicine

## 2023-06-29 ENCOUNTER — Encounter: Payer: Self-pay | Admitting: Radiology

## 2023-06-29 DIAGNOSIS — Z1231 Encounter for screening mammogram for malignant neoplasm of breast: Secondary | ICD-10-CM | POA: Insufficient documentation

## 2023-07-12 ENCOUNTER — Ambulatory Visit: Admit: 2023-07-12 | Discharge: 2023-07-13 | Payer: MEDICARE

## 2023-07-24 ENCOUNTER — Ambulatory Visit (INDEPENDENT_AMBULATORY_CARE_PROVIDER_SITE_OTHER): Payer: Medicare Other | Admitting: *Deleted

## 2023-07-24 VITALS — Ht 65.0 in | Wt 249.0 lb

## 2023-07-24 DIAGNOSIS — Z0000 Encounter for general adult medical examination without abnormal findings: Secondary | ICD-10-CM

## 2023-07-24 NOTE — Patient Instructions (Signed)
Julie Graham , Thank you for taking time to come for your Medicare Wellness Visit. I appreciate your ongoing commitment to your health goals. Please review the following plan we discussed and let me know if I can assist you in the future.   Referrals/Orders/Follow-Ups/Clinician Recommendations: Remember to get an Eye Exam.  This is a list of the screening recommended for you and due dates:  Health Maintenance  Topic Date Due   HIV Screening  Never done   DTaP/Tdap/Td vaccine (1 - Tdap) Never done   Zoster (Shingles) Vaccine (1 of 2) Never done   Pap with HPV screening  Never done   Flu Shot  12/25/2023*   Medicare Annual Wellness Visit  07/23/2024   Mammogram  06/28/2025   Colon Cancer Screening  06/23/2030   Hepatitis C Screening  Completed   HPV Vaccine  Aged Out   COVID-19 Vaccine  Discontinued  *Topic was postponed. The date shown is not the original due date.    Advanced directives: (Declined) Advance directive discussed with you today. Even though you declined this today, please call our office should you change your mind, and we can give you the proper paperwork for you to fill out.  Next Medicare Annual Wellness Visit scheduled for next year: Yes 07/29/24 @ 1:30

## 2023-07-24 NOTE — Progress Notes (Signed)
Subjective:   Julie Graham is a 57 y.o. female who presents for Medicare Annual (Subsequent) preventive examination.  Visit Complete: Virtual I connected with  Julie Graham on 07/24/23 by a audio enabled telemedicine application and verified that I am speaking with the correct person using two identifiers.  Patient Location: Home  Provider Location: Office/Clinic  I discussed the limitations of evaluation and management by telemedicine. The patient expressed understanding and agreed to proceed.  Vital Signs: Because this visit was a virtual/telehealth visit, some criteria may be missing or patient reported. Any vitals not documented were not able to be obtained and vitals that have been documented are patient reported.   Cardiac Risk Factors include: obesity (BMI >30kg/m2)     Objective:    Today's Vitals   07/24/23 1248  Weight: 249 lb (112.9 kg)  Height: 5\' 5"  (1.651 m)   Body mass index is 41.44 kg/m.     07/24/2023    1:10 PM 07/21/2022   11:27 AM 08/10/2020    1:46 PM 06/14/2016    8:07 AM 06/08/2016    4:23 PM 06/08/2016    4:22 PM 06/02/2016   11:05 AM  Advanced Directives  Does Patient Have a Medical Advance Directive? No No No No No No No  Would patient like information on creating a medical advance directive? No - Patient declined No - Patient declined No - Patient declined No - patient declined information No - patient declined information No - patient declined information Yes - Educational materials given    Current Medications (verified) Outpatient Encounter Medications as of 07/24/2023  Medication Sig   ACCU-CHEK FASTCLIX LANCETS MISC Check once daily, E16.2   Ascorbic Acid (VITAMIN C IMMUNE HEALTH PO) Take by mouth.   blood glucose meter kit and supplies KIT Check once daily if feeling lightheaded. E16.2.   ferrous sulfate 325 (65 FE) MG tablet TAKE 1 TABLET BY MOUTH EVERY DAY WITH BREAKFAST   furosemide (LASIX) 20 MG tablet Take 3 tablets (60 mg total)  by mouth 2 (two) times daily.   gabapentin (NEURONTIN) 100 MG capsule Take 200 mg by mouth at bedtime.   glucose blood test strip accu chek once daily, E16.2   Lactulose 20 GM/30ML SOLN Take 30 mLs (20 g total) by mouth daily.   magnesium gluconate 54mg /53ml syringe Take by mouth.   ondansetron (ZOFRAN) 4 MG tablet Take 1 tablet (4 mg total) by mouth 2 (two) times daily as needed for nausea or vomiting.   rifaximin (XIFAXAN) 550 MG TABS tablet Take 550 mg by mouth.   spironolactone (ALDACTONE) 100 MG tablet Take 1 tablet (100 mg total) by mouth 2 (two) times daily.   Cyanocobalamin (VITAMIN B-12 CR PO) Take by mouth. (Patient not taking: Reported on 07/24/2023)   docusate sodium (COLACE) 100 MG capsule Take 100 mg by mouth 2 (two) times daily. (Patient not taking: Reported on 07/24/2023)   Facility-Administered Encounter Medications as of 07/24/2023  Medication   0.9 %  sodium chloride infusion   albumin human 25 % solution 50 g    Allergies (verified) Duloxetine and Sulfa antibiotics   History: Past Medical History:  Diagnosis Date   Alcoholism (HCC)    Anal fissure    ?   Anastomotic ulcer, acute    Anxiety    Blood in stool    Cirrhosis (HCC)    Confusion    Depression    Heart murmur    History of pneumothorax 04/17/2017  Hypertension    Hypoalbuminemia    Portal hypertension (HCC)    SBP (spontaneous bacterial peritonitis) (HCC) 05/12/2016   UTI (lower urinary tract infection)    Past Surgical History:  Procedure Laterality Date   CESAREAN SECTION  1989   CHOLECYSTECTOMY     GASTRIC BYPASS  2009   IR GENERIC HISTORICAL  06/02/2016   IR TRANSCATHETER BX 06/02/2016 Jolaine Click, MD [facility]   Family History  Problem Relation Age of Onset   Alcoholism Maternal Grandfather    Stroke Maternal Grandfather    Arthritis Mother    Hyperlipidemia Mother    Hypertension Mother    Stroke Mother    Lymphoma Mother    Heart failure Mother    COPD Mother    Diabetes  Mother    Breast cancer Cousin    Heart disease Father        CHF   Diabetes Father    Arthritis Father    Factor V Leiden deficiency Father    Hyperlipidemia Father    Hypertension Father    COPD Father    Sudden death Paternal Uncle    Lung cancer Maternal Grandmother    Social History   Socioeconomic History   Marital status: Divorced    Spouse name: Not on file   Number of children: 1    Years of education: Not on file   Highest education level: Not on file  Occupational History   Occupation: accounts Chief of Staff: [employer]  Tobacco Use   Smoking status: Never   Smokeless tobacco: Never  Substance and Sexual Activity   Alcohol use: No    Alcohol/week: 0.0 standard drinks of alcohol    Comment: stopped drinking 02/16/16   Drug use: No   Sexual activity: Not on file  Other Topics Concern   Not on file  Social History Narrative   1 biological son and 1 adopted son   Social Determinants of Health   Financial Resource Strain: Low Risk  (07/24/2023)   Overall Financial Resource Strain (CARDIA)    Difficulty of Paying Living Expenses: Not hard at all  Food Insecurity: No Food Insecurity (07/24/2023)   Hunger Vital Sign    Worried About Running Out of Food in the Last Year: Never true    Ran Out of Food in the Last Year: Never true  Transportation Needs: No Transportation Needs (07/24/2023)   PRAPARE - Administrator, Civil Service (Medical): No    Lack of Transportation (Non-Medical): No  Physical Activity: Inactive (07/24/2023)   Exercise Vital Sign    Days of Exercise per Week: 0 days    Minutes of Exercise per Session: 0 min  Stress: No Stress Concern Present (07/24/2023)   [employer] of Occupational Health - Occupational Stress Questionnaire    Feeling of Stress : Only a little  Social Connections: Socially Integrated (07/24/2023)   Social Connection and Isolation Panel [NHANES]    Frequency of Communication with Friends and  Family: More than three times a week    Frequency of Social Gatherings with Friends and Family: More than three times a week    Attends Religious Services: More than 4 times per year    Active Member of [employer] or Organizations: Yes    Attends Engineer, structural: More than 4 times per year    Marital Status: Living with partner    Tobacco Counseling Counseling given: Not Answered   Clinical Intake:  Pre-visit  preparation completed: Yes  Pain : No/denies pain     BMI - recorded: 41.44 Nutritional Status: BMI > 30  Obese Nutritional Risks: None Diabetes: No  How often do you need to have someone help you when you read instructions, pamphlets, or other written materials from your doctor or pharmacy?: 1 - Never  Interpreter Needed?: No  Information entered by :: R. Ridley Dileo LPN   Activities of Daily Living    07/24/2023   12:50 PM  In your present state of health, do you have any difficulty performing the following activities:  Hearing? 0  Vision? 0  Comment readers  Difficulty concentrating or making decisions? 1  Walking or climbing stairs? 1  Comment at times  Dressing or bathing? 0  Doing errands, shopping? 0  Preparing Food and eating ? N  Using the Toilet? N  In the past six months, have you accidently leaked urine? N  Do you have problems with loss of bowel control? N  Managing your Medications? N  Managing your Finances? N  Housekeeping or managing your Housekeeping? N    Patient Care Team: Glori Luis, MD as PCP - General (Family Medicine)  Indicate any recent Medical Services you may have received from other than [facility] providers in the past year (date may be approximate).     Assessment:   This is a routine wellness examination for Julie Graham.  Hearing/Vision screen Hearing Screening - Comments:: No issues Vision Screening - Comments:: readers   Goals Addressed             This Visit's Progress    Patient Stated       Wants to lose  weight and get in better shape       Depression Screen    07/24/2023    1:00 PM 06/19/2023    2:37 PM 10/24/2022    3:24 PM 07/21/2022   11:14 AM 04/22/2022    2:27 PM 04/19/2021    4:05 PM 08/10/2020    1:42 PM  PHQ 2/9 Scores  PHQ - 2 Score 0 0 1 0 0 0 0  PHQ- 9 Score 6 0 8        Fall Risk    07/24/2023   12:54 PM 06/19/2023    2:37 PM 10/24/2022    3:24 PM 07/21/2022   11:32 AM 04/22/2022    2:26 PM  Fall Risk   Falls in the past year? 0 0 0 0 0  Number falls in past yr: 0 0 0 0 0  Injury with Fall? 0 0 0 0 0  Risk for fall due to : No Fall Risks No Fall Risks No Fall Risks No Fall Risks No Fall Risks  Follow up Falls prevention discussed;Falls evaluation completed Falls evaluation completed Falls evaluation completed Falls evaluation completed Falls evaluation completed    MEDICARE RISK AT HOME: Medicare Risk at Home Any stairs in or around the home?: Yes If so, are there any without handrails?: No Home free of loose throw rugs in walkways, pet beds, electrical cords, etc?: No (pet beds in the corner) Adequate lighting in your home to reduce risk of falls?: Yes Life alert?: No Use of a cane, walker or w/c?: No Grab bars in the bathroom?: No Shower chair or bench in shower?: No Elevated toilet seat or a handicapped toilet?: No     Cognitive Function:        07/24/2023    1:10 PM 07/21/2022   11:33 AM  6CIT Screen  What Year? 0 points 0 points  What month? 0 points 0 points  What time? 0 points 0 points  Count back from 20 0 points 0 points  Months in reverse 0 points 0 points  Repeat phrase 0 points 0 points  Total Score 0 points 0 points    Immunizations Immunization History  Administered Date(s) Administered   Influenza,inj,Quad PF,6+ Mos 07/11/2016, 10/11/2017, 07/19/2018, 07/05/2021   Influenza-Unspecified 08/26/2016, 06/26/2018   Pneumococcal Polysaccharide-23 07/12/2016    TDAP status: Due, Education has been provided regarding the  importance of this vaccine. Advised may receive this vaccine at local pharmacy or Health Dept. Aware to provide a copy of the vaccination record if obtained from local pharmacy or Health Dept. Verbalized acceptance and understanding.  Flu Vaccine status: Due, Education has been provided regarding the importance of this vaccine. Advised may receive this vaccine at local pharmacy or Health Dept. Aware to provide a copy of the vaccination record if obtained from local pharmacy or Health Dept. Verbalized acceptance and understanding.  Pneumococcal vaccine status: Due, Education has been provided regarding the importance of this vaccine. Advised may receive this vaccine at local pharmacy or Health Dept. Aware to provide a copy of the vaccination record if obtained from local pharmacy or Health Dept. Verbalized acceptance and understanding.  Covid-19 vaccine status: Information provided on how to obtain vaccines.   Qualifies for Shingles Vaccine? Yes   Zostavax completed No   Shingrix Completed?: No.    Education has been provided regarding the importance of this vaccine. Patient has been advised to call insurance company to determine out of pocket expense if they have not yet received this vaccine. Advised may also receive vaccine at local pharmacy or Health Dept. Verbalized acceptance and understanding.  Screening Tests Health Maintenance  Topic Date Due   HIV Screening  Never done   DTaP/Tdap/Td (1 - Tdap) Never done   Zoster Vaccines- Shingrix (1 of 2) Never done   Cervical Cancer Screening (HPV/Pap Cotest)  Never done   Medicare Annual Wellness (AWV)  07/22/2023   INFLUENZA VACCINE  12/25/2023 (Originally 04/27/2023)   MAMMOGRAM  06/28/2025   Colonoscopy  06/23/2030   Hepatitis C Screening  Completed   HPV VACCINES  Aged Out   COVID-19 Vaccine  Discontinued    Health Maintenance  Health Maintenance Due  Topic Date Due   HIV Screening  Never done   DTaP/Tdap/Td (1 - Tdap) Never done    Zoster Vaccines- Shingrix (1 of 2) Never done   Cervical Cancer Screening (HPV/Pap Cotest)  Never done   Medicare Annual Wellness (AWV)  07/22/2023    Colorectal cancer screening: Type of screening: Colonoscopy. Completed 05/2020. Repeat every 10 years  Mammogram status: Completed 06/2023. Repeat every year    Lung Cancer Screening: (Low Dose CT Chest recommended if Age 52-80 years, 20 pack-year currently smoking OR have quit w/in 15years.) does not qualify.     Additional Screening:  Hepatitis C Screening: does qualify; Completed 01/2016  Vision Screening: Recommended annual ophthalmology exams for early detection of glaucoma and other disorders of the eye. Is the patient up to date with their annual eye exam?  No  Who is the provider or what is the name of the office in which the patient attends annual eye exams? Does not have an eye doctor, will work on it If pt is not established with a provider, would they like to be referred to a provider to establish care? No .  Dental Screening: Recommended annual dental exams for proper oral hygiene    Community Resource Referral / Chronic Care Management: CRR required this visit?  No   CCM required this visit?  No     Plan:     I have personally reviewed and noted the following in the patient's chart:   Medical and social history Use of alcohol, tobacco or illicit drugs  Current medications and supplements including opioid prescriptions. Patient is not currently taking opioid prescriptions. Functional ability and status Nutritional status Physical activity Advanced directives List of other physicians Hospitalizations, surgeries, and ER visits in previous 12 months Vitals Screenings to include cognitive, depression, and falls Referrals and appointments  In addition, I have reviewed and discussed with patient certain preventive protocols, quality metrics, and best practice recommendations. A written personalized care plan  for preventive services as well as general preventive health recommendations were provided to patient.     Sydell Axon, LPN   [id number]   After Visit Summary: (MyChart) Due to this being a telephonic visit, the after visit summary with patients personalized plan was offered to patient via MyChart   Nurse Notes: None

## 2023-08-18 DIAGNOSIS — K7682 Hepatic encephalopathy (CMS-HCC): Principal | ICD-10-CM

## 2023-08-18 MED ORDER — RIFAXIMIN 550 MG TABLET
ORAL_TABLET | Freq: Two times a day (BID) | ORAL | 0 refills | 30 days | Status: CP
Start: 2023-08-18 — End: ?

## 2023-08-20 DIAGNOSIS — K7682 Hepatic encephalopathy (CMS-HCC): Principal | ICD-10-CM

## 2023-08-31 ENCOUNTER — Ambulatory Visit: Admit: 2023-08-31 | Discharge: 2023-09-01 | Payer: MEDICARE

## 2023-08-31 DIAGNOSIS — K7682 Hepatic encephalopathy (CMS-HCC): Principal | ICD-10-CM

## 2023-08-31 DIAGNOSIS — K7460 Unspecified cirrhosis of liver: Principal | ICD-10-CM

## 2023-08-31 DIAGNOSIS — G629 Polyneuropathy, unspecified: Principal | ICD-10-CM

## 2023-08-31 DIAGNOSIS — R059 Cough, unspecified type: Principal | ICD-10-CM

## 2023-08-31 DIAGNOSIS — R601 Generalized edema: Principal | ICD-10-CM

## 2023-08-31 MED ORDER — RIFAXIMIN 550 MG TABLET
ORAL_TABLET | Freq: Two times a day (BID) | ORAL | 4 refills | 90 days | Status: CP
Start: 2023-08-31 — End: ?

## 2023-08-31 MED ORDER — GABAPENTIN 100 MG CAPSULE
ORAL_CAPSULE | Freq: Every evening | ORAL | 1 refills | 90 days | Status: CP
Start: 2023-08-31 — End: ?

## 2023-09-16 DIAGNOSIS — J90 Pleural effusion, not elsewhere classified: Principal | ICD-10-CM

## 2023-09-16 DIAGNOSIS — K7030 Alcoholic cirrhosis of liver without ascites: Principal | ICD-10-CM

## 2023-09-16 DIAGNOSIS — R600 Localized edema: Principal | ICD-10-CM

## 2023-09-16 MED ORDER — FUROSEMIDE 40 MG TABLET
ORAL_TABLET | Freq: Every day | ORAL | 1 refills | 0.00 days
Start: 2023-09-16 — End: ?

## 2023-09-16 MED ORDER — SPIRONOLACTONE 100 MG TABLET
ORAL_TABLET | Freq: Every day | ORAL | 1 refills | 0.00 days
Start: 2023-09-16 — End: ?

## 2023-09-17 MED ORDER — FUROSEMIDE 40 MG TABLET
ORAL_TABLET | Freq: Every day | ORAL | 1 refills | 90.00 days | Status: CP
Start: 2023-09-17 — End: ?

## 2023-09-17 MED ORDER — SPIRONOLACTONE 100 MG TABLET
ORAL_TABLET | Freq: Every day | ORAL | 1 refills | 90.00 days | Status: CP
Start: 2023-09-17 — End: ?

## 2023-12-06 ENCOUNTER — Telehealth: Payer: Self-pay | Admitting: Family Medicine

## 2023-12-06 NOTE — Telephone Encounter (Signed)
 Dr Clent Ridges is leaving the practice and your Transfer of Care needs to be rescheduled with another provider. Please call the office to schedule a Transfer of Care to either Dr Charlann Lange, Darleen Crocker or Kara Dies, NP.   Thank you

## 2023-12-18 ENCOUNTER — Encounter: Payer: Medicare Other | Admitting: Family Medicine

## 2023-12-18 ENCOUNTER — Ambulatory Visit: Payer: Medicare Other | Admitting: Family Medicine

## 2024-05-22 ENCOUNTER — Ambulatory Visit: Admit: 2024-05-22 | Discharge: 2024-05-22 | Payer: MEDICARE

## 2024-05-22 DIAGNOSIS — R772 Abnormality of alphafetoprotein: Principal | ICD-10-CM

## 2024-05-22 DIAGNOSIS — M720 Palmar fascial fibromatosis [Dupuytren]: Principal | ICD-10-CM

## 2024-05-22 DIAGNOSIS — E66813 Class 3 severe obesity with body mass index (BMI) of 40.0 to 44.9 in adult, unspecified obesity type, unspecified whether serious comorbidity present (CMS-HCC): Principal | ICD-10-CM

## 2024-05-22 DIAGNOSIS — Z6841 Body Mass Index (BMI) 40.0 and over, adult: Principal | ICD-10-CM

## 2024-05-22 DIAGNOSIS — K7460 Unspecified cirrhosis of liver: Principal | ICD-10-CM

## 2024-05-22 MED ORDER — TOPIRAMATE 25 MG TABLET
ORAL_TABLET | Freq: Every day | ORAL | 11 refills | 30.00000 days | Status: CP
Start: 2024-05-22 — End: ?

## 2024-06-20 ENCOUNTER — Ambulatory Visit
Admit: 2024-06-20 | Discharge: 2024-06-21 | Payer: MEDICARE | Attending: Orthopaedic Surgery | Primary: Orthopaedic Surgery

## 2024-07-02 DIAGNOSIS — K7030 Alcoholic cirrhosis of liver without ascites: Principal | ICD-10-CM

## 2024-07-02 DIAGNOSIS — R600 Localized edema: Principal | ICD-10-CM

## 2024-07-02 DIAGNOSIS — J90 Pleural effusion, not elsewhere classified: Principal | ICD-10-CM

## 2024-07-02 MED ORDER — FUROSEMIDE 40 MG TABLET
ORAL_TABLET | Freq: Every day | ORAL | 1 refills | 90.00000 days | Status: CP
Start: 2024-07-02 — End: ?

## 2024-07-25 DIAGNOSIS — K7682 Hepatic encephalopathy    (CMS-HCC): Principal | ICD-10-CM

## 2024-08-05 ENCOUNTER — Ambulatory Visit (INDEPENDENT_AMBULATORY_CARE_PROVIDER_SITE_OTHER)

## 2024-08-05 VITALS — BP 123/71 | HR 66 | Ht 65.0 in | Wt 249.0 lb

## 2024-08-05 DIAGNOSIS — K7682 Hepatic encephalopathy: Secondary | ICD-10-CM

## 2024-08-05 DIAGNOSIS — R87619 Unspecified abnormal cytological findings in specimens from cervix uteri: Secondary | ICD-10-CM

## 2024-08-05 DIAGNOSIS — R739 Hyperglycemia, unspecified: Secondary | ICD-10-CM | POA: Diagnosis not present

## 2024-08-05 DIAGNOSIS — K7030 Alcoholic cirrhosis of liver without ascites: Secondary | ICD-10-CM | POA: Diagnosis not present

## 2024-08-05 DIAGNOSIS — Z9884 Bariatric surgery status: Secondary | ICD-10-CM | POA: Insufficient documentation

## 2024-08-05 DIAGNOSIS — K7031 Alcoholic cirrhosis of liver with ascites: Secondary | ICD-10-CM | POA: Diagnosis not present

## 2024-08-05 MED ORDER — LACTULOSE 20 GM/30ML PO SOLN: 30.0000 mL | Freq: Every day | ORAL | 3 refills | Status: AC
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
## 2024-08-06 LAB — URINALYSIS, ROUTINE W REFLEX MICROSCOPIC
Bilirubin Urine: NEGATIVE
Glucose, UA: NEGATIVE
Hgb urine dipstick: NEGATIVE
Nitrite: NEGATIVE
Protein, ur: NEGATIVE
RBC / HPF: NONE SEEN /HPF (ref 0–2)
Specific Gravity, Urine: 1.019 (ref 1.001–1.035)
pH: 6.5 (ref 5.0–8.0)

## 2024-08-06 LAB — LIPID PANEL
Cholesterol: 149 mg/dL (ref <200)
HDL: 45 mg/dL — ABNORMAL LOW (ref >=50)
LDL Cholesterol (Calc): 90 mg/dL
Non-HDL Cholesterol (Calc): 104 mg/dL (ref <130)
Total CHOL/HDL Ratio: 3.3 (calc) (ref <5.0)
Triglycerides: 54 mg/dL (ref <150)

## 2024-08-06 LAB — COMPREHENSIVE METABOLIC PANEL WITH GFR
AG Ratio: 1.4 (calc) (ref 1.0–2.5)
ALT: 11 U/L (ref 6–29)
AST: 27 U/L (ref 10–35)
Albumin: 3.6 g/dL (ref 3.6–5.1)
Alkaline phosphatase (APISO): 55 U/L (ref 37–153)
BUN: 10 mg/dL (ref 7–25)
CO2: 28 mmol/L (ref 20–32)
Calcium: 9.2 mg/dL (ref 8.6–10.4)
Chloride: 107 mmol/L (ref 98–110)
Creat: 0.75 mg/dL (ref 0.50–1.03)
Globulin: 2.6 g/dL (ref 1.9–3.7)
Glucose, Bld: 68 mg/dL (ref 65–99)
Potassium: 3.7 mmol/L (ref 3.5–5.3)
Sodium: 143 mmol/L (ref 135–146)
Total Bilirubin: 1 mg/dL (ref 0.2–1.2)
Total Protein: 6.2 g/dL (ref 6.1–8.1)
eGFR: 93 mL/min/1.73m2 (ref >=60)

## 2024-08-06 LAB — CBC WITH DIFFERENTIAL/PLATELET
Absolute Lymphocytes: 1793 {cells}/uL (ref 850–3900)
Absolute Monocytes: 451 {cells}/uL (ref 200–950)
Basophils Absolute: 61 {cells}/uL (ref 0–200)
Basophils Relative: 1 %
Eosinophils Absolute: 110 {cells}/uL (ref 15–500)
Eosinophils Relative: 1.8 %
HCT: 39.4 % (ref 35.0–45.0)
Hemoglobin: 13.1 g/dL (ref 11.7–15.5)
MCH: 28.2 pg (ref 27.0–33.0)
MCHC: 33.2 g/dL (ref 32.0–36.0)
MCV: 84.7 fL (ref 80.0–100.0)
MPV: 11.5 fL (ref 7.5–12.5)
Monocytes Relative: 7.4 %
Neutro Abs: 3684 {cells}/uL (ref 1500–7800)
Neutrophils Relative %: 60.4 %
Platelets: 188 Thousand/uL (ref 140–400)
RBC: 4.65 Million/uL (ref 3.80–5.10)
RDW: 12.7 % (ref 11.0–15.0)
Total Lymphocyte: 29.4 %
WBC: 6.1 Thousand/uL (ref 3.8–10.8)

## 2024-08-06 LAB — VITAMIN D 25 HYDROXY (VIT D DEFICIENCY, FRACTURES): Vit D, 25-Hydroxy: 18 ng/mL — ABNORMAL LOW (ref 30–100)

## 2024-08-06 LAB — HEMOGLOBIN A1C
Hgb A1c MFr Bld: 4.8 % (ref <5.7)
Mean Plasma Glucose: 91 mg/dL
eAG (mmol/L): 5 mmol/L

## 2024-08-06 LAB — MICROSCOPIC MESSAGE

## 2024-08-06 LAB — B12 AND FOLATE PANEL
Folate: 12.7 ng/mL
Vitamin B-12: 679 pg/mL (ref 200–1100)

## 2024-08-06 LAB — AMMONIA: Ammonia: 44 umol/L (ref <=72)

## 2024-08-12 ENCOUNTER — Ambulatory Visit

## 2024-08-15 ENCOUNTER — Ambulatory Visit

## 2024-08-15 NOTE — Progress Notes (Deleted)
 Progress Note  Physician: Cleophus Mendonsa A Collier Bohnet, MD   HPI: Julie Graham is a 58 y.o. female presenting on 08/15/2024 for No chief complaint on file. .  Discussed the use of AI scribe software for clinical note transcription with the patient, who gave verbal consent to proceed.  History of Present Illness             Medical history:  Relevant past medical, surgical, family and social history reviewed and updated as indicated. Interim medical history since our last visit reviewed.  Allergies and medications reviewed and updated.   ROS: Negative unless specifically indicated above in HPI.    Current Outpatient Medications:  .  ACCU-CHEK FASTCLIX LANCETS MISC, Check once daily, E16.2, Disp: 102 each, Rfl: 3 .  Ascorbic Acid (VITAMIN C IMMUNE HEALTH PO), Take by mouth. (Patient not taking: Reported on 08/05/2024), Disp: , Rfl:  .  blood glucose meter kit and supplies KIT, Check once daily if feeling lightheaded. E16.2., Disp: 1 each, Rfl: 0 .  Cyanocobalamin (VITAMIN B-12 CR PO), Take by mouth. (Patient not taking: Reported on 07/24/2023), Disp: , Rfl:  .  docusate sodium (COLACE) 100 MG capsule, Take 100 mg by mouth 2 (two) times daily. (Patient not taking: Reported on 07/24/2023), Disp: , Rfl:  .  ferrous sulfate  325 (65 FE) MG tablet, TAKE 1 TABLET BY MOUTH EVERY DAY WITH BREAKFAST, Disp: 90 tablet, Rfl: 1 .  furosemide  (LASIX ) 20 MG tablet, Take 3 tablets (60 mg total) by mouth 2 (two) times daily., Disp: 180 tablet, Rfl: 0 .  furosemide  (LASIX ) 40 MG tablet, Take 40 mg by mouth daily., Disp: , Rfl:  .  gabapentin (NEURONTIN) 100 MG capsule, Take 200 mg by mouth at bedtime., Disp: , Rfl: 3 .  glucose blood test strip, accu chek once daily, E16.2, Disp: 100 each, Rfl: 12 .  Lactulose  20 GM/30ML SOLN, Take 30 mLs (20 g total) by mouth daily., Disp: 30 mL, Rfl: 3 .  magnesium  gluconate 54mg /67ml syringe, Take by mouth., Disp: , Rfl:  .  ondansetron  (ZOFRAN ) 4 MG tablet,  Take 1 tablet (4 mg total) by mouth 2 (two) times daily as needed for nausea or vomiting., Disp: 20 tablet, Rfl: 0 .  rifaximin (XIFAXAN) 550 MG TABS tablet, Take 550 mg by mouth., Disp: , Rfl:  .  spironolactone  (ALDACTONE ) 100 MG tablet, Take 1 tablet (100 mg total) by mouth 2 (two) times daily., Disp: 180 tablet, Rfl: 0  Current Facility-Administered Medications:  .  0.9 %  sodium chloride  infusion, 500 mL, Intravenous, Continuous, Pyrtle, Gordy HERO, MD .  albumin  human 25 % solution 50 g, 50 g, Intravenous, UD, Pyrtle, Gordy HERO, MD       Objective:     LMP 04/07/2022   Wt Readings from Last 3 Encounters:  08/05/24 249 lb (112.9 kg)  07/24/23 249 lb (112.9 kg)  06/19/23 249 lb 3.2 oz (113 kg)    Physical Exam  Physical Exam Vitals reviewed.  Constitutional:      Appearance: Normal appearance. Well-developed with normal weight.  Cardiovascular:     Rate and Rhythm: Normal rate and regular rhythm. Normal heart sounds. Normal peripheral pulses Pulmonary:     Normal breath sounds with normal effort Skin:    General: Skin is warm and dry without noticeable rash. Neurological:     General: No focal deficit present.  Psychiatric:        Mood  and Affect: Mood, behavior and cognition normal      Assessment & Plan:  No diagnosis found.  No orders of the defined types were placed in this encounter.    Assessment and Plan

## 2024-08-29 DIAGNOSIS — K7682 Hepatic encephalopathy    (CMS-HCC): Principal | ICD-10-CM

## 2024-08-29 MED ORDER — RIFAXIMIN 550 MG TABLET
ORAL_TABLET | Freq: Two times a day (BID) | ORAL | 4 refills | 90.00000 days | Status: CP
Start: 2024-08-29 — End: ?

## 2024-09-29 DIAGNOSIS — R601 Generalized edema: Principal | ICD-10-CM

## 2024-09-29 DIAGNOSIS — R059 Cough, unspecified type: Principal | ICD-10-CM

## 2024-09-29 DIAGNOSIS — G629 Polyneuropathy, unspecified: Principal | ICD-10-CM

## 2024-09-29 DIAGNOSIS — K7460 Unspecified cirrhosis of liver: Principal | ICD-10-CM

## 2024-09-29 MED ORDER — GABAPENTIN 100 MG CAPSULE
ORAL_CAPSULE | Freq: Every evening | ORAL | 1 refills | 0.00000 days
Start: 2024-09-29 — End: ?

## 2024-09-30 MED ORDER — GABAPENTIN 100 MG CAPSULE
ORAL_CAPSULE | Freq: Every evening | ORAL | 1 refills | 90.00000 days | Status: CP
Start: 2024-09-30 — End: ?
